# Patient Record
Sex: Male | Born: 1935
Health system: Southern US, Community
[De-identification: ages and names within clinical notes are randomized; demographics above are authoritative.]

## PROBLEM LIST (undated history)

## (undated) DIAGNOSIS — T148XXA Other injury of unspecified body region, initial encounter: Secondary | ICD-10-CM

## (undated) DIAGNOSIS — I739 Peripheral vascular disease, unspecified: Secondary | ICD-10-CM

## (undated) DIAGNOSIS — K2270 Barrett's esophagus without dysplasia: Secondary | ICD-10-CM

## (undated) DIAGNOSIS — D649 Anemia, unspecified: Secondary | ICD-10-CM

## (undated) DIAGNOSIS — F419 Anxiety disorder, unspecified: Secondary | ICD-10-CM

## (undated) DIAGNOSIS — K635 Polyp of colon: Secondary | ICD-10-CM

## (undated) DIAGNOSIS — H269 Unspecified cataract: Secondary | ICD-10-CM

## (undated) DIAGNOSIS — F32A Depression, unspecified: Secondary | ICD-10-CM

## (undated) DIAGNOSIS — M1990 Unspecified osteoarthritis, unspecified site: Secondary | ICD-10-CM

## (undated) DIAGNOSIS — F329 Major depressive disorder, single episode, unspecified: Secondary | ICD-10-CM

## (undated) DIAGNOSIS — I10 Essential (primary) hypertension: Secondary | ICD-10-CM

## (undated) DIAGNOSIS — E7800 Pure hypercholesterolemia, unspecified: Secondary | ICD-10-CM

## (undated) DIAGNOSIS — K449 Diaphragmatic hernia without obstruction or gangrene: Secondary | ICD-10-CM

## (undated) DIAGNOSIS — K5790 Diverticulosis of intestine, part unspecified, without perforation or abscess without bleeding: Secondary | ICD-10-CM

## (undated) DIAGNOSIS — E039 Hypothyroidism, unspecified: Secondary | ICD-10-CM

## (undated) DIAGNOSIS — K219 Gastro-esophageal reflux disease without esophagitis: Secondary | ICD-10-CM

## (undated) DIAGNOSIS — N189 Chronic kidney disease, unspecified: Secondary | ICD-10-CM

## (undated) HISTORY — DX: Unspecified osteoarthritis, unspecified site: M19.90

## (undated) HISTORY — DX: Diaphragmatic hernia without obstruction or gangrene: K44.9

## (undated) HISTORY — PX: PYLOROPLASTY: SHX418

## (undated) HISTORY — DX: Anxiety disorder, unspecified: F41.9

## (undated) HISTORY — DX: Depression, unspecified: F32.A

## (undated) HISTORY — DX: Diverticulosis of intestine, part unspecified, without perforation or abscess without bleeding: K57.90

## (undated) HISTORY — PX: TRANSURETHRAL RESECTION OF PROSTATE: SHX73

## (undated) HISTORY — DX: Unspecified cataract: H26.9

## (undated) HISTORY — DX: Barrett's esophagus without dysplasia: K22.70

## (undated) HISTORY — PX: CATARACT EXTRACTION W/ INTRAOCULAR LENS  IMPLANT, BILATERAL: SHX1307

## (undated) HISTORY — DX: Chronic kidney disease, unspecified: N18.9

## (undated) HISTORY — DX: Anemia, unspecified: D64.9

## (undated) HISTORY — PX: EYE SURGERY: SHX253

## (undated) HISTORY — PX: UPPER GASTROINTESTINAL ENDOSCOPY: SHX188

## (undated) HISTORY — DX: Gastro-esophageal reflux disease without esophagitis: K21.9

## (undated) HISTORY — DX: Major depressive disorder, single episode, unspecified: F32.9

## (undated) HISTORY — PX: CARDIAC CATHETERIZATION: SHX172

## (undated) HISTORY — DX: Other injury of unspecified body region, initial encounter: T14.8XXA

## (undated) HISTORY — DX: Essential (primary) hypertension: I10

## (undated) HISTORY — PX: TONSILLECTOMY: SUR1361

## (undated) HISTORY — PX: POLYPECTOMY: SHX149

## (undated) HISTORY — DX: Polyp of colon: K63.5

## (undated) HISTORY — PX: COLONOSCOPY: SHX174

---

## 1999-07-10 ENCOUNTER — Encounter: Payer: Self-pay | Admitting: Family Medicine

## 1999-07-10 ENCOUNTER — Encounter: Admission: RE | Admit: 1999-07-10 | Discharge: 1999-07-10 | Payer: Self-pay | Admitting: Family Medicine

## 2000-05-22 ENCOUNTER — Encounter: Payer: Self-pay | Admitting: Family Medicine

## 2000-05-22 ENCOUNTER — Encounter: Admission: RE | Admit: 2000-05-22 | Discharge: 2000-05-22 | Payer: Self-pay | Admitting: Family Medicine

## 2001-11-04 ENCOUNTER — Encounter: Admission: RE | Admit: 2001-11-04 | Discharge: 2001-11-04 | Payer: Self-pay | Admitting: Family Medicine

## 2001-11-04 ENCOUNTER — Encounter: Payer: Self-pay | Admitting: Family Medicine

## 2002-01-14 ENCOUNTER — Encounter: Admission: RE | Admit: 2002-01-14 | Discharge: 2002-01-14 | Payer: Self-pay | Admitting: Internal Medicine

## 2002-01-14 ENCOUNTER — Encounter: Payer: Self-pay | Admitting: Internal Medicine

## 2002-04-01 ENCOUNTER — Encounter: Payer: Self-pay | Admitting: Emergency Medicine

## 2002-04-01 ENCOUNTER — Inpatient Hospital Stay (HOSPITAL_COMMUNITY): Admission: EM | Admit: 2002-04-01 | Discharge: 2002-04-02 | Payer: Self-pay | Admitting: Emergency Medicine

## 2004-07-12 ENCOUNTER — Encounter (INDEPENDENT_AMBULATORY_CARE_PROVIDER_SITE_OTHER): Payer: Self-pay | Admitting: Specialist

## 2004-07-12 ENCOUNTER — Inpatient Hospital Stay (HOSPITAL_COMMUNITY): Admission: RE | Admit: 2004-07-12 | Discharge: 2004-07-14 | Payer: Self-pay | Admitting: Urology

## 2005-09-16 ENCOUNTER — Emergency Department (HOSPITAL_COMMUNITY): Admission: EM | Admit: 2005-09-16 | Discharge: 2005-09-16 | Payer: Self-pay | Admitting: Emergency Medicine

## 2005-12-10 ENCOUNTER — Encounter: Admission: RE | Admit: 2005-12-10 | Discharge: 2005-12-10 | Payer: Self-pay | Admitting: Family Medicine

## 2005-12-17 ENCOUNTER — Ambulatory Visit: Payer: Self-pay | Admitting: Internal Medicine

## 2005-12-18 ENCOUNTER — Ambulatory Visit: Payer: Self-pay | Admitting: Internal Medicine

## 2005-12-18 ENCOUNTER — Encounter (INDEPENDENT_AMBULATORY_CARE_PROVIDER_SITE_OTHER): Payer: Self-pay | Admitting: *Deleted

## 2007-03-16 ENCOUNTER — Ambulatory Visit: Payer: Self-pay | Admitting: Internal Medicine

## 2007-04-28 ENCOUNTER — Encounter: Payer: Self-pay | Admitting: Internal Medicine

## 2007-04-28 ENCOUNTER — Ambulatory Visit: Payer: Self-pay | Admitting: Internal Medicine

## 2007-06-09 DIAGNOSIS — M129 Arthropathy, unspecified: Secondary | ICD-10-CM | POA: Insufficient documentation

## 2007-06-09 DIAGNOSIS — K219 Gastro-esophageal reflux disease without esophagitis: Secondary | ICD-10-CM | POA: Insufficient documentation

## 2007-06-09 DIAGNOSIS — I10 Essential (primary) hypertension: Secondary | ICD-10-CM

## 2007-06-09 DIAGNOSIS — D126 Benign neoplasm of colon, unspecified: Secondary | ICD-10-CM

## 2007-06-09 DIAGNOSIS — K449 Diaphragmatic hernia without obstruction or gangrene: Secondary | ICD-10-CM | POA: Insufficient documentation

## 2007-06-09 DIAGNOSIS — K2270 Barrett's esophagus without dysplasia: Secondary | ICD-10-CM

## 2007-06-09 DIAGNOSIS — E039 Hypothyroidism, unspecified: Secondary | ICD-10-CM | POA: Insufficient documentation

## 2007-06-09 DIAGNOSIS — K644 Residual hemorrhoidal skin tags: Secondary | ICD-10-CM

## 2007-06-09 DIAGNOSIS — F329 Major depressive disorder, single episode, unspecified: Secondary | ICD-10-CM

## 2007-06-09 DIAGNOSIS — K5730 Diverticulosis of large intestine without perforation or abscess without bleeding: Secondary | ICD-10-CM | POA: Insufficient documentation

## 2007-06-10 ENCOUNTER — Ambulatory Visit: Payer: Self-pay | Admitting: Internal Medicine

## 2007-10-26 ENCOUNTER — Ambulatory Visit: Payer: Self-pay | Admitting: Internal Medicine

## 2007-10-26 DIAGNOSIS — R079 Chest pain, unspecified: Secondary | ICD-10-CM

## 2007-10-28 ENCOUNTER — Encounter: Payer: Self-pay | Admitting: Internal Medicine

## 2007-10-28 ENCOUNTER — Ambulatory Visit: Payer: Self-pay | Admitting: Internal Medicine

## 2007-10-29 ENCOUNTER — Telehealth: Payer: Self-pay | Admitting: Internal Medicine

## 2007-11-02 ENCOUNTER — Encounter: Payer: Self-pay | Admitting: Internal Medicine

## 2008-03-14 ENCOUNTER — Inpatient Hospital Stay (HOSPITAL_COMMUNITY): Admission: EM | Admit: 2008-03-14 | Discharge: 2008-03-16 | Payer: Self-pay | Admitting: Emergency Medicine

## 2008-03-24 ENCOUNTER — Encounter (INDEPENDENT_AMBULATORY_CARE_PROVIDER_SITE_OTHER): Payer: Self-pay | Admitting: *Deleted

## 2009-02-21 ENCOUNTER — Telehealth: Payer: Self-pay | Admitting: Internal Medicine

## 2009-04-13 ENCOUNTER — Telehealth: Payer: Self-pay | Admitting: Internal Medicine

## 2009-05-04 ENCOUNTER — Ambulatory Visit: Payer: Self-pay | Admitting: Internal Medicine

## 2009-05-04 DIAGNOSIS — R1084 Generalized abdominal pain: Secondary | ICD-10-CM | POA: Insufficient documentation

## 2009-05-04 DIAGNOSIS — R634 Abnormal weight loss: Secondary | ICD-10-CM

## 2009-05-05 LAB — CONVERTED CEMR LAB
BUN: 20 mg/dL (ref 6–23)
Creatinine, Ser: 1.6 mg/dL — ABNORMAL HIGH (ref 0.4–1.5)

## 2009-05-08 ENCOUNTER — Ambulatory Visit: Payer: Self-pay | Admitting: Cardiology

## 2009-05-29 ENCOUNTER — Ambulatory Visit: Payer: Self-pay | Admitting: Internal Medicine

## 2009-05-31 ENCOUNTER — Encounter: Payer: Self-pay | Admitting: Internal Medicine

## 2009-07-12 ENCOUNTER — Ambulatory Visit: Payer: Self-pay | Admitting: Internal Medicine

## 2009-09-06 DIAGNOSIS — T148XXA Other injury of unspecified body region, initial encounter: Secondary | ICD-10-CM | POA: Insufficient documentation

## 2009-10-20 ENCOUNTER — Encounter (INDEPENDENT_AMBULATORY_CARE_PROVIDER_SITE_OTHER): Payer: Self-pay | Admitting: *Deleted

## 2010-02-06 NOTE — Progress Notes (Signed)
Summary: Triage   Phone Note Call from Patient Call back at [phone]   Caller: Patient Call For: Dr. Marina Goodell Reason for Call: Talk to Nurse Summary of Call: Constipation, Lower abd. pain and gas espicially after eating. would like a sooner appt. than first avail 05-04-09 Initial call taken by: Karna Christmas,  April 13, 2009 10:34 AM  Follow-up for Phone Call        Given appt. for 05/04/09 and  put on cx. list Follow-up by: Teryl Lucy RN,  April 13, 2009 10:50 AM

## 2010-02-06 NOTE — Letter (Signed)
Summary: Patient Notice-[facility]  [address] [place], [place] [address]   Phone: [phone]  Fax: [phone]        May 31, 2009 MRN: [id number]    Walter [name] [address] [place], [place]  [address]    Dear Mr. Hillock,  I am pleased to inform you that the biopsies taken during your recent endoscopic examination did not show any evidence of cancer upon pathologic examination.  However,as you know, your biopsies indicate you have a condition known as Barrett's esophagus. Furthermore, the biopsies continued to show low-grade glandular dysplasia. While not cancer, it is pre-cancerous (can progress to cancer) and needs to be monitored with repeat endoscopic examination and biopsies.  Fortunately, it is uncommon that this develops into cancer, but careful monitoring of the condition along with taking your medication as prescribed is very important in reducing the risk of developing cancer.  It is my recommendation that you have a repeat upper gastrointestinal endoscopic examination in ONE year.  Additional information/recommendations:  __Please call [phone] to schedule a return visit in 4-6 weeks to further evaluate your condition, as previously recommended on your       endoscopy report.  __Continue omeprazole 40 mg daily   Please call us if you have or develop heartburn, reflux symptoms, any swallowing problems, or if you have questions about your condition that have not been fully answered at this time.  Sincerely,  Hilarie Fredrickson MD  This letter has been electronically signed by your physician.  Appended Document: Patient Notice-Barrett's Esopghagus letter mailed.

## 2010-02-06 NOTE — Assessment & Plan Note (Signed)
Summary: Abdominal pain, constipation / gas    History of Present Illness Visit Type: Follow-up Visit Primary GI MD: Yancey Flemings MD Primary Provider: Dr. Lupe Carney Requesting Provider: n/a Chief Complaint: consitpation/diarrhea  lower abdominal pain History of Present Illness:   77 year old white male with a history of hypothyroidism, hypertension, depression, gastroesophageal reflux disease complicated by Barrett's esophagus, diverticulosis, and adenomatous colon polyps. He presents today, one year overdue for followup, with new complaints of postprandial abdominal discomfort and weight loss. The patient last underwent complete colonoscopy in December of 2007. Diverticulosis and adenomatous polyp (removed) found. Followup in 5 years recommended. He is also known to have Barrett's esophagus, and has undergone multiple endoscopies due to the presence of low-grade dysplasia and most recently glandular atypia in October 2009. At that time he was instructed to continue her Nexium 40 mg b.i.d. and followup endoscopy in 6 months. He tells me that he had difficulties with insurance related issues and inability to manage his medical bills. As such, he did not respond to the followup letter and went off of his Nexium. He tells me that he takes Prevacid 15 mg daily (over-the-counter) for GERD. He denies heartburn or dysphagia. Currently, he reports that about one hour after meals he started to develop lower abdominal discomfort. This has led to food avoidance. He reports 10 pound weight loss over the past month. Records show that he has had 20 pound weight loss since his last office visit October 2009. Discomfort seems to clear on and eventually wane. No nocturnal symptoms. He also reports alternating bowel habits with constipation and diarrhea. Pain is in the lower abdomen. It's not clear that defecation improves his symptoms.. No back pain.   GI Review of Systems    Reports abdominal pain, acid reflux, and   weight loss.     Location of  Abdominal pain: lower abdomen. Weight loss of 10 pounds over one month.   Denies belching, bloating, chest pain, dysphagia with liquids, dysphagia with solids, heartburn, loss of appetite, nausea, vomiting, vomiting blood, and  weight gain.      Reports change in bowel habits, constipation, diarrhea, and  diverticulosis.     Denies anal fissure, black tarry stools, fecal incontinence, heme positive stool, hemorrhoids, irritable bowel syndrome, jaundice, light color stool, liver problems, rectal bleeding, and  rectal pain.    Current Medications (verified): 1)  Prevacid 15 Mg Cpdr (Lansoprazole) .Marland Kitchen.. 1 By Mouth Once Daily 2)  Synthroid 150 Mcg Tabs (Levothyroxine Sodium) .Marland Kitchen.. 1 By Mouth Once Daily 3)  Flomax 0.4 Mg  Cp24 (Tamsulosin Hcl) .... One Tablet Once Daily 4)  Finasteride 5 Mg Tabs (Finasteride) .Marland Kitchen.. 1 Tablet By Mouth Once Daily 5)  Metoprolol Succinate 25 Mg Xr24h-Tab (Metoprolol Succinate) .Marland Kitchen.. 1 By Mouth Once Daily 6)  Fluoxetine Hcl 20 Mg Caps (Fluoxetine Hcl) .Marland Kitchen.. 1 By Mouth Once Daily 7)  Diclofenac Sodium 75 Mg Tbec (Diclofenac Sodium) .Marland Kitchen.. 1 By Mouth Once Daily  Allergies (verified): 1)  ! Penicillin  Past History:  Past Medical History: Reviewed history from 06/09/2007 and no changes required. Current Problems:  HIATAL HERNIA (ICD-553.3) GERD (ICD-530.81) RESIDUAL HEMORRHOIDAL SKIN TAGS (ICD-455.9) ARTHRITIS (ICD-716.90) DIVERTICULOSIS, COLON (ICD-562.10) COLONIC POLYPS (ICD-211.3) BARRETTS ESOPHAGUS (ICD-530.85) HYPERTENSION (ICD-401.9) DEPRESSION (ICD-311) HYPOTHYROIDISM (ICD-244.9)  Past Surgical History: Reviewed history from 06/09/2007 and no changes required. Larynx surgery  Family History: Reviewed history from 06/10/2007 and no changes required. no family history of gastrointestinal malignancy. Mother with heart disease. Father with melanoma. Brother and sister with  lung cancer. 2 sisters with Alzheimer's.  Social  History: Reviewed history from 06/10/2007 and no changes required. remarried. 2 children from his first marriage. Attend college. Employed. Does not smoke. Drinks alcohol daily.  Review of Systems  The patient denies allergy/sinus, anemia, anxiety-new, arthritis/joint pain, back pain, blood in urine, breast changes/lumps, change in vision, confusion, cough, coughing up blood, depression-new, fainting, fatigue, fever, headaches-new, hearing problems, heart murmur, heart rhythm changes, itching, menstrual pain, muscle pains/cramps, night sweats, nosebleeds, pregnancy symptoms, shortness of breath, skin rash, sleeping problems, sore throat, swelling of feet/legs, swollen lymph glands, thirst - excessive , urination - excessive , urination changes/pain, urine leakage, vision changes, and voice change.    Vital Signs:  Patient profile:   77 year old male Height:      69 inches Weight:      198 pounds BMI:     29.35 Pulse rate:   66 / minute Pulse rhythm:   regular BP sitting:   100 / 54  (left arm)  Vitals Entered By: Chales Abrahams CMA Duncan Dull) (May 04, 2009 11:13 AM)  Physical Exam  General:  Well developed, well nourished, no acute distress. Head:  Normocephalic and atraumatic. Eyes:  PERRLA, no icterus. Nose:  No deformity, discharge,  or lesions. Mouth:  No deformity or lesions, dentition normal. Neck:  Supple; no masses or thyromegaly. Chest Wall:  Symmetrical,  no deformities . Lungs:  Clear throughout to auscultation. Heart:  Regular rate and rhythm; no murmurs, rubs,  or bruits. Abdomen:  Soft, nontender and nondistended. No masses, hepatosplenomegaly or hernias noted. Normal bowel sounds. Rectal:  deferred Msk:  Symmetrical with no gross deformities. Normal posture. Pulses:  Normal pulses noted. Extremities:  No clubbing, cyanosis, edema or deformities noted. Neurologic:  Alert and  oriented x4;  grossly normal neurologically. Skin:  Intact without significant lesions or  rashes. Cervical Nodes:  No significant cervical adenopathy.no supraclavicular adenopathy Psych:  Alert and cooperative. Normal mood and affect.   Impression & Recommendations:  Problem # 1:  ABDOMINAL PAIN -GENERALIZED (ICD-789.07) postprandial normal pain associated with weight loss. Rule out mesenteric ischemia, pancreatic cancer, gastric ulcer.  Plan: #1. Contrast-enhanced CT scan of the abdomen and pelvis to rule out mesenteric ischemia as well as pancreatic neoplasm or other process to explain symptoms.  Problem # 2:  BARRETTS ESOPHAGUS (ICD-530.85) Barrett's esophagus with a history of low-grade dysplasia and most recently atypia. One year overdue for surveillance endoscopy. Weight loss noted, though no dysphagia. Inadequate dose of PPI.  Plan: #1. Prescribe omeprazole 40 mg daily #2. Upper endoscopy with esophageal biopsies. The nature of the procedure as well as the risks, benefits, and alternatives were reviewed. He understood and agreed to proceed. as well, endoscopy to evaluate pain if CT negative  Problem # 3:  WEIGHT LOSS-ABNORMAL (ICD-783.21) weight loss due to food avoidance. Workup as above. Prior history of significant anxiety/depression noted  Problem # 4:  GERD (ICD-530.81) complicated by Barrett's esophagus. Over due for surveillance endoscopy. Inadequate PPI dosage. Plan for surveillance endoscopy and changed to omeprazole 40 mg daily  Problem # 5:  COLONIC POLYPS (ICD-211.3) history of adenomatous colon polyps. Now with change in bowel habits (787.99) as manifested by constipation and diarrhea. Initially due for colonoscopy in December of 2012. However, if workup negative then consider moving colonoscopy up to evaluate change in bowel habits, or abdominal pain, and weight loss.  Other Orders: CT Abdomen/Pelvis w & w/o Contrast (CT A/P w&w/o Cont) EGD (EGD) TLB-BUN (Urea Nitrogen) (  84520-BUN) TLB-Creatinine, Blood (82565-CREA)  Patient Instructions: 1)  EGD  LEC 05/29/09 4:00 pm arrive at 3:00 pm 2)  Upper Endoscopy brochure given.  3)  CT Abdomen with and without contrast Pelvis with contrast 4)  Omeprazole 40 mg #90 x 3 RFS. 5)  Labs ordered for you to have drawn today. 6)  The medication list was reviewed and reconciled.  All changed / newly prescribed medications were explained.  A complete medication list was provided to the patient / caregiver. 7)  printed and given to patient. Milford Cage [facility]  May 04, 2009 12:09 PM 8)  Copy: Dr. Lupe Carney Prescriptions: OMEPRAZOLE 40 MG CPDR (OMEPRAZOLE) 1 by mouth once daily  #90 x 3   Entered by:   Milford Cage NCMA   Authorized by:   Hilarie Fredrickson MD   Signed by:   Milford Cage NCMA on 05/04/2009   Method used:   Electronically to        [facility]* (mail-order)             ,          Ph: [phone]       Fax: [phone]   RxID:   [id number]

## 2010-02-06 NOTE — Procedures (Signed)
Summary: EGD   EGD  Procedure date:  12/18/2005  Findings:      Location: [facility]  Findings: Duodenitis  Hiatal Hernia Barrett's Esophagus [name] Name: Walter, [name]. MRN:  Procedure Procedures: Panendoscopy (EGD) CPT: 43235.    with multiple biopsies. CPT: T6462574. There were greater than 10 biopsies taken.  Personnel: Endoscopist: Wilhemina Bonito. Marina Goodell, MD.  Exam Location: Exam performed in Outpatient Clinic. Outpatient  [name] Consent: Procedure, Alternatives, Risks and Benefits discussed, consent obtained, from [name]. Consent was obtained by the RN.  Indications Symptoms: Dysphagia. Abdominal pain, Reflux symptoms  History  Current Medications: [name] is not currently taking Coumadin.  Pre-Exam Physical: Performed Dec 18, 2005  Cardio-pulmonary exam, HEENT exam, Abdominal exam, Mental status exam WNL.  Comments: Pt. history reviewed/updated, physical exam performed prior to initiation of sedation?yes Exam Exam Info: Maximum depth of insertion Duodenum, intended Duodenum. [name] position: on left side. Vocal cords visualized. Gastric retroflexion performed. Images taken. ASA Classification: II. Tolerance: excellent.  Sedation Meds: [name] assessed and found to be appropriate for moderate (conscious) sedation. Residual sedation present from prior procedure today. Versed 2 mg. given IV. Cetacaine Spray 2 sprays  Monitoring: BP and pulse monitoring done. Oximetry used. Supplemental O2 given  Findings - BARRETT'S ESOPHAGUS: Proximal margin 25 cm from mouth,  distal margin 34 cm. Length of Barrett's 9 cm. Inflammation present. A retroflex image was taken. Biopsy/Barrett's taken. ICD9: Barrett's: 530.2. Comment: 4Q bx x q2cm (total 16 bx).  HIATAL HERNIA: ICD9: Hernia, Hiatal: 553.3. - MUCOSAL ABNORMALITY: Duodenal Bulb. Erosions present. Erythematous mucosa. RUT done, results pending. ICD9: Duodenitis without Hemorrhage: 535.60.    Assessment  Diagnoses: 530.2: Barrett's.  553.3: Hernia, Hiatal.  535.60: Duodenitis without Hemorrhage.   Events  Unplanned Intervention: No unplanned interventions were required.  Unplanned Events: There were no complications. Plans Comments: Continue daily PPI Disposition: After procedure [name] sent to recovery. After recovery [name] sent home.  Scheduling: Call office for appointment, to Wilhemina Bonito. Marina Goodell, MD, in 4 weeks   Comments: Return to Dr. Clovis Riley ASAP to address MULTIPLE nonGI complaints.  This report was created from Walter original endoscopy report, which was reviewed and signed by Walter above listed endoscopist.   cc:  Lupe Carney, MD      Marcelyn Bruins, MD      Walter [name]

## 2010-02-06 NOTE — Letter (Signed)
Summary: EGD Instructions  [facility]  [address] [place], [place] [address]   Phone: [phone]  Fax: [phone]       Walter [name]    Oct 08, 1933    MRN: [id number]       Procedure Day /Date:MONDAY, 05/29/09     Arrival Time: 3:00 PM     Procedure Time:4:00 PM     Location of Procedure:                    [facility] (4th Floor)   PREPARATION FOR ENDOSCOPY   On MONDAY 5/23/11THE DAY OF THE PROCEDURE:  1.   No solid foods, milk or milk products are allowed after midnight the night before your procedure.  2.   Do not drink anything colored red or purple.  Avoid juices with pulp.  No orange juice.  3.  You may drink clear liquids until 2:00 PM, which is 2 hours before your procedure.                                                                                                CLEAR LIQUIDS INCLUDE: Water Jello Ice Popsicles Tea (sugar ok, no milk/cream) Powdered fruit flavored drinks Coffee (sugar ok, no milk/cream) Gatorade Juice: apple, white grape, white cranberry  Lemonade Clear bullion, consomm, broth Carbonated beverages (any kind) Strained chicken noodle soup Hard Candy   MEDICATION INSTRUCTIONS  Unless otherwise instructed, you should take regular prescription medications with a small sip of water as early as possible the morning of your procedure.             OTHER INSTRUCTIONS  You will need a responsible adult at least 75 years of age to accompany you and drive you home.   This person must remain in the waiting room during your procedure.  Wear loose fitting clothing that is easily removed.  Leave jewelry and other valuables at home.  However, you may wish to bring a book to read or an iPod/MP3 player to listen to music as you wait for your procedure to start.  Remove all body piercing jewelry and leave at home.  Total time from sign-in until discharge is approximately 2-3 hours.  You should go home  directly after your procedure and rest.  You can resume normal activities the day after your procedure.  The day of your procedure you should not:   Drive   Make legal decisions   Operate machinery   Drink alcohol   Return to work  You will receive specific instructions about eating, activities and medications before you leave.    The above instructions have been reviewed and explained to me by   _______________________    I fully understand and can verbalize these instructions _____________________________ Date _________

## 2010-02-06 NOTE — Progress Notes (Signed)
Summary: Schedule EGD   Phone Note Outgoing Call Call back at [facility] Phone [phone]   Call placed by: Harlow Mares CMA Duncan Dull),  February 21, 2009 9:06 AM Call placed to: Patient Summary of Call: Left message on patients machine to call back. patient needs to schedule a egd not a colonoscopy. Recall colonoscopy not due until 2012 Initial call taken by: Harlow Mares CMA Duncan Dull),  February 21, 2009 9:07 AM  Follow-up for Phone Call        Left message on patients machine to call back. patient needs EGD only Follow-up by: Harlow Mares CMA Duncan Dull),  March 01, 2009 12:35 PM

## 2010-02-06 NOTE — Letter (Signed)
Summary: Appointment - Missed  [facility], Main Office  [address]   [place], [place] [address]   Phone: [phone]  Fax: [phone]        October 20, 2009 MRN: [id number]      Jacques FERRANTE [address] [place], [place]  [address]     Dear Mr. Helfrich,  Our records indicate you missed your appointment on September 07, 2009 at 3:45pm with Dr. Eden Emms. It is very important that we reach you to reschedule this appointment. We look forward to participating in your health care needs. Please contact us at the number listed above at your earliest convenience to reschedule this appointment.     Sincerely,  Glass blower/designer

## 2010-02-06 NOTE — Assessment & Plan Note (Signed)
Summary: followup post EGD / feeling better    History of Present Illness Visit Type: follow up Primary GI MD: Yancey Flemings MD Primary Provider: Dr. Lupe Carney Requesting Provider: n/a Chief Complaint: f/u EGD. Pt states Dr. Clovis Riley put him on a medicine that he does not know the name of and this has relieved his symptoms completely. History of Present Illness:   77 year old white male with hypothyroidism, hypertension, anxiety/depression, GERD complicated by Barrett's esophagus, diverticulosis, and adenomatous colon polyps. He was evaluated in this office May 04, 2009 regarding abdominal pain and weight loss. He was over due for Barrett surveillance. He had been off PPI. He was prescribed omeprazole 40 mg daily. Upper endoscopy was performed. Biopsies revealed Barrett's with changes of low-grade dysplasia and some specimens. One-year followup plans. CT Scan of the abdomen and pelvis was negative. He saw Dr. Clovis Riley and was placed on dicyclomine. He takes this once a day prior to his evening meal. He states that his abdomen has been feeling better. His appetite has improved. He has gained 5 pounds. No lower GI complaints. He is pleased. He is due for surveillance colonoscopy December 2012. He does tell me that he has had some irregular heartbeats and wishes to see a [place] cardiologist.   GI Review of Systems    Reports acid reflux and  weight gain.      Denies abdominal pain, belching, bloating, chest pain, dysphagia with liquids, dysphagia with solids, heartburn, loss of appetite, nausea, vomiting, vomiting blood, and  weight loss.        Denies anal fissure, black tarry stools, change in bowel habit, constipation, diarrhea, diverticulosis, fecal incontinence, heme positive stool, hemorrhoids, irritable bowel syndrome, jaundice, light color stool, liver problems, rectal bleeding, and  rectal pain.    Current Medications (verified): 1)  Synthroid 150 Mcg Tabs (Levothyroxine Sodium) .Marland Kitchen..  1 By Mouth Once Daily 2)  Flomax 0.4 Mg  Cp24 (Tamsulosin Hcl) .... One Tablet Once Daily 3)  Finasteride 5 Mg Tabs (Finasteride) .Marland Kitchen.. 1 Tablet By Mouth Once Daily 4)  Metoprolol Succinate 25 Mg Xr24h-Tab (Metoprolol Succinate) .Marland Kitchen.. 1 By Mouth Once Daily 5)  Fluoxetine Hcl 20 Mg Caps (Fluoxetine Hcl) .Marland Kitchen.. 1 By Mouth Once Daily 6)  Diclofenac Sodium 75 Mg Tbec (Diclofenac Sodium) .Marland Kitchen.. 1 By Mouth Once Daily 7)  Omeprazole 40 Mg Cpdr (Omeprazole) .Marland Kitchen.. 1 By Mouth Once Daily 8)  Dicyclomine Hcl 10 Mg Caps (Dicyclomine Hcl) .... One Tablet By Mouth Four Times A Day As Needed  Allergies (verified): 1)  ! Penicillin  Past History:  Past Medical History: Reviewed history from 06/09/2007 and no changes required. Current Problems:  HIATAL HERNIA (ICD-553.3) GERD (ICD-530.81) RESIDUAL HEMORRHOIDAL SKIN TAGS (ICD-455.9) ARTHRITIS (ICD-716.90) DIVERTICULOSIS, COLON (ICD-562.10) COLONIC POLYPS (ICD-211.3) BARRETTS ESOPHAGUS (ICD-530.85) HYPERTENSION (ICD-401.9) DEPRESSION (ICD-311) HYPOTHYROIDISM (ICD-244.9)  Past Surgical History: Reviewed history from 06/09/2007 and no changes required. Larynx surgery  Family History: Reviewed history from 06/10/2007 and no changes required. no family history of gastrointestinal malignancy. Mother with heart disease. Father with melanoma. Brother and sister with lung cancer. 2 sisters with Alzheimer's.  Social History: Reviewed history from 06/10/2007 and no changes required. remarried. 2 children from his first marriage. Attend college. Employed. Does not smoke. Drinks alcohol daily.  Review of Systems       The patient complains of heart rhythm changes.  The patient denies allergy/sinus, anemia, anxiety-new, arthritis/joint pain, back pain, blood in urine, breast changes/lumps, change in vision, confusion, cough, coughing up blood, depression-new, fainting, fatigue,  fever, headaches-new, hearing problems, heart murmur, itching, menstrual pain, muscle  pains/cramps, night sweats, nosebleeds, pregnancy symptoms, shortness of breath, skin rash, sleeping problems, sore throat, swelling of feet/legs, swollen lymph glands, thirst - excessive , urination - excessive , urination changes/pain, urine leakage, vision changes, and voice change.    Vital Signs:  Patient profile:   77 year old male Height:      69 inches Weight:      202.25 pounds Pulse rate:   70 / minute Pulse rhythm:   regular BP sitting:   116 / 62  (left arm) Cuff size:   regular  Vitals Entered By: Christie Nottingham CMA Duncan Dull) (July 12, 2009 3:33 PM)  Physical Exam  General:  Well developed, well nourished, no acute distress. Head:  Normocephalic and atraumatic. Eyes:  PERRLA, no icterus. Nose:  No deformity, discharge,  or lesions. Mouth:  No deformity or lesions Lungs:  Clear throughout to auscultation. Heart:  Regular rate and rhythm; no murmurs, rubs,  or bruits. Abdomen:  Soft, nontender and nondistended. No masses, hepatosplenomegaly or hernias noted. Normal bowel sounds. Msk:  Symmetrical with no gross deformities. Normal posture. Pulses:  Normal pulses noted. Extremities:  No  edema or deformities noted. Neurologic:  Alert and  oriented x4 Skin:  Intact without significant lesions or rashes. Psych:  Alert and cooperative. Normal mood and affect.   Impression & Recommendations:  Problem # 1:  ABDOMINAL PAIN -GENERALIZED (ICD-789.07) resolved. May have been secondary to incompletely treated GERD or functional. He can continue dicyclomine p.r.n. Return to this office for recurrent problems.  Problem # 2:  WEIGHT LOSS-ABNORMAL (ICD-783.21) resolved. Differential as above.  Problem # 3:  GERD (ICD-530.81) no active symptoms on omeprazole. Continue omeprazole reflux precautions  Problem # 4:  BARRETTS ESOPHAGUS (ICD-530.85) again with some low-grade dysplasia noted. Continue 40 mg of omeprazole. Keep surveillance interval at one year  Problem # 5:  COLONIC  POLYPS (ICD-211.3) due for followup around December 2012  Problem # 6:  irregular heartbeat the patient states that his wife notices irregular heartbeat well he sleeps. She is concerned and would like him to see a cardiologist. He would like to see a [facility] cardiologist. We will make the referral accordingly  Other Orders: Cardiology Referral (Cardiology)  Patient Instructions: 1)  Referral to [facility] Dr. Theron Arista NIshan-08/09/09 8:15 am. 2)  The medication list was reviewed and reconciled.  All changed / newly prescribed medications were explained.  A complete medication list was provided to the patient / caregiver. 3)  Copy: Dr. Adelene Amas

## 2010-02-06 NOTE — Procedures (Signed)
Summary: Upper Endoscopy  Patient: Walter [name] Note: All result statuses are Final unless otherwise noted.  Tests: (1) Upper Endoscopy (EGD)   EGD Upper Endoscopy       DONE     [facility]     [address]     [place], [place]  [address]           ENDOSCOPY PROCEDURE REPORT           PATIENT:  [name], [name]  MR#:  [id number]     BIRTHDATE:  [birth year], 75 yrs. old  GENDER:  male           ENDOSCOPIST:  Wilhemina Bonito. Eda Keys, MD     Referred by:  Surveillance Program Recall,           PROCEDURE DATE:  05/29/2009     PROCEDURE:  EGD with biopsies     ASA CLASS:  Class II     INDICATIONS:  h/o Barrett's Esophagus (low grade dysplasia on last     EGD 10-2007), abdominal pain, weight loss           MEDICATIONS:   Fentanyl 50 mcg IV, Versed 5 mg IV     TOPICAL ANESTHETIC:  Exactacain Spray           DESCRIPTION OF PROCEDURE:   After the risks benefits and     alternatives of the procedure were thoroughly explained, informed     consent was obtained.  The LB GIF-H180 [id number] endoscope was     introduced through the mouth and advanced to the second portion of     the duodenum, without limitations.  The instrument was slowly     withdrawn as the mucosa was fully examined.     <<PROCEDUREIMAGES>>           7cm segment of Barrett's esophagus was found in the distal     esophagus.No active inflammation, erosions, or nodularity.     Multiple bx every cm for a total of 32 bx (submitted in 4 jars).     Erythema was found in the gastric antrum but, Otherwise the     examination was normal to D2.    Retroflexed views revealed a     small hiatal hernia.    The scope was then withdrawn from the     patient and the procedure completed.           COMPLICATIONS:  None           ENDOSCOPIC IMPRESSION:     1) Barrett's esophagus in the distal esophagus - s/p bx     2) Erythema in the antrum     3) Otherwise normal examination     4) A hiatal hernia     5) Gerd     RECOMMENDATIONS:    1) Await biopsy results to determine follow up time for EGD     2) continue PPI for GERD and Barrett's     3) Call office next 2-3 days to schedule an office appointment     for 4-6 weeks           ______________________________     Wilhemina Bonito. Eda Keys, MD           CC:  The Patient           n.     eSIGNED:   Wilhemina Bonito. Eda Keys at 05/29/2009 04:43 PM  Claborn, Janusz, [id number]  Note: An exclamation mark (!) indicates a result that was not dispersed into the flowsheet. Document Creation Date: 05/29/2009 4:43 PM _______________________________________________________________________  (1) Order result status: Final Collection or observation date-time: 05/29/2009 16:32 Requested date-time:  Receipt date-time:  Reported date-time:  Referring Physician:   Ordering Physician: Fransico Setters [phone]) Specimen Source:  Source: Launa Grill Order Number: [id number] Lab site:   Appended Document: Upper Endoscopy recall     Procedures Next Due Date:    EGD: 05/2010

## 2010-04-19 LAB — CBC
HCT: 33.6 % — ABNORMAL LOW (ref 39.0–52.0)
HCT: 40.5 % (ref 39.0–52.0)
Hemoglobin: 11.8 g/dL — ABNORMAL LOW (ref 13.0–17.0)
Hemoglobin: 12.6 g/dL — ABNORMAL LOW (ref 13.0–17.0)
MCHC: 34.8 g/dL (ref 30.0–36.0)
MCV: 93.6 fL (ref 78.0–100.0)
Platelets: 204 10*3/uL (ref 150–400)
Platelets: 234 10*3/uL (ref 150–400)
RBC: 3.89 MIL/uL — ABNORMAL LOW (ref 4.22–5.81)
RBC: 4.33 MIL/uL (ref 4.22–5.81)
WBC: 7 10*3/uL (ref 4.0–10.5)
WBC: 7.1 10*3/uL (ref 4.0–10.5)

## 2010-04-19 LAB — DIFFERENTIAL
Basophils Relative: 2 % — ABNORMAL HIGH (ref 0–1)
Eosinophils Absolute: 0.2 10*3/uL (ref 0.0–0.7)
Eosinophils Relative: 2 % (ref 0–5)
Lymphs Abs: 1.9 10*3/uL (ref 0.7–4.0)
Monocytes Absolute: 0.5 10*3/uL (ref 0.1–1.0)
Neutro Abs: 5.2 10*3/uL (ref 1.7–7.7)

## 2010-04-19 LAB — BASIC METABOLIC PANEL
Calcium: 8.9 mg/dL (ref 8.4–10.5)
Creatinine, Ser: 1.07 mg/dL (ref 0.4–1.5)
GFR calc Af Amer: >60 mL/min (ref >60)
GFR calc Af Amer: >60 mL/min (ref >60)
GFR calc non Af Amer: >60 mL/min (ref >60)
Potassium: 4.1 mEq/L (ref 3.5–5.1)
Potassium: 4.6 mEq/L (ref 3.5–5.1)
Sodium: 142 mEq/L (ref 135–145)

## 2010-04-19 LAB — CARDIAC PANEL(CRET KIN+CKTOT+MB+TROPI)
CK, MB: 1.8 ng/mL (ref 0.3–4.0)
Relative Index: INVALID (ref 0.0–2.5)
Total CK: 45 U/L (ref 7–232)
Troponin I: <0.01 ng/mL (ref 0.00–0.06)

## 2010-04-19 LAB — COMPREHENSIVE METABOLIC PANEL
Alkaline Phosphatase: 92 U/L (ref 39–117)
BUN: 22 mg/dL (ref 6–23)
CO2: 25 mEq/L (ref 19–32)
Calcium: 9 mg/dL (ref 8.4–10.5)
Chloride: 107 mEq/L (ref 96–112)
Creatinine, Ser: 1.02 mg/dL (ref 0.4–1.5)
GFR calc non Af Amer: >60 mL/min (ref >60)
Glucose, Bld: 111 mg/dL — ABNORMAL HIGH (ref 70–99)
Sodium: 139 mEq/L (ref 135–145)
Total Bilirubin: 0.6 mg/dL (ref 0.3–1.2)
Total Protein: 5.8 g/dL — ABNORMAL LOW (ref 6.0–8.3)

## 2010-04-19 LAB — POCT CARDIAC MARKERS: CKMB, poc: 1.4 ng/mL (ref 1.0–8.0)

## 2010-04-19 LAB — PROTIME-INR
INR: 1.1 (ref 0.00–1.49)
INR: 1.1 (ref 0.00–1.49)
Prothrombin Time: 14.2 seconds (ref 11.6–15.2)

## 2010-04-19 LAB — CK TOTAL AND CKMB (NOT AT ARMC)
CK, MB: 2.6 ng/mL (ref 0.3–4.0)
Relative Index: INVALID (ref 0.0–2.5)

## 2010-04-19 LAB — HEPARIN LEVEL (UNFRACTIONATED)
Heparin Unfractionated: 0.31 IU/mL (ref 0.30–0.70)
Heparin Unfractionated: <0.1 IU/mL — ABNORMAL LOW (ref 0.30–0.70)

## 2010-04-19 LAB — MAGNESIUM: Magnesium: 2.4 mg/dL (ref 1.5–2.5)

## 2010-04-19 LAB — D-DIMER, QUANTITATIVE: D-Dimer, Quant: 0.3 ug/mL-FEU (ref 0.00–0.48)

## 2010-04-19 LAB — LIPID PANEL: Triglycerides: 164 mg/dL — ABNORMAL HIGH (ref <150)

## 2010-05-07 ENCOUNTER — Other Ambulatory Visit: Payer: Self-pay | Admitting: Internal Medicine

## 2010-05-11 ENCOUNTER — Other Ambulatory Visit: Payer: Self-pay | Admitting: Internal Medicine

## 2010-05-11 MED ORDER — OMEPRAZOLE 40 MG PO CPDR: 40.0000 mg | DELAYED_RELEASE_CAPSULE | Freq: Every day | ORAL | Status: DC

## 2010-05-11 NOTE — Telephone Encounter (Signed)
Rx Sent to pharmacy.  

## 2010-05-22 NOTE — Cardiovascular Report (Signed)
Walter [name], Walter [name]                 ACCOUNT NO.:  [id number]   MEDICAL RECORD NO.:  [id number]          PATIENT TYPE:  INP   LOCATION:  2807                         FACILITY:  [facility]   PHYSICIAN:  Nanetta Batty, M.D.   DATE OF BIRTH:  Jul 28, 1933   DATE OF PROCEDURE:  03/15/2008  DATE OF DISCHARGE:                            CARDIAC CATHETERIZATION   INDICATIONS:  Mr. Pore is a 77 year old gentleman, previously patient  of Dr. Lenise Herald who was admitted yesterday because of chest  tightness on exertion.  Based on positive risk factors, there has been a  mild evidence for bradycardia.  He had a negative stress test done on  January 06, 2006.  He was ruled out for myocardial infarction.  He  presents now for diagnostic coronary arteriography, to define his  anatomy, and rule out an ischemic etiology.   DESCRIPTION OF PROCEDURE:  The patient was brought to the Second Floor  [facility] in the postabsorptive state.  He was  premedicated with p.o. Valium.  His right groin was prepped and shaved  in the usual sterile fashion.  Xylocaine 1% was used for local  anesthesia.  A 6-French sheath was inserted into the right femoral  artery using standard Seldinger technique.  A 6-French right-left  Judkins diagnostic catheter as well as 6-French pigtail catheter were  used for selective coronary angiography, the left ventriculography  respectively.  Visipaque dye was used for the entirety of the case.  Thoracic aorta, left ventricular, and pullback pressures were recorded.   HEMODYNAMIC RESULTS:  1. Aortic systolic pressure 177, diastolic pressure 81.  2. Left ventricular systolic pressure 178, end-diastolic pressure 17.   SELECTIVE CHOLANGIOGRAPHY:  1. Left main; normal.  2. LAD; the LAD had a 30% segmental proximal stenosis.  3. Left circumflex; small, nondominant, and free of significant      disease.  4. Right coronary artery; dominant vessel and free of  significant      disease.  5. Left ventriculography; RAO left ventriculogram was performed using      25 mL of Visipaque dye at 12 mL per second.  The overall LVEF was      estimated approximately 55% without focal wall motion      abnormalities.   IMPRESSION:  Mr. Kloster has essentially normal coronary arteries and  normal left ventricular function.  I am not sure the etiology was chest  pain, but I do think it is ischemic in nature.  He was bradycardic  during the case and his negative chronotropes were held on admission.  We will follow his heart rate and blood pressure and continued medical  therapy will be recommended.   Sheath was removed and pressure was held on the groin to achieve  hemostasis.  The patient left the lab in stable condition.      Nanetta Batty, M.D.  Electronically Signed     JB/MEDQ  D:  03/15/2008  T:  03/15/2008  Job:  [id number]   cc:   Second [facility]  [facility]  Elsworth Soho, M.D.

## 2010-05-22 NOTE — Assessment & Plan Note (Signed)
[facility]                         GASTROENTEROLOGY OFFICE NOTE   NAME:Walter [name], Walter [name]                        MRN:          [id number]  DATE:03/16/2007                            DOB:          11-11-1933    HISTORY:  This is a 77 year old white male with a history of  hypothyroidism, depression, and hypertension, who was evaluated in the  office on one occasion with innumerable complaints.  See that dictation  for details.  He was felt to have worsening reflux disease with  dysphagia, constipation with bloating, abdominal discomfort, likely due  to either, and significant anxiety with depression.  Also multiple non-  GI complaints.  He was initiated on proton pump inhibitor therapy and  given a limited supply of Xanax, and scheduled for colonoscopy and upper  endoscopy, which were performed on December 18, 2005.  The complete  colonoscopy revealed multiple colon polyps and diverticulosis.  The  ileum was normal.  Colon polyps were found to be adenomatous.  Followup  in five years recommended.  Upper endoscopy revealed Barrett's  esophagus.  He had a 9 cm segment.  Biopsies confirmed Barrett's.  He  was found to have low grade dysplasia.  He was to continue on daily  proton pump inhibitor and have repeat endoscopy in six months.  The  patient did receive a letter at his home regarding scheduling follow-up  endoscopy.  However, he failed to do so.  Also, he failed to follow up  after his initial procedures, as recommended.  This is the first time he  has been seen since December, 2007.   His chief complaint today is that of a growth on the inner portion of  the rectum.  He is concerned.  Wants me to evaluate.  He notices this  when he is taking a shower.  It also should be noted that the patient  stopped his proton pump inhibitor after a short while.  He states that  he thought maybe it was causing some lower abdominal discomfort.  So  again, he has  been off of his proton pump inhibitor.  Understandably, he  has had recurrent indigestion and heartburn.  No dysphagia.  His bowel  habits are okay without bleeding.   CURRENT MEDICATIONS:  1. Prozac 20 mg daily.  2. Synthroid 135 mcg daily.  3. Unspecified medicine for arthritis.  4. Multivitamin.  5. Ibuprofen p.r.n.   PHYSICAL EXAMINATION:  A well-appearing male in no acute distress.  Blood pressure is 184/92.  Heart rate 60.  Weight is 211 pounds.  HEENT:  Sclerae are anicteric.  LUNGS:  Clear.  HEART:  Regular.  ABDOMEN:  Soft without tenderness, mass, or hernia.  Good bowel sounds  heard.  RECTAL:  No external abnormalities.  There is a small tag that is  palpated internally.  He states that this is consistent with the defect  that he has noticed.  Stool is brown, soft, hemoccult negative.   IMPRESSION:  1. Benign anal tag.  2. Gastroesophageal reflux disease complicated by Barrett's esophagus      with low grade  dysplasia.  The patient has been off proton pump      inhibitor therapy and is overdue for his follow-up endoscopy.  The      significance of Barrett's and the importance of medical therapy and      close surveillance  were clearly stressed. As well, as literature      provided.  3. History of adenomatous polyps.  4. History of diverticulosis.  5. Multiple general medical problems.   RECOMMENDATIONS:  1. Resume proton pump inhibitor therapy.  I have provided him with six      weeks of Nexium samples.  As well, I have listed the name of each      available proton pump inhibitor so that he can check with his      insurer to find the one that is most cost effective. He will      require long term therapy.  2. Repeat endoscopy with biopsies in about six weeks.  I chose not to      perform the exam sooner because he has been off therapy, which may      increase the chance of reactive atypia on biopsies.  3. Reflux precautions.  4. Surveillance colonoscopy in 2012  is planned.  5. Ongoing general medical care with Dr. Clovis Riley.     Wilhemina Bonito. Marina Goodell, MD  Electronically Signed    JNP/MedQ  DD: 03/16/2007  DT: 03/17/2007  Job #: [id number]   cc:   L. Lupe Carney, M.D.

## 2010-05-22 NOTE — Discharge Summary (Signed)
NAMEJERAD, Walter [name]                 ACCOUNT NO.:  [id number]   MEDICAL RECORD NO.:  [id number]          PATIENT TYPE:  INP   LOCATION:  2039                         FACILITY:  [facility]   PHYSICIAN:  Antonieta Iba, MD   DATE OF BIRTH:  18-Oct-1933   DATE OF ADMISSION:  03/14/2008  DATE OF DISCHARGE:  03/16/2008                               DISCHARGE SUMMARY   DISCHARGE DIAGNOSES:  1. Chest pain, shortness of breath, noncardiac, i.e. nonischemic chest      pain.      a.     Cardiac catheterization with nonobstructive coronary       disease, 30% left anterior descending, otherwise patent coronary       arteries.      b.     Normal left ventricular function.      c.     Negative myocardial infarction.  2. Complaints of arrhythmias with no tachycardia noted on monitor      during hospitalization.      a.     The patient at times is bradycardic on Lopressor 25 b.i.d.       His heart rate dropped down to 50, 49, and Lopressor was       decreased.      b.     The patient recently taken off Verapamil secondary to       decreased heart rate, but please note the patient had doubled up       on the Verapamil for several days as well.  He is not always       compliant with medications.  3. Hypertension.  4. Groin hematoma, level I.  5. Hypothyroidism, treated.   DISCHARGE CONDITION:  Improved.   DISCHARGE MEDICATIONS:  1. Synthroid 150 mcg daily.  2. Finasteride 5 mg daily.  3. Xanax 0.5 mg p.r.n.  4. Flomax 0.4 mg daily.  5. Ibuprofen 400 mg daily.  6. Fluoxetine 20 mg daily.  The patient has stopped this months ago,      so I am instructing him not to take.  7. Initially patient stated he was on Nexium 40 daily, now he states      he is on Prevacid at home.  He will continue either one, but he      should continue a PPI, either a Nexium 40 mg daily or Prevacid      twice a day.  8. Multivitamin daily.  9. Toprol-XL 25 mg half a tablet daily, prescription given.  10.Continue Norvasc  5 mg daily.  11.Avapro 150 mg daily.   DISCHARGE INSTRUCTIONS:  1. Low-sodium, heart-healthy diet.  2. Increase activity slowly.  3. May shower.  4. No lifting for 2 days.  5. No driving for 2 days.  6. Wash cath site with soap and water.  Call if any bleeding,      swelling, or drainage.  7. Follow up with Dr. Elsie Lincoln, March 28, 2008 at 3:30 p.m.  8. Ibuprofen can irritate the stomach causing pain, so only take if      needed.  He had been  taking on a daily basis and on the fluoxetine      only take as prescribed but if he waited too long between doses, do      not restart.   HISTORY OF PRESENT ILLNESS:  A 77 year old white married male with  negative Myoview in the past presented to the emergency room secondary  to increasing chest pressure and shortness of breath.  He had recently  seen Dr. Elsie Lincoln for feeling weak and tired.  He was found to be  bradycardic, but he taken his Verapamil.  He had doubled up on it by  accident and with the bradycardia, Verapamil was discontinued and the  patient was placed on Norvasc.  He was placed on a Holter monitor but  starting on Saturday, March 05, 2008, he was having increasing  weakness, extreme shortness of breath with any movement, tightness  across his chest, felt like he had weight on both shoulders.  No nausea  or diaphoresis.  The rest would help, though on the day of admission,  March 14, 2008, he got up at 4 in the morning to take a shower and he  felt like he might not make it out of the shower.  He had near syncope,  so he was instructed to come to the emergency room where he continued  with chest tightness and weight on his shoulders.   PAST MEDICAL HISTORY:  Negative nuclear study in 2007.  EF was 63%.  The  patient has a history of BPH, tonsillectomy, bilateral cataract surgery,  and nasal surgery.  He has also hypothyroidism.  The patient has a  history of TURP, depression, and reflux disease.  He was seen at [facility] with lower abdominal pain, felt to be diverticular and  rusty-colored urine felt to be kidney stone in the recent past.   FAMILY HISTORY/SOCIAL HISTORY/REVIEW OF SYSTEMS:  See H and P.   ALLERGIES:  He has an intolerance to ACE INHIBITORS that caused a cough  in the past.   PHYSICAL EXAMINATION ON DISCHARGE:  VITAL SIGNS:  Blood pressure 130/81,  pulse 60, afebrile.  ABDOMEN:  Right groin cath site stable with no further bleeding.  Hematoma resolved and 2+ right pedal pulse.  CARDIAC:  No arrhythmias noted except occasional bradycardia.   LABORATORY VALUES:  Hemoglobin 14.1 and hematocrit 40.5.  Prior to  discharge 11.8 hemoglobin, hematocrit 33.6, WBC 7.1, and platelets 187.  Chemistry:  Sodium 142, potassium 4.1, chloride 111, CO2 24, BUN 17,  creatinine 1.05, and glucose 128.  Coags:  Protime 14.2, INR of 1.1, PTT  32, on heparin he was 0.46.  LFTs:  AST 21, ALT 19, alkaline phos 92,  total bili 0.6, and albumin 3.3.  Cardiac enzymes were all negative.  CKs 75-45, MB 2.6-1.8, and troponin I 0.01 to less than 0.01.   Total cholesterol 207, LDL was 143, HDL 31, and triglycerides 161.  Calcium 8.9, magnesium 2.4, and D-dimer was negative at 0.30.  TSH was  4.588.  BNP was 51.  Chest x-ray:  No active lung disease.  No change in  hyperaeration and peribronchial thickening.   HOSPITAL COURSE:  The patient was admitted after experiencing weakness,  near syncope, and chest pain to rule out myocardial infarction.  He was  wearing a monitor on admission and if he still has it, we will keep it  on him as we have never documented any tachycardias that his wife feels  he have.  The  patient's medicine had recently been changed from  Verapamil to Norvasc due to bradycardia and here in the hospital with  even Lopressor 25 twice a day, he did develop bradycardia down to 49.  The patient had no tachycardia here in the hospital, but he did rule out  for myocardial infarction, underwent  cardiac cath, and was found to have  nonobstructive disease of 30% LAD, otherwise normal course and normal  EF.  He did develop some bleeding on his right groin cath site post  procedure which had resolved after holding more pressure and no  significant drop in hemoglobin or hematocrit.  By the next  day, he was ambulating in the hall without complaints.  He had no  further arrhythmias except mild bradycardia.  We will monitor him.  We  put him on little Toprol to prevent the tachycardia and he will follow  up with Dr. Elsie Lincoln as previously instructed.      Darcella Gasman. Annie Paras, N.P.      Antonieta Iba, MD  Electronically Signed    LRI/MEDQ  D:  03/16/2008  T:  03/17/2008  Job:  [id number]   cc:   Madaline Savage, M.D.  Elsworth Soho, M.D.

## 2010-05-22 NOTE — H&P (Signed)
Walter [name], Walter [name]                 ACCOUNT NO.:  [id number]   MEDICAL RECORD NO.:  [id number]          PATIENT TYPE:  INP   LOCATION:  2928                         FACILITY:  [facility]   PHYSICIAN:  Antonieta Iba, MD   DATE OF BIRTH:  1933/01/12   DATE OF ADMISSION:  03/14/2008  DATE OF DISCHARGE:                              HISTORY & PHYSICAL   CHIEF COMPLAINT:  Chest tightness across his chest with weight on both  shoulders and at times in his arms and shortness of breath, rest  relieves his discomfort also irregular heartbeat episodically.   HISTORY OF PRESENT ILLNESS:  A 77 year old white married male with  negative Myoview in the past.  No cardiac catheterization, has been  having issues for most of March.  Couple weeks ago, he accidentally took  verapamil 240 two at a time instead of one at a time and his heart rate  did slow.  Due to the bradycardia and the patient's weakness symptoms,  his verapamil was discontinued and he was placed on Norvasc.  He also  received a Holter monitor but starting this past Saturday, March 05, 2008, he has had increased weakness, extreme shortness of breath with  any movement and tightness across his chest, feels like he has weights  on both shoulders.  No nausea or diaphoresis.  The rest helps and at  4:15 this morning, he got up to take a shower he did not feel well, he  felt like he could make it out of the shower, near-syncope is how he  described, now he did have the chest tightness and the weight on the  shoulders family.   PAST MEDICAL HISTORY:  Negative nuclear study in 2070 is 63%.  He has  BPH, tonsillectomy, bilateral cataract surgery in the past and on nasal  surgery in the past history of hypothyroidism.  He has had a TURP,  depression. and reflux disease.  Additionally he was recently seen [facility] with lower abdominal pain felt to be diverticula and  rusty-colored urine felt to be kidney stone that has  cleared.   FAMILY HISTORY:  Mother died with coronary artery disease.  Father died  when melanoma.  He has 2  siblings that died of lung cancer.  Two  sisters with soft, disease.   SOCIAL HISTORY:  Married, has 2 children and 2 stepchildren, retired;  remote tobacco use; and occasional alcohol.   ALLERGIES:  To penicillin he is also called in the past.   OUTPATIENT MEDICINES:  1. Synthroid 125 mcg daily.  2. Alprazolam 0.5 mg p.r.n.  3. Nexium 40 mg daily.  4. Ibuprofen 100 p.o. b.i.d.  5. Multivitamin daily.  6. Finasteride daily.  7. Flomax daily.  8. Questionable fluoxetine 20 mg daily and we will review again.   REVIEW OF SYSTEMS:  GENERAL:  No colds or fevers.  Skin: No rashes.  HEENT:  No blurred vision.  GI:  Recent diverticula as stated.  GU:  Recent rust-colored urine and questionable kidney stones.  CARDIOVASCULAR:  See HPI.  PULMONARY:  See  HPI.  ENDOCRINE:  No  diabetes.  Positive for thyroid disease.  MUSCULOSKELETAL:  He has  musculoskeletal pain.   PHYSICAL EXAMINATION:  VITAL SIGNS:  Today, blood pressure 138/68, pulse  71, respiratory rate is 18, temperature 97.7, and oxygen saturation on  room air 96%.  GENERAL:  Alert and oriented white male, in no acute distress.  HEENT:  Normocephalic.  Sclera clear.  NECK:  Supple.  No JVD.  No bruits.  No thyromegaly.  LUNGS:  Clear without rales, rhonchi, or wheezes.  HEART:  Sounds S1 and S2.  Regular rate and rhythm without obvious  murmur, gallop, or click.  ABDOMEN:  Soft and nontender.  Positive bowel sounds.  Do not palpate  liver, spleen, or masses.  EXTREMITIES:  Lower Extremities without edema.  Pedal pulses and  posterior tibialis are 2+.  SKIN:  No rashes or bruising.  NEUROLOGIC:  Alert and oriented x3.  Follow commands.   LABORATORIES:  Pending.  Chest x-ray 2-view, no active lung disease.  No  change in hyperaeration and peribronchial thickening.  EKG, sinus rhythm  without acute changes.   1.  Crescendo angina.  2. Hypertension.  3. Recent bradycardia with change in verapamil to Norvasc.  4. Now tachycardia, episodically.   PLAN:  Admit and monitor heart rate to see if he has some underlying  flutter, off verapamil, IV heparin, IV nitroglycerin, and serial CK MBs;  probable heart cath in the morning.       Darcella Gasman. Annie Paras, N.P.      Antonieta Iba, MD  Electronically Signed    LRI/MEDQ  D:  03/14/2008  T:  03/15/2008  Job:  [id number]   cc:   Madaline Savage, M.D.  Elsworth Soho, M.D.

## 2010-05-25 NOTE — Assessment & Plan Note (Signed)
[facility]                         GASTROENTEROLOGY OFFICE NOTE   NAME:Walter [name], Walter [name]                        MRN:          [id number]  DATE:12/17/2005                            DOB:          1933-04-11    The patient is self referred.   REASON FOR CONSULTATION:  Innumerable complaints.   HISTORY:  This is a 77 year old white male with a history of  hypothyroidism, depression, and recently diagnosed hypertension who has  had this appointment scheduled by his wife for evaluation of innumerable  complaints.  According to the patient's wife (who accompanies him and  does most of the speaking) and the patient was in his usual state of  health until about four weeks ago, when he began to feel very ill.  The number of complaints are too numerable to list and his review of  systems is entirely positive.  It appears though that the main  complaints are generalized malaise, weakness, nausea, anxiety, and  depression.  In addition, post prandial abdominal pain on the background  of chronic indigestion, heartburn, regurgitation, and intermittent  dysphagia.  Also problems with cough and headache.  Also, complains of  difficulty with urination and constipation.  No melena or hematochezia.  He has had a 10-pound weight loss in the past month.  They report to me  that he saw Dr. Clovis Riley on two occasions.  During his initial  evaluation, they state that he found nothing and was treated with  laxatives.  On a followup visit, he underwent what sounds like plain  films of the abdomen which were okay and prescribed a stomach  medicine.  In addition to the above listed complaints, he apparently  has had difficulty sleeping, some shaking.  They state that Dr. Clovis Riley  was to schedule a colonoscopy and other tests but never did.  Thus,  she took the liberty of making this appointment in addition to  scheduling him to see her cardiologist, Dr. Jenne Campus.   PAST MEDICAL  HISTORY:  1. Hypothyroidism.  2. Depression.  3. Recently diagnosed hypertension.   PAST SURGICAL HISTORY:  1. Unspecified prostate surgery.  2. Cataract surgery.  3. Unspecified laryngeal surgery 30 years ago.   FAMILY HISTORY:  No family history of gastrointestinal malignancy.  Mother with heart disease.  Father with melanoma.  Brother and sister  with lung cancer as well as two sisters with Alzheimer's.  His wife  points out to me that all members of his family, except for his mother  died at age 39.  She reminds me that the patient is 41.   SOCIAL HISTORY:  The patient is remarried.  He has 2 children from his  first marriage.  His current wife also has 2 children from her previous  marriage.  He took some college courses.  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  He does not smoke,  though he did years ago.  He drinks several glasses of wine every  evening.   REVIEW OF SYSTEMS:  Entirely positive.  See diagnostic evaluation  form  for embellishment.   PHYSICAL EXAMINATION:  GENERAL:  Somewhat depressed appearing male in no  acute distress.  He is alert and oriented.  VITAL SIGNS:  Blood pressure is 150/84, heart rate is 80, weight is 207  pounds.  He is 5 feet 9.5 inches in height.  HEENT:  Sclerae are anicteric.  Conjunctivae are pink.  Oral mucosa is  intact.  There is no adenopathy.  LUNGS:  Clear.  HEART:  Regular.  ABDOMEN:  Slightly obese, soft, without tenderness, mass, or hernia.  Good bowel sounds heard.  RECTAL:  Deferred.  EXTREMITIES:  Without edema.   IMPRESSION:  1. Chronic worsening gastroesophageal reflux disease with intermittent      dysphagia rule out peptic stricture,esophagitis,or Barrett's.  2. Post prandial abdominal discomfort possibly due to reflux disease,      possibly due to constipation, rule out other pathology such as      ulcer, hepatobiliary disorders, or neoplasm (doubt).  3. Constipation and  bloating,subjectively.  4. Anxiety with depression, appears to be a significant part of his      current problems.  5. Multiple non-gastrointestinal complaints including headache, cough,      difficulty urinating, anxiety, and depression.   RECOMMENDATIONS:  1. Initiate proton pump inhibitor therapy, samples as well as a      prescription with multiple refills has been provided.  2. Prescribe a limited supply of Xanax 0.5 mg, #30, without refills.      This is for anxiety and to help with bedtime rest.  3. Schedule a colonoscopy and upper endoscopy to evaluate upper GI      symptoms.  The nature of the procedures as well as the risks,      benefits, and alternatives have been reviewed.  He understood and      agreed to proceed.  We will set up the exams for tomorrow in order      to expedite the workup.  4. Return immediately to the care of Dr. Clovis Riley, regarding multiple      non-GI complaints.  5. Follow up with his urologist for urinary difficulties.     Wilhemina Bonito. Marina Goodell, MD  Electronically Signed   JNP/MedQ  DD: 12/17/2005  DT: 12/17/2005  Job #: [id number]   cc:   L. Lupe Carney, M.D.  Jamison Neighbor, M.D.

## 2010-05-25 NOTE — Op Note (Signed)
Walter [name], Walter [name]                 ACCOUNT NO.:  [id number]   MEDICAL RECORD NO.:  [id number]          PATIENT TYPE:  INP   LOCATION:  1419                         FACILITY:  [facility]   PHYSICIAN:  Jamison Neighbor, M.D.  DATE OF BIRTH:  1933/02/22   DATE OF PROCEDURE:  07/12/2004  DATE OF DISCHARGE:                                 OPERATIVE REPORT   PREOPERATIVE DIAGNOSIS:  Retention.   POSTOPERATIVE DIAGNOSIS:  Retention.   PROCEDURE:  1.  Cystourethroscopy.  2.  TURP.   SURGEON:  Jamison Neighbor, M.D.   ASSISTANT:  Glade Nurse, M.D.   ANESTHESIA:  General endotracheal.   SPECIMENS:  Prostate chips.   INDICATIONS FOR PROCEDURE:  This is a 77 year old gentleman who presented in  consultation for acute urinary retention. For remainder of history and  physical, please see admission H&P. After reviewing his options, he has  elected to proceed with surgical management.   DESCRIPTION OF PROCEDURE:  The patient was identified by his wrist bracelet  and brought to room 10 where he received preoperative antibiotics and was  administered general anesthesia. He was prepped and draped in the usual  sterile fashion. Next, his anterior urethra was dilated to 30 [place] with  [employer]. Following this, a 35 French resectoscope was inserted into  the patient's bladder and excellent clear urine output was obtained. Next,  we inserted our resectoscope. Using glycine irrigant, we examined the  patient's bladder systemically which revealed mild trabeculation with the  ureteral orifice in their normal anatomic position effluxing clear urine  bilaterally. There were no mucosal abnormalities or foreign bodies. The  patient's prostatic urethra was somewhat short in length with a somewhat  narrowed bladder neck. Next using standard TUR settings, we transurethrally  resected the prostate gland. We started by making a trough at the 6 o'clock  position from the bladder neck to the proximal  aspect of the verumontanum.  We then dissected from 6 o'clock to the 11 o'clock position bilaterally  maintaining excellent hemostasis as we went. We were careful not to migrate  distally towards the membranous urethra. When we determined an adequate  amount of tissue had been resected, we then evacuated the prostatic chips.  The bladder was reinspected, ureteral orifice were found to be intact and  not involved in the resection. Excellent hemostasis was  ensured. The resectoscope was removed. A 24 French three-way catheter was  inserted and connected to continuous bladder irrigation. The patient was  reversed from his anesthesia without incident. Please note Jamison Neighbor,  M.D. was present and participated in this entire case.       MT/MEDQ  D:  07/12/2004  T:  07/12/2004  Job:  [id number]

## 2010-05-25 NOTE — H&P (Signed)
NAMECAYNE, Walter [name]                 ACCOUNT NO.:  [id number]   MEDICAL RECORD NO.:  [id number]          PATIENT TYPE:  INP   LOCATION:  Z610                         FACILITY:  [facility]   PHYSICIAN:  Jamison Neighbor, M.D.  DATE OF BIRTH:  [birth year]   DATE OF ADMISSION:  07/12/2004  DATE OF DISCHARGE:                                HISTORY & PHYSICAL   ADMISSION DIAGNOSIS:  Benign prostatic hypertrophy with urinary retention.   HISTORY:  This 77 year old male presented to the office earlier this week,  noting that he was having a difficult time urinating.  The patient was found  to be in acute urinary retention.  He had some problems with lower abdominal  pain.  It was relieved, at least in part by emptying his bladder.  The  patient still had some issues and problems with constipation that caused  pain that he felt was so severe it brought me to my knees.  He does feel  after a bowel movement that he feels better.  He is now to be admitted  following a TURP for the management of his chronic urinary retention.   PAST MEDICAL HISTORY:  Relatively unremarkable.   The patient's only medications are thyroid extract as well as Prozac 20 mg.   PAST SURGICAL HISTORY:  1.  Tonsillectomy over 27 years ago.  2.  He has had bilateral cataract surgery.  3.  He had nasal surgery in the distant past as well.   The patient is known to be hypothyroid and is on thyroid extract.  He has no  chronic other medical illnesses.   The patient's social history is unremarkable.  He does not use tobacco in  the past 25 years.  He drinks moderate amounts of alcohol.   His review of systems is noncontributory aside from the urinary retention.  He denies any other longstanding cardiac, pulmonary, musculoskeletal,  neurologic, endocrine, vascular, gastrointestinal, dermatologic, urologic,  or psychological disorders.   PHYSICAL EXAMINATION:  VITAL SIGNS:  Temperature 97.9, pulse 69, respiratory  rate 16,  blood pressure 158/82.  GENERAL:  Patient is a well-developed and well-nourished male in no acute  distress.  HEENT:  Normocephalic and atraumatic.  Cranial nerves II-XII are grossly  intact.  Patient has one missing cap on a back tooth, but otherwise  dentition is all intact.  NECK:  Supple with no adenopathy or thyromegaly.  LUNGS:  Clear.  HEART:  Regular rate and rhythm.  No murmurs, thrills, gallops, rubs, or  heaves.  ABDOMEN:  Soft and nontender.  No palpable masses, rebound or guarding.  GENITOURINARY:  Normal testicles bilaterally.  There is no hydrocele,  spermatocele, varicocele, or hernia anomaly.  He had no flank mass or  tenderness.  BACK:  His spine was straight.  EXTREMITIES:  No clubbing, cyanosis or edema.  NEUROLOGIC:  Intact.  VASCULAR:  Intact.  SKIN:  Normal.  RECTAL:  A 2+ benign-feeling prostate.  No real signs of prostatitis or  cancer on rectal exam.   Because the patient has urinary retention and really wants to have something  definitive done, he has requested that a TURP be performed.  He does know  about other options such as aggressive medical management with alpha blocks  and 5-alpha reductase inhibitors.  He is also aware of minimally invasive  options that can be considered for the office.  His concern is that he wants  to have a definitive procedure done as quickly as possible, and for that  reason, will undergo an elective TURP.  He will be admitted following that  procedure.     _______________    RJE/MEDQ  D:  07/12/2004  T:  07/12/2004  Job:  [id number]   cc:   L. Lupe Carney, M.D.  [address] [place]  [place] [address]  Fax: [phone]

## 2010-06-16 ENCOUNTER — Encounter: Payer: Self-pay | Admitting: Internal Medicine

## 2010-06-20 ENCOUNTER — Encounter: Payer: Self-pay | Admitting: Internal Medicine

## 2010-06-29 ENCOUNTER — Ambulatory Visit (AMBULATORY_SURGERY_CENTER): Payer: Medicare Other | Admitting: *Deleted

## 2010-06-29 VITALS — Ht 70.0 in | Wt 204.3 lb

## 2010-06-29 DIAGNOSIS — K2270 Barrett's esophagus without dysplasia: Secondary | ICD-10-CM

## 2010-07-12 ENCOUNTER — Ambulatory Visit (AMBULATORY_SURGERY_CENTER): Payer: Medicare Other | Admitting: Internal Medicine

## 2010-07-12 ENCOUNTER — Encounter: Payer: Self-pay | Admitting: Internal Medicine

## 2010-07-12 VITALS — BP 142/68 | HR 47 | Temp 97.4°F | Resp 12 | Ht 66.0 in | Wt 204.0 lb

## 2010-07-12 DIAGNOSIS — K2270 Barrett's esophagus without dysplasia: Secondary | ICD-10-CM

## 2010-07-12 DIAGNOSIS — K219 Gastro-esophageal reflux disease without esophagitis: Secondary | ICD-10-CM

## 2010-07-12 DIAGNOSIS — D379 Neoplasm of uncertain behavior of digestive organ, unspecified: Secondary | ICD-10-CM

## 2010-07-12 MED ORDER — SODIUM CHLORIDE 0.9 % IV SOLN: 500.0000 mL | INTRAVENOUS | Status: DC

## 2010-07-12 NOTE — Patient Instructions (Signed)
Follow discharge instructions.  Continue the Omeprazole  Repeat EGD in one year.

## 2010-07-13 ENCOUNTER — Telehealth: Payer: Self-pay | Admitting: *Deleted

## 2010-07-13 ENCOUNTER — Telehealth: Payer: Self-pay | Admitting: Internal Medicine

## 2010-07-13 NOTE — Telephone Encounter (Signed)

## 2010-07-13 NOTE — Telephone Encounter (Signed)
Patient is scheduled to see Dr Marina Goodell for 08/06/10

## 2010-08-03 ENCOUNTER — Encounter: Payer: Self-pay | Admitting: *Deleted

## 2010-08-06 ENCOUNTER — Ambulatory Visit (INDEPENDENT_AMBULATORY_CARE_PROVIDER_SITE_OTHER): Payer: Medicare Other | Admitting: Internal Medicine

## 2010-08-06 ENCOUNTER — Encounter: Payer: Self-pay | Admitting: Internal Medicine

## 2010-08-06 DIAGNOSIS — R197 Diarrhea, unspecified: Secondary | ICD-10-CM

## 2010-08-06 DIAGNOSIS — Z86010 Personal history of colonic polyps: Secondary | ICD-10-CM

## 2010-08-06 DIAGNOSIS — R1084 Generalized abdominal pain: Secondary | ICD-10-CM

## 2010-08-06 DIAGNOSIS — K219 Gastro-esophageal reflux disease without esophagitis: Secondary | ICD-10-CM

## 2010-08-06 MED ORDER — PEG-KCL-NACL-NASULF-NA ASC-C 100 G PO SOLR: 1.0000 | Freq: Once | ORAL | Status: DC

## 2010-08-06 NOTE — Patient Instructions (Signed)
Colonoscopy [facility] 08/07/10 8:30 am arrive at 7:30 am on 4th floor Moviprep sent to your pharmacy Colonoscopy brochure given for you to review.

## 2010-08-06 NOTE — Progress Notes (Signed)
HISTORY OF PRESENT ILLNESS:  Walter [name] is a 77 y.o. male with hypertension, hypothyroidism, and anxiety. He has been followed in the office for GERD complicated by Barrett's esophagus, adenomatous colon polyps, and functional abdominal complaints. He was seen recently for his surveillance upper endoscopy. He continues on omeprazole 40 mg daily with good control of GERD symptoms. His chief complaint today is that of alternating bowel habits of several months duration. He describes postprandial lower abdominal cramping with certain meals. Subsequently develops the need to pass flatus. He describes alternating bowel habits. He reports no bowel movement for 3 days which then followed by a good bowel movement and subsequently loose stools occurring once or several times on a particular day, but not for more than 1 day. He reports decreased appetite because of postprandial lower abdominal cramping. However, he will have no problems with certain food items such as [employer] cheeseburger with french fries and beverage. His weight fluctuates, but has been stable. No bleeding or mucus. His last colonoscopy 5 years ago. He is due for surveillance colonoscopy this year.  REVIEW OF SYSTEMS:  All non-GI ROS negative except for sinus and allergy trouble, fatigue, shortness of breath, dizziness, excessive urination, or pain with urination.  Past Medical History  Diagnosis Date  . Anxiety   . Arthritis   . GERD (gastroesophageal reflux disease)   . Hypertension   . Thyroid disease     hypothyroidism  . Barrett's esophagus   . Diverticulosis   . Colon polyps     tubular adenoma-last colon 12/18/2005    Past Surgical History  Procedure Date  . Prostate surgery   . Tonsillectomy   . Colon surgery   . Pyloroplasty     Social History Walter [name]  reports that he quit smoking about 25 years ago. He has never used smokeless tobacco. He reports that he does not drink alcohol or use illicit  drugs.  family history includes Alzheimer's disease in his sister; Heart disease in his mother; Lung cancer in his brother; and Melanoma in his father.  There is no history of Colon cancer.  Allergies  Allergen Reactions  . Penicillins Other (See Comments)    Boils       PHYSICAL EXAMINATION: Vital signs: BP 134/60  Pulse 58  Ht 5\' 10"  (1.778 m)  Wt 200 lb (90.719 kg)  BMI 28.70 kg/m2  Constitutional: generally well-appearing, no acute distress Psychiatric: alert and oriented x3, cooperative. Anxious Eyes: extraocular movements intact, anicteric, conjunctiva pink Mouth: oral pharynx moist, no lesions Neck: supple no lymphadenopathy Cardiovascular: heart regular rate and rhythm, no murmur Lungs: clear to auscultation bilaterally Abdomen: soft, obese, mildly tender with palpation in the lower quadrants, nondistended, no obvious ascites, no peritoneal signs, normal bowel sounds, no organomegaly Rectal: Deferred until colonoscopy Extremities: no lower extremity edema bilaterally Skin: no lesions on visible extremities Neuro: No focal deficits.   ASSESSMENT:  #1. Postprandial lower abdominal cramping and alternating bowel habits. Similar problem years ago. Symptoms most consistent with functional etiologies such as irritable bowel. #2. GERD complicated by Barrett's esophagus. Surveillance up to date #3. History of adenomatous colon polyps. Due for followup this year   PLAN:  #1. Continue reflux precautions and PPI therapy #2. Surveillance endoscopy in 1 year as planned because of atypia on previous biopsies #3. Schedule colonoscopy for surveillance and to evaluate alternating bowel habits. Would do biopsies to rule out microscopic colitis.The nature of the procedure, as well as the risks, benefits, and alternatives  were carefully and thoroughly reviewed with the patient. Ample time for discussion and questions allowed. The patient understood, was satisfied, and agreed to proceed.  Movi prep prescribed. The patient instructed on its use #4. His colonoscopy and biopsies negative, consider treatment with Librax prior to meals #5. Patient has been instructed to have his non-GI complaints addressed by his primary care provider. He agrees

## 2010-08-07 ENCOUNTER — Ambulatory Visit (AMBULATORY_SURGERY_CENTER): Payer: Medicare Other | Admitting: Internal Medicine

## 2010-08-07 ENCOUNTER — Encounter: Payer: Self-pay | Admitting: Internal Medicine

## 2010-08-07 VITALS — BP 132/54 | HR 43 | Temp 97.7°F | Resp 19 | Ht 70.0 in | Wt 200.0 lb

## 2010-08-07 DIAGNOSIS — R197 Diarrhea, unspecified: Secondary | ICD-10-CM

## 2010-08-07 DIAGNOSIS — K5730 Diverticulosis of large intestine without perforation or abscess without bleeding: Secondary | ICD-10-CM

## 2010-08-07 DIAGNOSIS — D126 Benign neoplasm of colon, unspecified: Secondary | ICD-10-CM

## 2010-08-07 DIAGNOSIS — Z86010 Personal history of colonic polyps: Secondary | ICD-10-CM

## 2010-08-07 DIAGNOSIS — Z1211 Encounter for screening for malignant neoplasm of colon: Secondary | ICD-10-CM

## 2010-08-07 DIAGNOSIS — R1084 Generalized abdominal pain: Secondary | ICD-10-CM

## 2010-08-07 MED ORDER — SODIUM CHLORIDE 0.9 % IV SOLN: 500.0000 mL | INTRAVENOUS | Status: DC

## 2010-08-07 MED ORDER — CILIDINIUM-CHLORDIAZEPOXIDE 2.5-5 MG PO CAPS: 1.0000 | ORAL_CAPSULE | Freq: Three times a day (TID) | ORAL | Status: DC | PRN

## 2010-08-07 NOTE — Patient Instructions (Signed)
Refer to the picture page for findings from the colonoscopy exam.  Please follow the green and blue discharge instruction sheets for the rest of the day.  Resume your prior medications today.  Call if any questions or concerns.  Per Dr. Marina Goodell prescribing Librax (generic) to begin and the prescriptions was sent to your pharmacy.  Also please call the office with in the next 1-3 days to schedule follow up visit for Dr. Marina Goodell in 4-6 weeks.

## 2010-08-07 NOTE — Progress Notes (Signed)
No complaint noted on discharge. MAW

## 2010-08-08 ENCOUNTER — Telehealth: Payer: Self-pay | Admitting: *Deleted

## 2010-08-08 NOTE — Telephone Encounter (Signed)

## 2010-10-02 ENCOUNTER — Encounter: Payer: Self-pay | Admitting: Internal Medicine

## 2010-10-02 ENCOUNTER — Ambulatory Visit: Payer: Medicare Other | Admitting: Internal Medicine

## 2010-10-02 ENCOUNTER — Ambulatory Visit (INDEPENDENT_AMBULATORY_CARE_PROVIDER_SITE_OTHER): Payer: Medicare Other | Admitting: Internal Medicine

## 2010-10-02 VITALS — BP 134/76 | HR 60 | Ht 69.0 in | Wt 210.0 lb

## 2010-10-02 DIAGNOSIS — K219 Gastro-esophageal reflux disease without esophagitis: Secondary | ICD-10-CM

## 2010-10-02 DIAGNOSIS — K589 Irritable bowel syndrome without diarrhea: Secondary | ICD-10-CM

## 2010-10-02 DIAGNOSIS — K2270 Barrett's esophagus without dysplasia: Secondary | ICD-10-CM

## 2010-10-02 DIAGNOSIS — R1084 Generalized abdominal pain: Secondary | ICD-10-CM

## 2010-10-02 NOTE — Progress Notes (Signed)
HISTORY OF PRESENT ILLNESS:  Walter [name] is a 77 y.o. male with hypertension, hypothyroidism, anxiety, GERD complicated by Barrett's esophagus with low-grade dysplasia, adenomatous colon polyps, and chronic functional abdominal complaints. He underwent routine surveillance upper endoscopy for Barrett's esophagus 07/12/2010. He continues on daily PPI. He was subsequently seen in the office 08/06/2010 regarding postprandial lower abdominal discomfort and alternating bowel habits. See that dictation. He subsequently underwent complete colonoscopy on 08/07/2010. The ileum was normal. Severe pandiverticulosis noted. Random colon biopsies normal. He was felt to have irritable bowel and prescribed Librax. He states that his abdomen is significantly better. He takes Librax 1 daily due to cost concerns. He is able to eat without abdominal discomfort and has gained 10 pounds. No new issues.  REVIEW OF SYSTEMS:  All non-GI ROS negative.  Past Medical History  Diagnosis Date  . Anxiety   . Arthritis   . GERD (gastroesophageal reflux disease)   . Hypertension   . Thyroid disease     hypothyroidism  . Barrett's esophagus   . Diverticulosis   . Colon polyps     tubular adenoma-last colon 12/18/2005  . Hiatal hernia   . Depression   . Hematoma     Past Surgical History  Procedure Date  . Prostate surgery   . Tonsillectomy   . Pyloroplasty   . Colon surgery     Social History Walter [name]  reports that he quit smoking about 25 years ago. He has never used smokeless tobacco. He reports that he does not drink alcohol or use illicit drugs.  family history includes Alzheimer's disease in his sister; Heart disease in his mother; Lung cancer in his brother; and Melanoma in his father.  There is no history of Colon cancer.  Allergies  Allergen Reactions  . Penicillins Other (See Comments)    Boils       PHYSICAL EXAMINATION:  Vital signs: BP 134/76  Pulse 60  Ht 5\' 9"  (1.753 m)  Wt 210  lb (95.255 kg)  BMI 31.01 kg/m2 General: Well-developed, well-nourished, no acute distress HEENT: Sclerae are anicteric, conjunctiva pink. Oral mucosa intact Lungs: Clear Heart: Regular Abdomen: soft, nontender, nondistended, no obvious ascites, no peritoneal signs, normal bowel sounds. No organomegaly. Extremities: No edema Psychiatric: alert and oriented x3. Cooperative     ASSESSMENT:  #1. Irritable bowel syndrome. He improved on Librax #2. Pandiverticulosis #3. GERD complicated by Barrett's esophagus #4. History of adenomatous colon polyps   PLAN:  #1. Continue Librax as needed. Names of other generic antispasmodics given, and he will investigate with regards to cost #2. Continue PPI #3. Colonoscopy in 5 years #4. Upper endoscopy in 1 year

## 2010-10-02 NOTE — Patient Instructions (Signed)
Follow up as needed with Dr Perry 

## 2011-03-14 DIAGNOSIS — E039 Hypothyroidism, unspecified: Secondary | ICD-10-CM | POA: Diagnosis not present

## 2011-03-14 DIAGNOSIS — F411 Generalized anxiety disorder: Secondary | ICD-10-CM | POA: Diagnosis not present

## 2011-03-14 DIAGNOSIS — M159 Polyosteoarthritis, unspecified: Secondary | ICD-10-CM | POA: Diagnosis not present

## 2011-03-14 DIAGNOSIS — K219 Gastro-esophageal reflux disease without esophagitis: Secondary | ICD-10-CM | POA: Diagnosis not present

## 2011-03-14 DIAGNOSIS — Z23 Encounter for immunization: Secondary | ICD-10-CM | POA: Diagnosis not present

## 2011-03-14 DIAGNOSIS — I10 Essential (primary) hypertension: Secondary | ICD-10-CM | POA: Diagnosis not present

## 2011-04-20 DIAGNOSIS — M7520 Bicipital tendinitis, unspecified shoulder: Secondary | ICD-10-CM | POA: Diagnosis not present

## 2011-04-20 DIAGNOSIS — IMO0002 Reserved for concepts with insufficient information to code with codable children: Secondary | ICD-10-CM | POA: Diagnosis not present

## 2011-04-20 DIAGNOSIS — M62838 Other muscle spasm: Secondary | ICD-10-CM | POA: Diagnosis not present

## 2011-05-01 DIAGNOSIS — M25519 Pain in unspecified shoulder: Secondary | ICD-10-CM | POA: Diagnosis not present

## 2011-05-01 DIAGNOSIS — M542 Cervicalgia: Secondary | ICD-10-CM | POA: Diagnosis not present

## 2011-05-09 DIAGNOSIS — M19019 Primary osteoarthritis, unspecified shoulder: Secondary | ICD-10-CM | POA: Diagnosis not present

## 2011-05-15 DIAGNOSIS — M75120 Complete rotator cuff tear or rupture of unspecified shoulder, not specified as traumatic: Secondary | ICD-10-CM | POA: Diagnosis not present

## 2011-05-27 ENCOUNTER — Telehealth: Payer: Self-pay

## 2011-05-27 MED ORDER — OMEPRAZOLE 40 MG PO CPDR: 40.0000 mg | DELAYED_RELEASE_CAPSULE | Freq: Every day | ORAL | Status: DC

## 2011-05-27 NOTE — Telephone Encounter (Signed)
request for refill of omeprazole; rx refilled

## 2011-09-26 DIAGNOSIS — M159 Polyosteoarthritis, unspecified: Secondary | ICD-10-CM | POA: Diagnosis not present

## 2011-09-26 DIAGNOSIS — E039 Hypothyroidism, unspecified: Secondary | ICD-10-CM | POA: Diagnosis not present

## 2011-09-26 DIAGNOSIS — K219 Gastro-esophageal reflux disease without esophagitis: Secondary | ICD-10-CM | POA: Diagnosis not present

## 2011-09-26 DIAGNOSIS — F411 Generalized anxiety disorder: Secondary | ICD-10-CM | POA: Diagnosis not present

## 2011-09-26 DIAGNOSIS — I10 Essential (primary) hypertension: Secondary | ICD-10-CM | POA: Diagnosis not present

## 2011-10-30 ENCOUNTER — Encounter: Payer: Self-pay | Admitting: Internal Medicine

## 2011-11-20 ENCOUNTER — Encounter: Payer: Self-pay | Admitting: Internal Medicine

## 2011-12-09 DIAGNOSIS — E039 Hypothyroidism, unspecified: Secondary | ICD-10-CM | POA: Diagnosis not present

## 2011-12-17 ENCOUNTER — Telehealth: Payer: Self-pay | Admitting: *Deleted

## 2011-12-17 NOTE — Telephone Encounter (Signed)
Pt. No show for previsit 12/17/11.  Wife in hospital.  [facility] previsit to 12/19/11

## 2011-12-19 ENCOUNTER — Ambulatory Visit (AMBULATORY_SURGERY_CENTER): Payer: Medicare Other

## 2011-12-19 VITALS — Ht 69.5 in | Wt 205.4 lb

## 2011-12-19 DIAGNOSIS — K219 Gastro-esophageal reflux disease without esophagitis: Secondary | ICD-10-CM

## 2011-12-19 DIAGNOSIS — K2270 Barrett's esophagus without dysplasia: Secondary | ICD-10-CM

## 2011-12-31 ENCOUNTER — Ambulatory Visit (AMBULATORY_SURGERY_CENTER): Payer: Medicare Other | Admitting: Internal Medicine

## 2011-12-31 ENCOUNTER — Encounter: Payer: Self-pay | Admitting: Internal Medicine

## 2011-12-31 VITALS — BP 190/113 | HR 49 | Temp 97.9°F | Resp 18 | Ht 69.0 in | Wt 205.0 lb

## 2011-12-31 DIAGNOSIS — K219 Gastro-esophageal reflux disease without esophagitis: Secondary | ICD-10-CM

## 2011-12-31 DIAGNOSIS — I10 Essential (primary) hypertension: Secondary | ICD-10-CM | POA: Diagnosis not present

## 2011-12-31 DIAGNOSIS — E079 Disorder of thyroid, unspecified: Secondary | ICD-10-CM | POA: Diagnosis not present

## 2011-12-31 DIAGNOSIS — F329 Major depressive disorder, single episode, unspecified: Secondary | ICD-10-CM | POA: Diagnosis not present

## 2011-12-31 DIAGNOSIS — K2270 Barrett's esophagus without dysplasia: Secondary | ICD-10-CM | POA: Diagnosis not present

## 2011-12-31 MED ORDER — SODIUM CHLORIDE 0.9 % IV SOLN: 500.0000 mL | INTRAVENOUS | Status: DC

## 2011-12-31 NOTE — Op Note (Signed)
[facility] [address] [place], [address]   ENDOSCOPY PROCEDURE REPORT  PATIENT: Walter [name], Walter [name]  MR#: [id number] BIRTHDATE: 06-01-1933 , 77  yrs. old GENDER: Male ENDOSCOPIST: Roxy Cedar, MD REFERRED BY:  Surveillance Program Recall PROCEDURE DATE:  12/31/2011 PROCEDURE:  EGD w/ biopsies ASA CLASS:     Class II INDICATIONS:  Barrett's low grade dysplasia (10-2007; 05-2009) and atypia 07-2010. MEDICATIONS: MAC sedation, administered by CRNA and propofol (Diprivan) 400mg  IV TOPICAL ANESTHETIC: none  DESCRIPTION OF PROCEDURE: After the risks benefits and alternatives of the procedure were thoroughly explained, informed consent was obtained.  The FUSE Demo Scope endoscope was introduced through the mouth and advanced to the second portion of the duodenum. Without limitations.  The instrument was slowly withdrawn as the mucosa was fully examined.      The esophagus revealed Barrett's type mucosa in the distal 6-7 cm. No evidence for inflammation , nodularity , or mass.  The stomach and duodenum were unremarkable.  Esophageal biopsies were taken in 4 quadrants every 1 cm.   Images taken but only available in hard copy form that will be scanned into EPIC [employer]).  Retroflexed views revealed no abnormalities.     The scope was then withdrawn from the patient and the procedure completed.  COMPLICATIONS: There were no complications. ENDOSCOPIC IMPRESSION: 1. Barrettt's Esophagus 2. GERD.  RECOMMENDATIONS: 1.  Await biopsy results 2.  Continue PPI daily 3.  Upper Endoscopy in one year if no worse than LGD  REPEAT EXAM:  eSigned:  Roxy Cedar, MD 12/31/2011 12:04 PM   XB:JYNW Clovis Riley, MD and The Patient

## 2011-12-31 NOTE — Progress Notes (Signed)
Called to room to assist during endoscopic procedure.  Patient ID and intended procedure confirmed with present staff. Received instructions for my participation in the procedure from the performing physician. ewm 

## 2011-12-31 NOTE — Progress Notes (Signed)
Patient did not have preoperative order for IV antibiotic SSI prophylaxis. (G8918)  Patient did not experience any of the following events: a burn prior to discharge; a fall within the facility; wrong site/side/patient/procedure/implant event; or a hospital transfer or hospital admission upon discharge from the facility. (G8907)  

## 2011-12-31 NOTE — Patient Instructions (Addendum)
YOU HAD AN ENDOSCOPIC PROCEDURE TODAY AT THE [facility]: Refer to the procedure report that was given to you for any specific questions about what was found during the examination.  If the procedure report does not answer your questions, please call your gastroenterologist to clarify.  If you requested that your care partner not be given the details of your procedure findings, then the procedure report has been included in a sealed envelope for you to review at your convenience later.  YOU SHOULD EXPECT: Some feelings of bloating in the abdomen. Passage of more gas than usual.  Walking can help get rid of the air that was put into your GI tract during the procedure and reduce the bloating. DIET: Your first meal following the procedure should be a light meal and then it is ok to progress to your normal diet.  A half-sandwich or bowl of soup is an example of a good first meal.  Heavy or fried foods are harder to digest and may make you feel nauseous or bloated.  Likewise meals heavy in dairy and vegetables can cause extra gas to form and this can also increase the bloating.  Drink plenty of fluids but you should avoid alcoholic beverages for 24 hours.  ACTIVITY: Your care partner should take you home directly after the procedure.  You should plan to take it easy, moving slowly for the rest of the day.  You can resume normal activity the day after the procedure however you should NOT DRIVE or use heavy machinery for 24 hours (because of the sedation medicines used during the test).    SYMPTOMS TO REPORT IMMEDIATELY: A gastroenterologist can be reached at any hour.  During normal business hours, 8:30 AM to 5:00 PM Monday through Friday, call [phone].  After hours and on weekends, please call the GI answering service at [phone] who will take a message and have the physician on call contact you.  Following upper endoscopy (EGD)  Vomiting of blood or coffee ground material  New chest  pain or pain under the shoulder blades  Painful or persistently difficult swallowing  New shortness of breath  Fever of 100F or higher  Black, tarry-looking stools  FOLLOW UP: If any biopsies were taken you will be contacted by phone or by letter within the next 1-3 weeks.  Call your gastroenterologist if you have not heard about the biopsies in 3 weeks.  Our staff will call the home number listed on your records the next business day following your procedure to check on you and address any questions or concerns that you may have at that time regarding the information given to you following your procedure. This is a courtesy call and so if there is no answer at the home number and we have not heard from you through the emergency physician on call, we will assume that you have returned to your regular daily activities without incident.  SIGNATURES/CONFIDENTIALITY: You and/or your care partner have signed paperwork which will be entered into your electronic medical record.  These signatures attest to the fact that that the information above on your After Visit Summary has been reviewed and is understood.  Full responsibility of the confidentiality of this discharge information lies with you and/or your care-partner.   Please read information about GERD and Barretts  Ok to resume your normal medications  Await pathology for next Endoscopy

## 2012-01-03 ENCOUNTER — Telehealth: Payer: Self-pay | Admitting: *Deleted

## 2012-01-03 NOTE — Telephone Encounter (Signed)
No answer, pt. Does not have answering machine.  Was advised  by Andree Elk on day of procedure that he would be called today.  He was advised to call office if he had any Problems or question or to call Dr. On call if problems occurred before today.

## 2012-01-09 ENCOUNTER — Encounter: Payer: Self-pay | Admitting: Internal Medicine

## 2012-02-10 DIAGNOSIS — M722 Plantar fascial fibromatosis: Secondary | ICD-10-CM | POA: Diagnosis not present

## 2012-02-25 DIAGNOSIS — J069 Acute upper respiratory infection, unspecified: Secondary | ICD-10-CM | POA: Diagnosis not present

## 2012-03-04 DIAGNOSIS — J209 Acute bronchitis, unspecified: Secondary | ICD-10-CM | POA: Diagnosis not present

## 2012-03-12 ENCOUNTER — Telehealth: Payer: Self-pay

## 2012-03-12 MED ORDER — OMEPRAZOLE 40 MG PO CPDR: 40.0000 mg | DELAYED_RELEASE_CAPSULE | Freq: Every day | ORAL | Status: DC

## 2012-03-12 NOTE — Telephone Encounter (Signed)
Refilled Prilosec

## 2012-03-18 ENCOUNTER — Telehealth: Payer: Self-pay

## 2012-03-18 MED ORDER — OMEPRAZOLE 40 MG PO CPDR: 40.0000 mg | DELAYED_RELEASE_CAPSULE | Freq: Every day | ORAL | Status: DC

## 2012-03-18 NOTE — Telephone Encounter (Signed)
Resent rx for Prilosec- it was supposed to go to [facility].

## 2012-03-20 DIAGNOSIS — J069 Acute upper respiratory infection, unspecified: Secondary | ICD-10-CM | POA: Diagnosis not present

## 2012-03-20 DIAGNOSIS — E039 Hypothyroidism, unspecified: Secondary | ICD-10-CM | POA: Diagnosis not present

## 2012-03-20 DIAGNOSIS — M159 Polyosteoarthritis, unspecified: Secondary | ICD-10-CM | POA: Diagnosis not present

## 2012-03-20 DIAGNOSIS — F411 Generalized anxiety disorder: Secondary | ICD-10-CM | POA: Diagnosis not present

## 2012-03-20 DIAGNOSIS — I10 Essential (primary) hypertension: Secondary | ICD-10-CM | POA: Diagnosis not present

## 2012-03-20 DIAGNOSIS — K219 Gastro-esophageal reflux disease without esophagitis: Secondary | ICD-10-CM | POA: Diagnosis not present

## 2012-08-31 DIAGNOSIS — M79609 Pain in unspecified limb: Secondary | ICD-10-CM | POA: Diagnosis not present

## 2012-09-17 DIAGNOSIS — I10 Essential (primary) hypertension: Secondary | ICD-10-CM | POA: Diagnosis not present

## 2012-09-17 DIAGNOSIS — I70219 Atherosclerosis of native arteries of extremities with intermittent claudication, unspecified extremity: Secondary | ICD-10-CM | POA: Diagnosis not present

## 2012-09-17 DIAGNOSIS — R0989 Other specified symptoms and signs involving the circulatory and respiratory systems: Secondary | ICD-10-CM | POA: Diagnosis not present

## 2012-09-18 DIAGNOSIS — I70219 Atherosclerosis of native arteries of extremities with intermittent claudication, unspecified extremity: Secondary | ICD-10-CM | POA: Diagnosis not present

## 2012-09-22 DIAGNOSIS — E7800 Pure hypercholesterolemia, unspecified: Secondary | ICD-10-CM | POA: Diagnosis not present

## 2012-09-22 DIAGNOSIS — M159 Polyosteoarthritis, unspecified: Secondary | ICD-10-CM | POA: Diagnosis not present

## 2012-09-22 DIAGNOSIS — I70219 Atherosclerosis of native arteries of extremities with intermittent claudication, unspecified extremity: Secondary | ICD-10-CM | POA: Diagnosis not present

## 2012-09-22 DIAGNOSIS — K219 Gastro-esophageal reflux disease without esophagitis: Secondary | ICD-10-CM | POA: Diagnosis not present

## 2012-09-22 DIAGNOSIS — E039 Hypothyroidism, unspecified: Secondary | ICD-10-CM | POA: Diagnosis not present

## 2012-09-22 DIAGNOSIS — I10 Essential (primary) hypertension: Secondary | ICD-10-CM | POA: Diagnosis not present

## 2012-09-22 DIAGNOSIS — F411 Generalized anxiety disorder: Secondary | ICD-10-CM | POA: Diagnosis not present

## 2012-09-28 ENCOUNTER — Encounter (HOSPITAL_COMMUNITY): Payer: Self-pay | Admitting: Pharmacy Technician

## 2012-09-28 DIAGNOSIS — I70219 Atherosclerosis of native arteries of extremities with intermittent claudication, unspecified extremity: Secondary | ICD-10-CM | POA: Diagnosis not present

## 2012-09-29 ENCOUNTER — Other Ambulatory Visit: Payer: Self-pay | Admitting: Interventional Cardiology

## 2012-10-01 ENCOUNTER — Other Ambulatory Visit: Payer: Self-pay | Admitting: Interventional Cardiology

## 2012-10-01 ENCOUNTER — Encounter (HOSPITAL_COMMUNITY)
Admission: RE | Disposition: A | Discharge status: Home / Self Care | Payer: Self-pay | Source: Ambulatory Visit | Attending: Interventional Cardiology

## 2012-10-01 ENCOUNTER — Ambulatory Visit (HOSPITAL_COMMUNITY)
Admission: RE | Admit: 2012-10-01 | Discharge: 2012-10-01 | Disposition: A | Discharge status: Home / Self Care | Payer: Medicare Other | Source: Ambulatory Visit | Attending: Interventional Cardiology | Admitting: Interventional Cardiology

## 2012-10-01 DIAGNOSIS — I70219 Atherosclerosis of native arteries of extremities with intermittent claudication, unspecified extremity: Secondary | ICD-10-CM | POA: Diagnosis not present

## 2012-10-01 HISTORY — PX: LOWER EXTREMITY ANGIOGRAM: SHX5508

## 2012-10-01 SURGERY — ANGIOGRAM, LOWER EXTREMITY
Anesthesia: LOCAL

## 2012-10-01 MED ORDER — LIDOCAINE HCL (PF) 1 % IJ SOLN
INTRAMUSCULAR | Status: AC
  Filled 2012-10-01: qty 30

## 2012-10-01 MED ORDER — DIAZEPAM 5 MG PO TABS
5.0000 mg | ORAL_TABLET | ORAL | Status: AC
  Administered 2012-10-01: 5 mg via ORAL

## 2012-10-01 MED ORDER — FENTANYL CITRATE 0.05 MG/ML IJ SOLN
INTRAMUSCULAR | Status: AC
  Filled 2012-10-01: qty 2

## 2012-10-01 MED ORDER — ONDANSETRON HCL 4 MG/2ML IJ SOLN: 4.0000 mg | Freq: Four times a day (QID) | INTRAMUSCULAR | Status: DC | PRN

## 2012-10-01 MED ORDER — ACETAMINOPHEN 325 MG PO TABS: 650.0000 mg | ORAL_TABLET | ORAL | Status: DC | PRN

## 2012-10-01 MED ORDER — ASPIRIN 81 MG PO CHEW
CHEWABLE_TABLET | ORAL | Status: AC
  Administered 2012-10-01: 324 mg via ORAL
  Filled 2012-10-01: qty 4

## 2012-10-01 MED ORDER — SODIUM CHLORIDE 0.9 % IJ SOLN: 3.0000 mL | INTRAMUSCULAR | Status: DC | PRN

## 2012-10-01 MED ORDER — SODIUM CHLORIDE 0.9 % IV SOLN: 250.0000 mL | INTRAVENOUS | Status: DC | PRN

## 2012-10-01 MED ORDER — ASPIRIN 81 MG PO CHEW: 81.0000 mg | CHEWABLE_TABLET | Freq: Every day | ORAL | Status: DC

## 2012-10-01 MED ORDER — HEPARIN (PORCINE) IN NACL 2-0.9 UNIT/ML-% IJ SOLN
INTRAMUSCULAR | Status: AC
  Filled 2012-10-01: qty 1000

## 2012-10-01 MED ORDER — SODIUM CHLORIDE 0.9 % IJ SOLN: 3.0000 mL | Freq: Two times a day (BID) | INTRAMUSCULAR | Status: DC

## 2012-10-01 MED ORDER — DIAZEPAM 5 MG PO TABS
ORAL_TABLET | ORAL | Status: AC
  Administered 2012-10-01: 5 mg via ORAL
  Filled 2012-10-01: qty 1

## 2012-10-01 MED ORDER — SODIUM CHLORIDE 0.9 % IV SOLN: INTRAVENOUS | Status: AC

## 2012-10-01 MED ORDER — SODIUM CHLORIDE 0.9 % IV SOLN
INTRAVENOUS | Status: DC
  Administered 2012-10-01: 11:00:00 via INTRAVENOUS

## 2012-10-01 MED ORDER — ASPIRIN 81 MG PO CHEW
324.0000 mg | CHEWABLE_TABLET | ORAL | Status: AC
  Administered 2012-10-01: 324 mg via ORAL

## 2012-10-01 MED ORDER — MIDAZOLAM HCL 2 MG/2ML IJ SOLN
INTRAMUSCULAR | Status: AC
Start: 2012-10-01 — End: 2012-10-01
  Filled 2012-10-01: qty 2

## 2012-10-01 NOTE — H&P (Signed)
  Date of Initial H&P: 09/17/12  History reviewed, patient examined, no change in status, stable for surgery.

## 2012-10-01 NOTE — CV Procedure (Addendum)
PROCEDURE:  Abdominal aortogram.  Pelvic angiogram.  Bilateral selective lower extremity runoff.  INDICATIONS:  Claudication  The risks, benefits, and details of the procedure were explained to the patient.  The patient verbalized understanding and wanted to proceed.  Informed written consent was obtained.  PROCEDURE TECHNIQUE:  After Xylocaine anesthesia a 87F sheath was placed in the right femoral artery with a single anterior needle wall stick.  A pigtail catheter was used to image the abdominal aorta and do the pelvic angiogram.  A LIMA catheter was used to access the left iliac system.  A 4 Fr Straight catheter was placed in the left external iliac and a selective left lower extremity angiogram was performed.  The right lower extremity was visualized with a power injection through the sheath.     CONTRAST:  Total of 107 cc.  COMPLICATIONS:  None.      ANGIOGRAPHIC DATA:   No AAA.  Bilateral common, internal and external iliac arteries are widely patent.  Bilateral common femoral arteries are widely patent.    Right Lower extremity: Right profunda femoral artery is widely patent.  Proximal and mid superficial femoral artery are widely patent with mild atherosclerosis.  There is diffuse disease in the distal SFA from 50%- 80%.  Popliteal stenosis up to 90%.  Right anterior tibial artery has mild proximal stenosis.  The remainder of the vessel is widely patent.    Right tibioperoneal trunk is widely patent.  The posterior tibial artery is occluded.  The peroneal artery has a proximal 80% stenosis.  The distal peroneal artery is occluded.  THere are collaterals that fill the distal PTA.    Left Lower Extremity: Mild, diffuse atherosclerosis in the left SFA.  The popliteal artery is occluded just above the joint space.  Severe tibial vessel disease on the left.  The posterior tibial artery has a subtotal occlusion proximally.  There is moderate disease in the anterior tibial artery proximally.  The  distal anterior tibial appears occuded.  The peroneal artery is possibly occluded in the mid portion.      IMPRESSIONS:  1. Left popliteal occlusion. 2.  Significant disease in the right distal SFA. 3.  Significant tibial vessel disease bilaterally.    RECOMMENDATION:  Limited options for percutaneous revascularization.  Will discuss with surgery and discuss with the patient.  Stop beta blocker due to bradycardia.   Marland Kitchen

## 2012-10-19 ENCOUNTER — Encounter: Payer: Self-pay | Admitting: Interventional Cardiology

## 2012-10-19 ENCOUNTER — Ambulatory Visit (INDEPENDENT_AMBULATORY_CARE_PROVIDER_SITE_OTHER): Payer: Medicare Other | Admitting: Interventional Cardiology

## 2012-10-19 VITALS — BP 134/76 | HR 53 | Ht 69.0 in | Wt 199.0 lb

## 2012-10-19 DIAGNOSIS — I70219 Atherosclerosis of native arteries of extremities with intermittent claudication, unspecified extremity: Secondary | ICD-10-CM | POA: Diagnosis not present

## 2012-10-19 DIAGNOSIS — Z79899 Other long term (current) drug therapy: Secondary | ICD-10-CM | POA: Diagnosis not present

## 2012-10-19 LAB — BASIC METABOLIC PANEL
BUN: 25 mg/dL — ABNORMAL HIGH (ref 6–23)
Calcium: 9.2 mg/dL (ref 8.4–10.5)
Creatinine, Ser: 1.4 mg/dL (ref 0.4–1.5)
Sodium: 138 mEq/L (ref 135–145)

## 2012-10-19 MED ORDER — METOPROLOL SUCCINATE ER 25 MG PO TB24: 25.0000 mg | ORAL_TABLET | Freq: Every day | ORAL | Status: DC

## 2012-10-19 MED ORDER — CLOPIDOGREL BISULFATE 75 MG PO TABS: 75.0000 mg | ORAL_TABLET | Freq: Every day | ORAL | Status: DC

## 2012-10-19 NOTE — Patient Instructions (Addendum)
Your physician has recommended you make the following change in your medication:   1. Start Plavix 75 mg 1 tablet by mouth daily.   2. Continue all other medications.  You have been referred to V[facility]ist. You will be seeing Dr. [name].  Your physician recommends that you return for lab work today for BMET.

## 2012-10-19 NOTE — Progress Notes (Signed)
Patient ID: Walter [name], male   DOB: 09-17-1933, 79 y.o.   MRN: [id number]    [address] [place], [place]  [address] Phone: [phone] Fax:  [phone]  Date:  10/19/2012   ID:  Walter [name], DOB 30-Jun-1933, MRN [id number]  PCP:  Lupe Carney, MD      History of Present Illness: Walter [name] is a 79 y.o. male  who noted left calf cramping starting on August 23. It can ocur while he is trying to stand or at night. He does not walk much. Shopping at [place] is longest wal. Left calf pain will start after a few minutes of walking and resolve with rest. It will recur with restarting walking. He feels his feet burn bilaterally.   He had an angiogram showing severe right distal SFA disease and an occluded left popliteal artery.  THere was bilateral tibial vessel disease as well.  He continues to be severely limited by bilateral claudication. Left is worse than the right. He has trouble standing for long periods of time as well. No nonhealing sores. He has noticed that both sets of toes are dark red most of the time.    Wt Readings from Last 3 Encounters:  10/19/12 199 lb (90.266 kg)  10/01/12 185 lb (83.915 kg)  10/01/12 185 lb (83.915 kg)     Past Medical History  Diagnosis Date  . Anxiety   . Arthritis   . GERD (gastroesophageal reflux disease)   . Hypertension   . Thyroid disease     hypothyroidism  . Barrett's esophagus   . Diverticulosis   . Colon polyps     tubular adenoma-last colon 12/18/2005  . Hiatal hernia   . Depression   . Hematoma     on mid back    Current Outpatient Prescriptions  Medication Sig Dispense Refill  . acetaminophen (TYLENOL ARTHRITIS PAIN) 650 MG CR tablet Take 650 mg by mouth every 8 (eight) hours as needed for pain.      Marland Kitchen FLUoxetine (PROZAC) 20 MG capsule Take 20 mg by mouth at bedtime.       Marland Kitchen levothyroxine (SYNTHROID, LEVOTHROID) 125 MCG tablet Take 125 mcg by mouth daily before breakfast.      . Omega-3 Fatty Acids  (OMEGA 3 PO) Take 2 capsules by mouth daily.      Marland Kitchen omeprazole (PRILOSEC) 40 MG capsule Take 40 mg by mouth daily.      Marland Kitchen OVER THE COUNTER MEDICATION Place 2 sprays into both nostrils 2 (two) times daily. Over the counter nasal spray.      . pentoxifylline (TRENTAL) 400 MG CR tablet Take 400 mg by mouth 2 (two) times daily.      . Probiotic Product (PROBIOTIC PEARLS ADVANTAGE PO) Take 1 capsule by mouth daily.       No current facility-administered medications for this visit.    Allergies:    Allergies  Allergen Reactions  . Penicillins Other (See Comments)    Boils    Social History:  The patient  reports that he quit smoking about 27 years ago. His smoking use included Cigarettes. He smoked 0.00 packs per day. He has never used smokeless tobacco. He reports that he does not drink alcohol or use illicit drugs.   Family History:  The patient's family history includes Alzheimer's disease in his sister; Heart disease in his mother; Lung cancer in his brother; Melanoma in his father. There is no history of Colon cancer.  ROS:  Please see the history of present illness.  No nausea, vomiting.  No fevers, chills.  No focal weakness.  No dysuria. Claudication as noted above   All other systems reviewed and negative.   PHYSICAL EXAM: VS:  BP 134/76  Pulse 53  Ht 5\' 9"  (1.753 m)  Wt 199 lb (90.266 kg)  BMI 29.37 kg/m2  SpO2 99% Well nourished, well developed, in no acute distress HEENT: normal Neck: no JVD, no carotid bruits Cardiac:  normal S1, S2; RRR;  Lungs:  clear to auscultation bilaterally, no wheezing, rhonchi or rales Abd: soft, nontender, no hepatomegaly Ext: no edema, unable to palpate pedal pulses bilaterally Skin: warm and dry Neuro:   no focal abnormalities noted      ASSESSMENT AND PLAN:  1. Claudication: Severe distal right SFA disease which is not completely occluded. Total occlusion of the left popliteal artery. Bilateral disease in the tibial vessels. The right  SFA could likely be treated percutaneously.  The left would certainly be more difficult. Stenting would be limited by pending at the knee joint. There is also a significant tibial disease which would make distal embolization from a technically atherectomy, problematic. Then we should consider a bypass of this occlusion. Will refer to vascular surgery. Start Plavix.  Signed, Fredric Mare, MD, [facility] 10/19/2012 2:02 PM

## 2012-10-28 ENCOUNTER — Encounter: Payer: Self-pay | Admitting: Internal Medicine

## 2012-10-28 DIAGNOSIS — D696 Thrombocytopenia, unspecified: Secondary | ICD-10-CM | POA: Diagnosis not present

## 2012-11-06 ENCOUNTER — Encounter: Payer: Self-pay | Admitting: Surgery

## 2012-11-09 ENCOUNTER — Ambulatory Visit (INDEPENDENT_AMBULATORY_CARE_PROVIDER_SITE_OTHER)
Admission: RE | Admit: 2012-11-09 | Discharge: 2012-11-09 | Disposition: A | Discharge status: Home / Self Care | Payer: Medicare Other | Source: Ambulatory Visit | Attending: Surgery | Admitting: Surgery

## 2012-11-09 ENCOUNTER — Encounter: Payer: Self-pay | Admitting: Surgery

## 2012-11-09 ENCOUNTER — Ambulatory Visit (INDEPENDENT_AMBULATORY_CARE_PROVIDER_SITE_OTHER): Payer: Medicare Other | Admitting: Surgery

## 2012-11-09 ENCOUNTER — Ambulatory Visit (HOSPITAL_COMMUNITY)
Admission: RE | Admit: 2012-11-09 | Discharge: 2012-11-09 | Disposition: A | Discharge status: Home / Self Care | Payer: Medicare Other | Source: Ambulatory Visit | Attending: Surgery | Admitting: Surgery

## 2012-11-09 ENCOUNTER — Other Ambulatory Visit: Payer: Self-pay | Admitting: *Deleted

## 2012-11-09 VITALS — BP 177/70 | HR 48 | Ht 69.0 in | Wt 198.3 lb

## 2012-11-09 DIAGNOSIS — I739 Peripheral vascular disease, unspecified: Secondary | ICD-10-CM | POA: Diagnosis not present

## 2012-11-09 DIAGNOSIS — Z01810 Encounter for preprocedural cardiovascular examination: Secondary | ICD-10-CM

## 2012-11-09 NOTE — Progress Notes (Signed)
[facility] [place]   Patient name: Walter [name] MRN: [id number] DOB: 12/23/1933 Sex: male   Referred by: Dr. Varanasi  Reason for referral:  Chief Complaint  Patient presents with  . New Evaluation    PVD occluded L poplietal artery - Dr. Varanasi    HISTORY OF PRESENT ILLNESS: This is a very pleasant 78-year-old gentleman who was referred today for evaluation of peripheral vascular disease.  The patient states that he has been having problems with both legs for several years but they have gotten worse over the past couple of months.  He recently underwent angiography which revealed diffuse disease throughout the right distal superficial femoral and above-knee popliteal artery, and an occluded left popliteal artery.  The patient reports having difficulty sleeping at night secondary to pain which he describes as the feeling of nails sticking into his feet.  He does report early ulceration on the left heel.  His ambulation is significantly impaired.  The left leg bothers her more than the right.  The patient has been told he has hypercholesterolemia.  He has been given a prescription for a statin, however he has elected to treat this initially with diet control.  He is also medically managed for hypertension.  He is on aspirin and Plavix.  He did have a trial of condyle, however this was recently discontinued.  He has a history of smoking but quit approximately 30 years ago.  There were no aneurysmal or atherosclerotic problems and his family.    Past Medical History  Diagnosis Date  . Anxiety   . Arthritis   . GERD (gastroesophageal reflux disease)   . Hypertension   . Thyroid disease     hypothyroidism  . Barrett's esophagus   . Diverticulosis   . Colon polyps     tubular adenoma-last colon 12/18/2005  . Hiatal hernia   . Depression   . Hematoma     on mid back    Past Surgical History  Procedure Laterality Date  . Prostate surgery    . Tonsillectomy     . Pyloroplasty    . Polypectomy    . Colonoscopy    . Upper gastrointestinal endoscopy      History   Social History  . Marital Status: Married    Spouse Name: N/A    Number of Children: N/A  . Years of Education: N/A   Occupational History  . Retired    Social History Main Topics  . Smoking status: Former Smoker    Types: Cigarettes    Quit date: 06/28/1985  . Smokeless tobacco: Never Used  . Alcohol Use: No  . Drug Use: No  . Sexual Activity: Not on file   Other Topics Concern  . Not on file   Social History Narrative  . No narrative on file    Family History  Problem Relation Age of Onset  . Colon cancer Neg Hx   . Heart disease Mother   . Lung cancer Brother     and sister  . Cancer Brother   . Melanoma Father   . Cancer Father   . Alzheimer's disease Sister     x 2  . Heart disease Sister   . Hypertension Sister     Allergies as of 11/09/2012 - Review Complete 11/09/2012  Allergen Reaction Noted  . Penicillins Other (See Comments)     Current Outpatient Prescriptions on File Prior to Visit  Medication Sig Dispense Refill  . acetaminophen (  TYLENOL ARTHRITIS PAIN) 650 MG CR tablet Take 650 mg by mouth every 8 (eight) hours as needed for pain.      . clopidogrel (PLAVIX) 75 MG tablet Take 1 tablet (75 mg total) by mouth daily.  90 tablet  3  . FLUoxetine (PROZAC) 20 MG capsule Take 20 mg by mouth at bedtime.       . levothyroxine (SYNTHROID, LEVOTHROID) 125 MCG tablet Take 125 mcg by mouth daily before breakfast.      . metoprolol succinate (TOPROL-XL) 25 MG 24 hr tablet Take 1 tablet (25 mg total) by mouth daily.  30 tablet  2  . Omega-3 Fatty Acids (OMEGA 3 PO) Take 2 capsules by mouth daily.      . omeprazole (PRILOSEC) 40 MG capsule Take 40 mg by mouth daily.      . OVER THE COUNTER MEDICATION Place 2 sprays into both nostrils 2 (two) times daily. Over the counter nasal spray.      . Probiotic Product (PROBIOTIC PEARLS ADVANTAGE PO) Take 1  capsule by mouth daily.      . pentoxifylline (TRENTAL) 400 MG CR tablet Take 400 mg by mouth 2 (two) times daily.       No current facility-administered medications on file prior to visit.     REVIEW OF SYSTEMS: Cardiovascular: No chest pain, chest pressure, palpitations, orthopnea, or dyspnea on exertion. No claudication or rest pain,  No history of DVT or phlebitis. Pulmonary: No productive cough, asthma or wheezing. Neurologic: No weakness, paresthesias, aphasia, or amaurosis. No dizziness. Hematologic: No bleeding problems or clotting disorders. Musculoskeletal: No joint pain or joint swelling. Gastrointestinal: No blood in stool or hematemesis Genitourinary: No dysuria or hematuria. Psychiatric:: No history of major depression. Integumentary: No rashes or ulcers. Constitutional: No fever or chills.  PHYSICAL EXAMINATION: General: The patient appears their stated age.  Vital signs are BP 177/70  Pulse 48  Ht 5' 9" (1.753 m)  Wt 198 lb 4.8 oz (89.948 kg)  BMI 29.27 kg/m2  SpO2 99% HEENT:  No gross abnormalities Pulmonary: Respirations are non-labored Abdomen: Soft and non-tender.  Aorta is nonpalpable.  Musculoskeletal: There are no major deformities.   Neurologic: No focal weakness or paresthesias are detected, Skin: Bluish discoloration of both feet with a slight skin separation of the left heel. Psychiatric: The patient has normal affect. Cardiovascular: There is a regular rate and rhythm without significant murmur appreciated.  Palpable femoral pulses.  No carotid bruits.  Diagnostic Studies: I have reviewed his angiogram which shows an occluded left popliteal artery and diffuse disease within the distal right superficial femoral and above-knee popliteal artery.    Carotid duplex was ordered and reviewed today.  This shows less than 40% ICA stenosis bilaterally  Grocery duplex was negative for popliteal aneurysm  Vein mapping was performed.  This shows a marginal  left great saphenous vein from the groin to the knee     Assessment:  Peripheral vascular disease with rest pain, left greater than right Plan: I long discussion with the patient.  He has early ulceration of the left leg.  I do feel that proceeding with bypass graft is in his best interest.  After our discussion I think that the left leg should be done first with reevaluation of the right leg down the road.  Based on his angiogram and vein mapping, I feel the best operation would be an attempt to utilize his left great saphenous vein and doing an above-knee to below-knee popliteal bypass.    If I find out that his vein is not usable I would consider using prosthetic material.  The patient is on Plavix he will need to be off Plavix for 5 days.  He wants to discuss this with his wife.  He will contact me to schedule his operation.     V. Wells Ciela Mahajan IV, M.D. [facility] [place] Office: [phone] Pager:  [phone]   

## 2012-11-13 ENCOUNTER — Encounter (HOSPITAL_COMMUNITY): Payer: Self-pay | Admitting: Pharmacy Technician

## 2012-11-17 ENCOUNTER — Other Ambulatory Visit: Payer: Self-pay

## 2012-11-24 ENCOUNTER — Ambulatory Visit (HOSPITAL_COMMUNITY)
Admission: RE | Admit: 2012-11-24 | Discharge: 2012-11-24 | Disposition: A | Discharge status: Home / Self Care | Payer: Medicare Other | Source: Ambulatory Visit | Attending: Anesthesiology | Admitting: Anesthesiology

## 2012-11-24 ENCOUNTER — Encounter (HOSPITAL_COMMUNITY): Payer: Self-pay

## 2012-11-24 ENCOUNTER — Other Ambulatory Visit: Payer: Self-pay | Admitting: *Deleted

## 2012-11-24 ENCOUNTER — Encounter (HOSPITAL_COMMUNITY)
Admission: RE | Admit: 2012-11-24 | Discharge: 2012-11-24 | Disposition: A | Discharge status: Home / Self Care | Payer: Medicare Other | Source: Ambulatory Visit | Attending: Surgery | Admitting: Surgery

## 2012-11-24 DIAGNOSIS — Z87891 Personal history of nicotine dependence: Secondary | ICD-10-CM | POA: Insufficient documentation

## 2012-11-24 DIAGNOSIS — Z01818 Encounter for other preprocedural examination: Secondary | ICD-10-CM | POA: Insufficient documentation

## 2012-11-24 DIAGNOSIS — Z22322 Carrier or suspected carrier of Methicillin resistant Staphylococcus aureus: Secondary | ICD-10-CM

## 2012-11-24 HISTORY — DX: Hypothyroidism, unspecified: E03.9

## 2012-11-24 LAB — URINE MICROSCOPIC-ADD ON

## 2012-11-24 LAB — CBC
HCT: 38.7 % — ABNORMAL LOW (ref 39.0–52.0)
Hemoglobin: 13.6 g/dL (ref 13.0–17.0)
MCH: 32.6 pg (ref 26.0–34.0)
MCV: 92.8 fL (ref 78.0–100.0)
Platelets: 106 10*3/uL — ABNORMAL LOW (ref 150–400)
RDW: 12.9 % (ref 11.5–15.5)
WBC: 10.2 10*3/uL (ref 4.0–10.5)

## 2012-11-24 LAB — COMPREHENSIVE METABOLIC PANEL
AST: 24 U/L (ref 0–37)
Albumin: 3.7 g/dL (ref 3.5–5.2)
BUN: 27 mg/dL — ABNORMAL HIGH (ref 6–23)
CO2: 23 mEq/L (ref 19–32)
Calcium: 9.6 mg/dL (ref 8.4–10.5)
Creatinine, Ser: 1.37 mg/dL — ABNORMAL HIGH (ref 0.50–1.35)
GFR calc non Af Amer: 49 mL/min — ABNORMAL LOW (ref >90)
Sodium: 138 mEq/L (ref 135–145)
Total Bilirubin: 0.4 mg/dL (ref 0.3–1.2)
Total Protein: 7.4 g/dL (ref 6.0–8.3)

## 2012-11-24 LAB — TYPE AND SCREEN
ABO/RH(D): O POS
Antibody Screen: NEGATIVE

## 2012-11-24 LAB — URINALYSIS, ROUTINE W REFLEX MICROSCOPIC
Glucose, UA: NEGATIVE mg/dL
Ketones, ur: NEGATIVE mg/dL
Protein, ur: NEGATIVE mg/dL
pH: 6 (ref 5.0–8.0)

## 2012-11-24 LAB — PROTIME-INR
INR: 0.96 (ref 0.00–1.49)
Prothrombin Time: 12.6 seconds (ref 11.6–15.2)

## 2012-11-24 LAB — ABO/RH: ABO/RH(D): O POS

## 2012-11-24 LAB — APTT: aPTT: 33 seconds (ref 24–37)

## 2012-11-24 MED ORDER — MUPIROCIN 2 % EX OINT: TOPICAL_OINTMENT | CUTANEOUS | Status: DC

## 2012-11-24 NOTE — Progress Notes (Signed)
11/24/12 1121  OBSTRUCTIVE SLEEP APNEA  Have you ever been diagnosed with sleep apnea through a sleep study? No  Do you snore loudly (loud enough to be heard through closed doors)?  0  Do you often feel tired, fatigued, or sleepy during the daytime? 1  Has anyone observed you stop breathing during your sleep? 0  Do you have, or are you being treated for high blood pressure? 1  BMI more than 35 kg/m2? 0  Age over 77 years old? 1  Neck circumference greater than 40 cm/18 inches? 0  Gender: 1  Obstructive Sleep Apnea Score 4  Score 4 or greater  Results sent to PCP

## 2012-11-24 NOTE — Progress Notes (Signed)
Patient ID: Walter [name], male   DOB: 1933/03/31, 79 y.o.   MRN: [id number] Danford Bad from Short Stay requested [facility] call in Mupirocin for [name] because he tested positive for MRSA and Staph Aureous. It was faxed to [facility].

## 2012-11-24 NOTE — Progress Notes (Signed)
Cardiologist with [facility] (Dr. Elsie Lincoln), has retired, now sees Dr. Eldridge Dace, see Epic.  Pt reports having a stress test and ECHO done more than 5 years.  EKG and heart cath in Epic from 09/2012.  PCP is Dr. Lupe Carney

## 2012-11-24 NOTE — Pre-Procedure Instructions (Signed)
HOY FALLERT  11/24/2012   Your procedure is scheduled on:  Fri, Nov 21 @ 10:20 AM  Report to [facility] Stay Entrance A at 7:30 AM.  Call this number if you have problems the morning of surgery: [phone]   Remember:   Do not eat food or drink liquids after midnight.   Take these medicines the morning of surgery with A SIP OF WATER: Xanax(Alprazolam),Synthroid(Levothyroxine),Metoprolol(Toprol),and Omeprazole(Prilosec)               Stop taking your Omega 3 and Plavix. No Goody's,BC's,Aleve,Ibuprofen,Aspirin,or any Herbal Medications   Do not wear jewelry, make-up or nail polish.  Do not wear lotions, powders, or perfumes. You may wear deodorant.  Do not shave 48 hours prior to surgery. Men may shave face and neck.  Do not bring valuables to the hospital.  [facility] is not responsible                  for any belongings or valuables.               Contacts, dentures or bridgework may not be worn into surgery.  Leave suitcase in the car. After surgery it may be brought to your room.  For patients admitted to the hospital, discharge time is determined by your                treatment team.                 Special Instructions: Shower using CHG 2 nights before surgery and the night before surgery.  If you shower the day of surgery use CHG.  Use special wash - you have one bottle of CHG for all showers.  You should use approximately 1/3 of the bottle for each shower.   Please read over the following fact sheets that you were given: Pain Booklet, Coughing and Deep Breathing, Blood Transfusion Information, MRSA Information and Surgical Site Infection Prevention

## 2012-11-24 NOTE — Progress Notes (Signed)
Anesthesia Chart Review:  Patient is a 79 year old male scheduled for left FPBG on 11/27/12 by Dr. [name]. Patient was referred to [facility] by cardiologist Dr. Eldridge Dace after recent arteriogram for claudication evaluation showed severe distal right SFA disease, totally occluded left popliteal artery, bilateral tibial disease.   History includes former smoker, HTN, hiatal hernia, GERD, Barrett's esophagus, anxiety, hypothyroidism, depression, diverticulosis, arthritis, pyloroplasty, TURP '06, tonsillectomy, cataract extraction. He had minimal CAD by cath in 2010. PCP is Dr. Lupe Carney.   EKG on 10/01/12 showed SB. He reported that his last stress and echo were > 5 years ago.  Cardiac cath on 03/15/08 (done for chest pain) showed normal LM, 30% segmental proximal LAD stenosis, no significant disease of the LCX and RCA, EF 55% without focal wall motion abnormalities.  CXR on 11/24/12 showed hyperaeration, no active lung disease.  Preoperative labs noted.  BUN/Cr 27/1.37 which is stable compared to prior labs from 05/04/09 and 10/19/12.  PLT count is 106K (new since 2010, but not significantly changed since 09/28/12 labs done at [facility] that showed PLT count of 109K), but LFTS and PT/PTT are WNL.  T&S done.  Urine culture is still pending.  Patient does have evidence of thrombocytopenia, but PLT count remains > 100K, LFTs and coags WNL, and renal function is stable so I do not think he will require additional preoperative labs.    Velna [facility]/Anesthesiology Phone [phone] 11/24/2012 5:39 PM

## 2012-11-25 LAB — URINE CULTURE

## 2012-11-26 ENCOUNTER — Inpatient Hospital Stay (HOSPITAL_COMMUNITY): Payer: Medicare Other

## 2012-11-26 ENCOUNTER — Other Ambulatory Visit: Payer: Self-pay

## 2012-11-26 DIAGNOSIS — E7800 Pure hypercholesterolemia, unspecified: Secondary | ICD-10-CM | POA: Diagnosis present

## 2012-11-26 DIAGNOSIS — E039 Hypothyroidism, unspecified: Secondary | ICD-10-CM | POA: Diagnosis present

## 2012-11-26 DIAGNOSIS — L97409 Non-pressure chronic ulcer of unspecified heel and midfoot with unspecified severity: Secondary | ICD-10-CM | POA: Diagnosis not present

## 2012-11-26 DIAGNOSIS — D696 Thrombocytopenia, unspecified: Secondary | ICD-10-CM | POA: Diagnosis not present

## 2012-11-26 DIAGNOSIS — K2270 Barrett's esophagus without dysplasia: Secondary | ICD-10-CM | POA: Diagnosis not present

## 2012-11-26 DIAGNOSIS — K219 Gastro-esophageal reflux disease without esophagitis: Secondary | ICD-10-CM | POA: Diagnosis not present

## 2012-11-26 DIAGNOSIS — I739 Peripheral vascular disease, unspecified: Secondary | ICD-10-CM | POA: Diagnosis not present

## 2012-11-26 DIAGNOSIS — M129 Arthropathy, unspecified: Secondary | ICD-10-CM | POA: Diagnosis present

## 2012-11-26 DIAGNOSIS — L98499 Non-pressure chronic ulcer of skin of other sites with unspecified severity: Secondary | ICD-10-CM | POA: Diagnosis not present

## 2012-11-26 DIAGNOSIS — I7092 Chronic total occlusion of artery of the extremities: Secondary | ICD-10-CM | POA: Diagnosis not present

## 2012-11-26 DIAGNOSIS — I10 Essential (primary) hypertension: Secondary | ICD-10-CM | POA: Diagnosis not present

## 2012-11-26 DIAGNOSIS — Z87891 Personal history of nicotine dependence: Secondary | ICD-10-CM | POA: Diagnosis not present

## 2012-11-26 DIAGNOSIS — Z880 Allergy status to penicillin: Secondary | ICD-10-CM | POA: Diagnosis not present

## 2012-11-26 LAB — URINE MICROSCOPIC-ADD ON

## 2012-11-26 LAB — URINALYSIS, ROUTINE W REFLEX MICROSCOPIC
Bilirubin Urine: NEGATIVE
Glucose, UA: NEGATIVE mg/dL
Hgb urine dipstick: NEGATIVE
Specific Gravity, Urine: 1.019 (ref 1.005–1.030)
pH: 6 (ref 5.0–8.0)

## 2012-11-26 MED ORDER — VANCOMYCIN HCL IN DEXTROSE 1-5 GM/200ML-% IV SOLN
1000.0000 mg | INTRAVENOUS | Status: AC
  Administered 2012-11-27: 1000 mg via INTRAVENOUS
  Filled 2012-11-26: qty 200

## 2012-11-26 NOTE — Progress Notes (Signed)
Nurse called and left a voicemail with Okey Regal Pullins at Dr. Estanislado Spire office requesting that Dr. [name] review patients urinalysis and urine culture. Direct call back number left.

## 2012-11-27 ENCOUNTER — Encounter (HOSPITAL_COMMUNITY): Payer: Self-pay | Admitting: *Deleted

## 2012-11-27 ENCOUNTER — Inpatient Hospital Stay (HOSPITAL_COMMUNITY): Payer: Medicare Other | Admitting: Anesthesiology

## 2012-11-27 ENCOUNTER — Encounter (HOSPITAL_COMMUNITY)
Admission: RE | Disposition: A | Discharge status: Home / Self Care | Payer: Self-pay | Source: Ambulatory Visit | Attending: Surgery

## 2012-11-27 ENCOUNTER — Inpatient Hospital Stay (HOSPITAL_COMMUNITY)
Admission: RE | Admit: 2012-11-27 | Discharge: 2012-11-30 | DRG: 253 | Disposition: A | Discharge status: Home / Self Care | Payer: Medicare Other | Source: Ambulatory Visit | Attending: Surgery | Admitting: Surgery

## 2012-11-27 ENCOUNTER — Encounter (HOSPITAL_COMMUNITY): Payer: Medicare Other | Admitting: Vascular Surgery

## 2012-11-27 DIAGNOSIS — I10 Essential (primary) hypertension: Secondary | ICD-10-CM | POA: Diagnosis not present

## 2012-11-27 DIAGNOSIS — K219 Gastro-esophageal reflux disease without esophagitis: Secondary | ICD-10-CM | POA: Diagnosis present

## 2012-11-27 DIAGNOSIS — D696 Thrombocytopenia, unspecified: Secondary | ICD-10-CM | POA: Diagnosis not present

## 2012-11-27 DIAGNOSIS — L98499 Non-pressure chronic ulcer of skin of other sites with unspecified severity: Secondary | ICD-10-CM

## 2012-11-27 DIAGNOSIS — E039 Hypothyroidism, unspecified: Secondary | ICD-10-CM | POA: Diagnosis present

## 2012-11-27 DIAGNOSIS — L97409 Non-pressure chronic ulcer of unspecified heel and midfoot with unspecified severity: Secondary | ICD-10-CM | POA: Diagnosis present

## 2012-11-27 DIAGNOSIS — E7800 Pure hypercholesterolemia, unspecified: Secondary | ICD-10-CM | POA: Diagnosis present

## 2012-11-27 DIAGNOSIS — K2270 Barrett's esophagus without dysplasia: Secondary | ICD-10-CM | POA: Diagnosis not present

## 2012-11-27 DIAGNOSIS — Z880 Allergy status to penicillin: Secondary | ICD-10-CM

## 2012-11-27 DIAGNOSIS — I739 Peripheral vascular disease, unspecified: Secondary | ICD-10-CM

## 2012-11-27 DIAGNOSIS — M129 Arthropathy, unspecified: Secondary | ICD-10-CM | POA: Diagnosis present

## 2012-11-27 DIAGNOSIS — I7092 Chronic total occlusion of artery of the extremities: Secondary | ICD-10-CM | POA: Diagnosis present

## 2012-11-27 DIAGNOSIS — Z87891 Personal history of nicotine dependence: Secondary | ICD-10-CM

## 2012-11-27 HISTORY — PX: FEMORAL-POPLITEAL BYPASS GRAFT: SHX937

## 2012-11-27 LAB — CBC
HCT: 34.5 % — ABNORMAL LOW (ref 39.0–52.0)
Hemoglobin: 11.4 g/dL — ABNORMAL LOW (ref 13.0–17.0)
MCH: 31.6 pg (ref 26.0–34.0)
MCHC: 33 g/dL (ref 30.0–36.0)
MCV: 95.6 fL (ref 78.0–100.0)
RDW: 13.1 % (ref 11.5–15.5)
WBC: 12.8 10*3/uL — ABNORMAL HIGH (ref 4.0–10.5)

## 2012-11-27 LAB — CREATININE, SERUM
GFR calc Af Amer: 74 mL/min — ABNORMAL LOW (ref >90)
GFR calc non Af Amer: 64 mL/min — ABNORMAL LOW (ref >90)

## 2012-11-27 SURGERY — BYPASS GRAFT FEMORAL-POPLITEAL ARTERY
Anesthesia: General | Site: Leg Upper | Laterality: Left | Wound class: Clean

## 2012-11-27 MED ORDER — METOPROLOL TARTRATE 1 MG/ML IV SOLN: 2.0000 mg | INTRAVENOUS | Status: DC | PRN

## 2012-11-27 MED ORDER — LEVOTHYROXINE SODIUM 125 MCG PO TABS
125.0000 ug | ORAL_TABLET | Freq: Every day | ORAL | Status: DC
  Administered 2012-11-28 – 2012-11-30 (×3): 125 ug via ORAL
  Filled 2012-11-27 (×4): qty 1

## 2012-11-27 MED ORDER — PROPOFOL 10 MG/ML IV BOLUS
INTRAVENOUS | Status: DC | PRN
  Administered 2012-11-27: 50 mg via INTRAVENOUS
  Administered 2012-11-27: 100 mg via INTRAVENOUS

## 2012-11-27 MED ORDER — HEMOSTATIC AGENTS (NO CHARGE) OPTIME
TOPICAL | Status: DC | PRN
  Administered 2012-11-27: 1 via TOPICAL

## 2012-11-27 MED ORDER — POTASSIUM CHLORIDE CRYS ER 20 MEQ PO TBCR: 20.0000 meq | EXTENDED_RELEASE_TABLET | Freq: Every day | ORAL | Status: DC | PRN

## 2012-11-27 MED ORDER — MUPIROCIN 2 % EX OINT
TOPICAL_OINTMENT | Freq: Two times a day (BID) | CUTANEOUS | Status: DC
  Administered 2012-11-27 – 2012-11-30 (×6): via NASAL

## 2012-11-27 MED ORDER — VANCOMYCIN HCL IN DEXTROSE 1-5 GM/200ML-% IV SOLN: 1000.0000 mg | Freq: Two times a day (BID) | INTRAVENOUS | Status: DC

## 2012-11-27 MED ORDER — SODIUM CHLORIDE 0.9 % IV SOLN: 500.0000 mL | Freq: Once | INTRAVENOUS | Status: AC | PRN

## 2012-11-27 MED ORDER — LABETALOL HCL 5 MG/ML IV SOLN
10.0000 mg | INTRAVENOUS | Status: DC | PRN
  Filled 2012-11-27: qty 4

## 2012-11-27 MED ORDER — PHENOL 1.4 % MT LIQD: 1.0000 | OROMUCOSAL | Status: DC | PRN

## 2012-11-27 MED ORDER — ENOXAPARIN SODIUM 30 MG/0.3ML ~~LOC~~ SOLN: 30.0000 mg | SUBCUTANEOUS | Status: DC

## 2012-11-27 MED ORDER — ALUM & MAG HYDROXIDE-SIMETH 200-200-20 MG/5ML PO SUSP
15.0000 mL | ORAL | Status: DC | PRN
  Administered 2012-11-28 (×3): 30 mL via ORAL
  Filled 2012-11-27 (×3): qty 30

## 2012-11-27 MED ORDER — ONDANSETRON HCL 4 MG/2ML IJ SOLN: 4.0000 mg | Freq: Once | INTRAMUSCULAR | Status: DC | PRN

## 2012-11-27 MED ORDER — SALINE SPRAY 0.65 % NA SOLN
1.0000 | NASAL | Status: DC | PRN
  Administered 2012-11-29: 1 via NASAL
  Filled 2012-11-27: qty 44

## 2012-11-27 MED ORDER — LACTATED RINGERS IV SOLN
INTRAVENOUS | Status: DC
  Administered 2012-11-27: 08:00:00 via INTRAVENOUS

## 2012-11-27 MED ORDER — SODIUM CHLORIDE 0.9 % IR SOLN
Status: DC | PRN
  Administered 2012-11-27: 13:00:00

## 2012-11-27 MED ORDER — DOCUSATE SODIUM 100 MG PO CAPS
100.0000 mg | ORAL_CAPSULE | Freq: Every day | ORAL | Status: DC
  Administered 2012-11-28 – 2012-11-30 (×3): 100 mg via ORAL
  Filled 2012-11-27 (×4): qty 1

## 2012-11-27 MED ORDER — ONDANSETRON HCL 4 MG/2ML IJ SOLN
4.0000 mg | Freq: Four times a day (QID) | INTRAMUSCULAR | Status: DC | PRN
  Administered 2012-11-27 – 2012-11-28 (×3): 4 mg via INTRAVENOUS
  Filled 2012-11-27 (×3): qty 2

## 2012-11-27 MED ORDER — CLOPIDOGREL BISULFATE 75 MG PO TABS
75.0000 mg | ORAL_TABLET | Freq: Every day | ORAL | Status: DC
  Administered 2012-11-28 – 2012-11-30 (×3): 75 mg via ORAL
  Filled 2012-11-27 (×3): qty 1

## 2012-11-27 MED ORDER — LIDOCAINE HCL (CARDIAC) 20 MG/ML IV SOLN
INTRAVENOUS | Status: DC | PRN
  Administered 2012-11-27: 50 mg via INTRAVENOUS

## 2012-11-27 MED ORDER — LACTATED RINGERS IV SOLN
INTRAVENOUS | Status: DC | PRN
Start: 2012-11-27 — End: 2012-11-27
  Administered 2012-11-27 (×2): via INTRAVENOUS

## 2012-11-27 MED ORDER — ENOXAPARIN SODIUM 40 MG/0.4ML ~~LOC~~ SOLN
40.0000 mg | SUBCUTANEOUS | Status: DC
  Administered 2012-11-28: 40 mg via SUBCUTANEOUS
  Filled 2012-11-27 (×2): qty 0.4

## 2012-11-27 MED ORDER — HYDROMORPHONE HCL PF 1 MG/ML IJ SOLN
INTRAMUSCULAR | Status: AC
  Filled 2012-11-27: qty 1

## 2012-11-27 MED ORDER — ACETAMINOPHEN 650 MG RE SUPP: 325.0000 mg | RECTAL | Status: DC | PRN

## 2012-11-27 MED ORDER — SENNOSIDES-DOCUSATE SODIUM 8.6-50 MG PO TABS
1.0000 | ORAL_TABLET | Freq: Every evening | ORAL | Status: DC | PRN
  Filled 2012-11-27: qty 1

## 2012-11-27 MED ORDER — ONDANSETRON HCL 4 MG/2ML IJ SOLN
INTRAMUSCULAR | Status: DC | PRN
  Administered 2012-11-27: 4 mg via INTRAVENOUS

## 2012-11-27 MED ORDER — SODIUM CHLORIDE 0.9 % IV SOLN: INTRAVENOUS | Status: DC

## 2012-11-27 MED ORDER — BISACODYL 10 MG RE SUPP: 10.0000 mg | Freq: Every day | RECTAL | Status: DC | PRN

## 2012-11-27 MED ORDER — METOPROLOL SUCCINATE ER 25 MG PO TB24
25.0000 mg | ORAL_TABLET | Freq: Every day | ORAL | Status: DC
  Administered 2012-11-28 – 2012-11-30 (×3): 25 mg via ORAL
  Filled 2012-11-27 (×3): qty 1

## 2012-11-27 MED ORDER — HEPARIN SODIUM (PORCINE) 1000 UNIT/ML IJ SOLN
INTRAMUSCULAR | Status: DC | PRN
  Administered 2012-11-27: 1000 [IU] via INTRAVENOUS
  Administered 2012-11-27: 6000 [IU] via INTRAVENOUS

## 2012-11-27 MED ORDER — GUAIFENESIN-DM 100-10 MG/5ML PO SYRP
15.0000 mL | ORAL_SOLUTION | ORAL | Status: DC | PRN
  Filled 2012-11-27: qty 15

## 2012-11-27 MED ORDER — VANCOMYCIN HCL IN DEXTROSE 750-5 MG/150ML-% IV SOLN
750.0000 mg | Freq: Two times a day (BID) | INTRAVENOUS | Status: AC
  Administered 2012-11-27 – 2012-11-28 (×2): 750 mg via INTRAVENOUS
  Filled 2012-11-27 (×2): qty 150

## 2012-11-27 MED ORDER — FENTANYL CITRATE 0.05 MG/ML IJ SOLN
INTRAMUSCULAR | Status: DC | PRN
  Administered 2012-11-27: 100 ug via INTRAVENOUS
  Administered 2012-11-27: 150 ug via INTRAVENOUS
  Administered 2012-11-27: 100 ug via INTRAVENOUS

## 2012-11-27 MED ORDER — 0.9 % SODIUM CHLORIDE (POUR BTL) OPTIME
TOPICAL | Status: DC | PRN
  Administered 2012-11-27: 2000 mL

## 2012-11-27 MED ORDER — PROTAMINE SULFATE 10 MG/ML IV SOLN
INTRAVENOUS | Status: DC | PRN
  Administered 2012-11-27 (×5): 10 mg via INTRAVENOUS

## 2012-11-27 MED ORDER — ROCURONIUM BROMIDE 100 MG/10ML IV SOLN
INTRAVENOUS | Status: DC | PRN
  Administered 2012-11-27: 50 mg via INTRAVENOUS

## 2012-11-27 MED ORDER — SODIUM CHLORIDE 0.9 % IV SOLN
INTRAVENOUS | Status: DC
  Administered 2012-11-27: 17:00:00 via INTRAVENOUS

## 2012-11-27 MED ORDER — MORPHINE SULFATE 2 MG/ML IJ SOLN
2.0000 mg | INTRAMUSCULAR | Status: DC | PRN
  Administered 2012-11-27 (×2): 2 mg via INTRAVENOUS
  Administered 2012-11-28 (×2): 4 mg via INTRAVENOUS
  Filled 2012-11-27 (×2): qty 1
  Filled 2012-11-27 (×2): qty 2

## 2012-11-27 MED ORDER — RISAQUAD PO CAPS
1.0000 | ORAL_CAPSULE | Freq: Every day | ORAL | Status: DC
  Administered 2012-11-28 – 2012-11-30 (×3): 1 via ORAL
  Filled 2012-11-27 (×3): qty 1

## 2012-11-27 MED ORDER — ACETAMINOPHEN 325 MG PO TABS: 325.0000 mg | ORAL_TABLET | ORAL | Status: DC | PRN

## 2012-11-27 MED ORDER — NEOSTIGMINE METHYLSULFATE 1 MG/ML IJ SOLN
INTRAMUSCULAR | Status: DC | PRN
  Administered 2012-11-27: 2 mg via INTRAVENOUS

## 2012-11-27 MED ORDER — OXYCODONE-ACETAMINOPHEN 5-325 MG PO TABS
1.0000 | ORAL_TABLET | ORAL | Status: DC | PRN
  Administered 2012-11-27: 1 via ORAL
  Administered 2012-11-28 – 2012-11-29 (×4): 2 via ORAL
  Filled 2012-11-27: qty 1
  Filled 2012-11-27 (×4): qty 2

## 2012-11-27 MED ORDER — HYDROMORPHONE HCL PF 1 MG/ML IJ SOLN
0.2500 mg | INTRAMUSCULAR | Status: DC | PRN
  Administered 2012-11-27 (×3): 0.5 mg via INTRAVENOUS

## 2012-11-27 MED ORDER — HYDRALAZINE HCL 20 MG/ML IJ SOLN: 10.0000 mg | INTRAMUSCULAR | Status: DC | PRN

## 2012-11-27 MED ORDER — FLUOXETINE HCL 20 MG PO CAPS
20.0000 mg | ORAL_CAPSULE | Freq: Every day | ORAL | Status: DC
  Administered 2012-11-27 – 2012-11-29 (×3): 20 mg via ORAL
  Filled 2012-11-27 (×4): qty 1

## 2012-11-27 MED ORDER — PANTOPRAZOLE SODIUM 40 MG PO TBEC
40.0000 mg | DELAYED_RELEASE_TABLET | Freq: Every day | ORAL | Status: DC
  Administered 2012-11-28 – 2012-11-30 (×3): 40 mg via ORAL
  Filled 2012-11-27 (×3): qty 1

## 2012-11-27 MED ORDER — ALPRAZOLAM 0.25 MG PO TABS
0.5000 mg | ORAL_TABLET | Freq: Every evening | ORAL | Status: DC | PRN
  Administered 2012-11-27: 0.5 mg via ORAL
  Filled 2012-11-27: qty 1

## 2012-11-27 MED ORDER — GLYCOPYRROLATE 0.2 MG/ML IJ SOLN
INTRAMUSCULAR | Status: DC | PRN
  Administered 2012-11-27: .2 mg via INTRAVENOUS

## 2012-11-27 SURGICAL SUPPLY — 72 items
ADH SKN CLS APL DERMABOND .7 (GAUZE/BANDAGES/DRESSINGS) ×3
BANDAGE ELASTIC 4 VELCRO ST LF (GAUZE/BANDAGES/DRESSINGS) IMPLANT
BANDAGE ESMARK 6X9 LF (GAUZE/BANDAGES/DRESSINGS) IMPLANT
BNDG CMPR 9X6 STRL LF SNTH (GAUZE/BANDAGES/DRESSINGS) ×1
BNDG ESMARK 6X9 LF (GAUZE/BANDAGES/DRESSINGS) ×2
CANISTER SUCTION 2500CC (MISCELLANEOUS) ×2 IMPLANT
CANNULA VESSEL 3MM 2 BLNT TIP (CANNULA) ×1 IMPLANT
CATH EMB 3FR 80CM (CATHETERS) ×1 IMPLANT
CLIP TI MEDIUM 24 (CLIP) ×2 IMPLANT
CLIP TI WIDE RED SMALL 24 (CLIP) ×2 IMPLANT
COVER PROBE W GEL 5X96 (DRAPES) ×1 IMPLANT
COVER SURGICAL LIGHT HANDLE (MISCELLANEOUS) ×2 IMPLANT
CUFF TOURNIQUET SINGLE 24IN (TOURNIQUET CUFF) IMPLANT
CUFF TOURNIQUET SINGLE 34IN LL (TOURNIQUET CUFF) ×1 IMPLANT
CUFF TOURNIQUET SINGLE 44IN (TOURNIQUET CUFF) IMPLANT
DERMABOND ADVANCED (GAUZE/BANDAGES/DRESSINGS) ×3
DERMABOND ADVANCED .7 DNX12 (GAUZE/BANDAGES/DRESSINGS) ×1 IMPLANT
DRAIN CHANNEL 15F RND FF W/TCR (WOUND CARE) IMPLANT
DRAPE WARM FLUID 44X44 (DRAPE) ×2 IMPLANT
DRAPE X-RAY CASS 24X20 (DRAPES) IMPLANT
ELECT CAUTERY BLADE 6.4 (BLADE) ×1 IMPLANT
ELECT REM PT RETURN 9FT ADLT (ELECTROSURGICAL) ×2
ELECTRODE REM PT RTRN 9FT ADLT (ELECTROSURGICAL) ×1 IMPLANT
EVACUATOR SILICONE 100CC (DRAIN) IMPLANT
GLOVE BIO SURGEON STRL SZ 6.5 (GLOVE) ×1 IMPLANT
GLOVE BIOGEL PI IND STRL 6.5 (GLOVE) IMPLANT
GLOVE BIOGEL PI IND STRL 7.0 (GLOVE) IMPLANT
GLOVE BIOGEL PI IND STRL 7.5 (GLOVE) ×1 IMPLANT
GLOVE BIOGEL PI INDICATOR 6.5 (GLOVE) ×1
GLOVE BIOGEL PI INDICATOR 7.0 (GLOVE) ×4
GLOVE BIOGEL PI INDICATOR 7.5 (GLOVE) ×1
GLOVE SS BIOGEL STRL SZ 6.5 (GLOVE) IMPLANT
GLOVE SUPERSENSE BIOGEL SZ 6.5 (GLOVE) ×1
GLOVE SURG SS PI 6.5 STRL IVOR (GLOVE) ×1 IMPLANT
GLOVE SURG SS PI 7.0 STRL IVOR (GLOVE) ×4 IMPLANT
GLOVE SURG SS PI 7.5 STRL IVOR (GLOVE) ×2 IMPLANT
GOWN PREVENTION PLUS XXLARGE (GOWN DISPOSABLE) ×2 IMPLANT
GOWN STRL NON-REIN LRG LVL3 (GOWN DISPOSABLE) ×6 IMPLANT
GOWN STRL REIN XL XLG (GOWN DISPOSABLE) ×4 IMPLANT
HEMOSTAT SNOW SURGICEL 2X4 (HEMOSTASIS) ×1 IMPLANT
KIT BASIN OR (CUSTOM PROCEDURE TRAY) ×2 IMPLANT
KIT ROOM TURNOVER OR (KITS) ×2 IMPLANT
MARKER SKIN DUAL TIP RULER LAB (MISCELLANEOUS) ×1 IMPLANT
NS IRRIG 1000ML POUR BTL (IV SOLUTION) ×4 IMPLANT
PACK PERIPHERAL VASCULAR (CUSTOM PROCEDURE TRAY) ×2 IMPLANT
PAD ARMBOARD 7.5X6 YLW CONV (MISCELLANEOUS) ×4 IMPLANT
PADDING CAST COTTON 6X4 STRL (CAST SUPPLIES) IMPLANT
PENCIL BUTTON HOLSTER BLD 10FT (ELECTRODE) ×1 IMPLANT
SET COLLECT BLD 21X3/4 12 (NEEDLE) IMPLANT
SPONGE GAUZE 4X4 12PLY (GAUZE/BANDAGES/DRESSINGS) ×1 IMPLANT
SPONGE LAP 18X18 X RAY DECT (DISPOSABLE) ×1 IMPLANT
STOPCOCK 4 WAY LG BORE MALE ST (IV SETS) IMPLANT
SUT ETHILON 3 0 PS 1 (SUTURE) IMPLANT
SUT PROLENE 5 0 C 1 24 (SUTURE) ×2 IMPLANT
SUT PROLENE 6 0 BV (SUTURE) ×4 IMPLANT
SUT PROLENE 7 0 BV 1 (SUTURE) IMPLANT
SUT SILK 2 0 SH (SUTURE) ×2 IMPLANT
SUT SILK 3 0 (SUTURE) ×2
SUT SILK 3-0 18XBRD TIE 12 (SUTURE) IMPLANT
SUT VIC AB 2-0 CT1 27 (SUTURE) ×4
SUT VIC AB 2-0 CT1 TAPERPNT 27 (SUTURE) ×2 IMPLANT
SUT VIC AB 3-0 SH 27 (SUTURE) ×4
SUT VIC AB 3-0 SH 27X BRD (SUTURE) ×2 IMPLANT
SUT VICRYL 4-0 PS2 18IN ABS (SUTURE) ×4 IMPLANT
SYR TB 1ML LUER SLIP (SYRINGE) ×1 IMPLANT
TAPE UMBILICAL COTTON 1/8X30 (MISCELLANEOUS) ×1 IMPLANT
TOWEL OR 17X24 6PK STRL BLUE (TOWEL DISPOSABLE) ×4 IMPLANT
TOWEL OR 17X26 10 PK STRL BLUE (TOWEL DISPOSABLE) ×4 IMPLANT
TRAY FOLEY CATH 16FRSI W/METER (SET/KITS/TRAYS/PACK) ×2 IMPLANT
TUBING EXTENTION W/L.L. (IV SETS) IMPLANT
UNDERPAD 30X30 INCONTINENT (UNDERPADS AND DIAPERS) ×2 IMPLANT
WATER STERILE IRR 1000ML POUR (IV SOLUTION) ×2 IMPLANT

## 2012-11-27 NOTE — Transfer of Care (Signed)
Immediate Anesthesia Transfer of Care Note  Patient: Walter [name]  Procedure(s) Performed: Procedure(s): BYPASS GRAFT LEFT ABOVE KNEE TO BELOW KNEE POPLITEAL ARTERY USING LEFT NONREVERSED GREATER SAPPHENOUS VEIN (Left)  Patient Location: PACU  Anesthesia Type:General  Level of Consciousness: awake, alert , oriented and patient cooperative  Airway & Oxygen Therapy: Patient Spontanous Breathing and Patient connected to nasal cannula oxygen  Post-op Assessment: Report given to PACU RN, Post -op Vital signs reviewed and stable and Patient moving all extremities  Post vital signs: Reviewed and stable  Complications: No apparent anesthesia complications

## 2012-11-27 NOTE — Anesthesia Preprocedure Evaluation (Signed)
Anesthesia Evaluation  Patient identified by MRN, date of birth, ID band Patient awake    Reviewed: Allergy & Precautions, H&P , NPO status   Airway Mallampati: II TM Distance: >3 FB Neck ROM: Full    Dental  (+) Teeth Intact and Dental Advisory Given   Pulmonary former smoker,  breath sounds clear to auscultation        Cardiovascular hypertension, Rhythm:Regular Rate:Normal     Neuro/Psych    GI/Hepatic   Endo/Other    Renal/GU      Musculoskeletal   Abdominal   Peds  Hematology   Anesthesia Other Findings   Reproductive/Obstetrics                           Anesthesia Physical Anesthesia Plan  ASA: III  Anesthesia Plan: General   Post-op Pain Management:    Induction: Intravenous  Airway Management Planned: Oral ETT  Additional Equipment:   Intra-op Plan:   Post-operative Plan: Extubation in OR  Informed Consent: I have reviewed the patients History and Physical, chart, labs and discussed the procedure including the risks, benefits and alternatives for the proposed anesthesia with the patient or authorized representative who has indicated his/her understanding and acceptance.   Dental advisory given  Plan Discussed with: CRNA and Anesthesiologist  Anesthesia Plan Comments: (PVD L>R Htn GERD   Plan GA with oral ETT  Kipp Brood)        Anesthesia Quick Evaluation

## 2012-11-27 NOTE — Op Note (Signed)
Patient name: Walter [name] MRN: [id number] DOB: 1933/01/30 Sex: male  11/26/2012 - 11/27/2012 Pre-operative Diagnosis: early ischemic ulcer, left leg Post-operative diagnosis:  Same Surgeon:  Jorge Ny Assistants:  Lloyd Huger  Procedure:   Left above knee popliteal knee popliteal artery bypass graft with ipsilateral non-reversed translocated greater saphenous vein Anesthesia:  Gen.  Blood Loss:  See anesthesia record Specimens:  None  Findings:  Nondiseased above-knee popliteal artery.  Doppler signal and anterior tibial artery at the end of the case  Indications:  The patient has developed lifestyle limiting claudication to as well as borderline rest pain in the left foot.  He also has developed early ulcerative skin changes.  Reoperative angiography revealed an occluded popliteal artery behind the knee.  Vein mapping revealed a marginal vein.  He comes in today for revascularization.  Procedure:  The patient was identified in the holding area and taken to [facility] OR ROOM 12  The patient was then placed supine on the table. general anesthesia was administered.  The patient was prepped and draped in the usual sterile fashion.  A time out was called and antibiotics were administered.  Ultrasound was used to map the course of the saphenous vein in the upper leg.  The vein actually appeared better than it did on ultrasound imaging in the office.  I initially began by making a medial above-the-knee incision.  Through this incision I dissected out the great saphenous vein.  This vein appeared adequate.  Through this incision the fascia was then divided.  The popliteal space was entered.  The popliteal artery was then dissected out from the adductor canal for approximately 7 cm.  The artery was very soft and nondiseased.  The artery was encircled with a vessel loop.  Next a below knee medial incision was made.  Cautery was used to divide the subcutaneous tissue down to the fascia which was  then opened with cautery.  The gastrocnemius muscle was reflected posteriorly.  The popliteal artery and vein were identified.  The artery was then dissected free down to the anterior tibial artery origin.  Through 2 additional incisions in the upper leg, the greater saphenous vein was harvested.  Side branches were ligated between silk ties and metal clips.  The tunnel from the above-knee to below-knee incision was then created.  The vein was then ligated proximally and distally.  It was prepared on the back table.  It did distend to approximately 3 mm.  Proximally, it was approximately 5 mm.  I felt it was an adequate vein.  It was marked with an 8 pen for proper orientation.  The patient was fully heparinized.  After the heparin circulated the above-knee popliteal artery was occluded with vascular clamps.  A #11 blade was used to make an arteriotomy which was extended longitudinally with Potts scissors.  The vein was spatulated to fit the size of the arteriotomy.  The vein was placed in a non-reversed fashion.  A running end-to-side anastomosis was created with 5-0 Prolene.  The anastomosis was completed.  A U. then used several valvulotomes to lyse the valves.  Ultimately there was excellent pulsatile flow through the vein conduit.  The vein was then brought through the previously created tunnel.  A tourniquet was then placed on the upper thigh.  The leg was exsanguinated with an Esmarch and the tourniquet was taken to 250 mm of pressure.  I then used a #11 blade to make an arteriotomy in the below  knee popliteal artery.  Potts scissors were used to open the artery in longitudinal fashion.  The vein was pulled to the appropriate length and then spatulated to fit the size of the arteriotomy.  A running anastomosis was created with 6-0 Prolene.  Prior to completion the tourniquet was let down the appropriate flushing maneuvers were performed.  The anastomosis was then completed.  There was excellent pulsatile flow  through the graft which was confirmed by an excellent Doppler signal just distal to the anastomosis.  The patient also had a Doppler dependent signal within the anterior tibial artery at the ankle.  I elected not to shoot an arteriogram.  50 mg of protamine was used to reverse the heparin.  Once hemostasis was achieved, the vein harvest incisions were closed with 2 layers of 3-0 Vicryl.  The above-knee and below-knee incisions were closed by reapproximating the fascia with 2-0 Vicryl, subcutaneous tissue with 3-0 Vicryl the skin with 4-0 Vicryl.  The patient tolerated the procedure well there were no complications   Disposition:  To PACU in stable condition.   Juleen China, M.D. [facility] [place] Office: [phone] Pager:  [phone]

## 2012-11-27 NOTE — Interval H&P Note (Signed)
History and Physical Interval Note:  11/27/2012 9:59 AM  Walter [name]  has presented today for surgery, with the diagnosis of Peripheral Vascular Disease  The various methods of treatment have been discussed with the patient and family. After consideration of risks, benefits and other options for treatment, the patient has consented to  Procedure(s): BYPASS GRAFT FEMORAL-POPLITEAL ARTERY-LEFT (Left) as a surgical intervention .  The patient's history has been reviewed, patient examined, no change in status, stable for surgery.  I have reviewed the patient's chart and labs.  Questions were answered to the patient's satisfaction.     BRABHAM IV, V. WELLS

## 2012-11-27 NOTE — Preoperative (Signed)
Beta Blockers   Reason not to administer Beta Blockers:Not Applicable 

## 2012-11-27 NOTE — Anesthesia Postprocedure Evaluation (Signed)
  Anesthesia Post-op Note  Patient: Walter [name]  Procedure(s) Performed: Procedure(s): BYPASS GRAFT LEFT ABOVE KNEE TO BELOW KNEE POPLITEAL ARTERY USING LEFT NONREVERSED GREATER SAPPHENOUS VEIN (Left)  Patient Location: PACU  Anesthesia Type:General  Level of Consciousness: awake, alert  and oriented  Airway and Oxygen Therapy: Patient Spontanous Breathing and Patient connected to nasal cannula oxygen  Post-op Pain: mild  Post-op Assessment: Post-op Vital signs reviewed, Patient's Cardiovascular Status Stable, Respiratory Function Stable, Patent Airway and Pain level controlled  Post-op Vital Signs: stable  Complications: No apparent anesthesia complications

## 2012-11-27 NOTE — H&P (View-Only) (Signed)
[facility] [place]   Patient name: Walter [name] MRN: [id number] DOB: [birth year] Sex: male   Referred by: Dr. Eldridge Dace  Reason for referral:  Chief Complaint  Patient presents with  . New Evaluation    PVD occluded L poplietal artery - Dr. Eldridge Dace    HISTORY OF PRESENT ILLNESS: This is a very pleasant 79 year old gentleman who was referred today for evaluation of peripheral vascular disease.  The patient states that he has been having problems with both legs for several years but they have gotten worse over the past couple of months.  He recently underwent angiography which revealed diffuse disease throughout the right distal superficial femoral and above-knee popliteal artery, and an occluded left popliteal artery.  The patient reports having difficulty sleeping at night secondary to pain which he describes as the feeling of nails sticking into his feet.  He does report early ulceration on the left heel.  His ambulation is significantly impaired.  The left leg bothers her more than the right.  The patient has been told he has hypercholesterolemia.  He has been given a prescription for a statin, however he has elected to treat this initially with diet control.  He is also medically managed for hypertension.  He is on aspirin and Plavix.  He did have a trial of condyle, however this was recently discontinued.  He has a history of smoking but quit approximately 30 years ago.  There were no aneurysmal or atherosclerotic problems and his family.    Past Medical History  Diagnosis Date  . Anxiety   . Arthritis   . GERD (gastroesophageal reflux disease)   . Hypertension   . Thyroid disease     hypothyroidism  . Barrett's esophagus   . Diverticulosis   . Colon polyps     tubular adenoma-last colon 12/18/2005  . Hiatal hernia   . Depression   . Hematoma     on mid back    Past Surgical History  Procedure Laterality Date  . Prostate surgery    . Tonsillectomy     . Pyloroplasty    . Polypectomy    . Colonoscopy    . Upper gastrointestinal endoscopy      History   Social History  . Marital Status: Married    Spouse Name: N/A    Number of Children: N/A  . Years of Education: N/A   Occupational History  . Retired    Social History Main Topics  . Smoking status: Former Smoker    Types: Cigarettes    Quit date: 06/28/1985  . Smokeless tobacco: Never Used  . Alcohol Use: No  . Drug Use: No  . Sexual Activity: Not on file   Other Topics Concern  . Not on file   Social History Narrative  . No narrative on file    Family History  Problem Relation Age of Onset  . Colon cancer Neg Hx   . Heart disease Mother   . Lung cancer Brother     and sister  . Cancer Brother   . Melanoma Father   . Cancer Father   . Alzheimer's disease Sister     x 2  . Heart disease Sister   . Hypertension Sister     Allergies as of 11/09/2012 - Review Complete 11/09/2012  Allergen Reaction Noted  . Penicillins Other (See Comments)     Current Outpatient Prescriptions on File Prior to Visit  Medication Sig Dispense Refill  . acetaminophen (  TYLENOL ARTHRITIS PAIN) 650 MG CR tablet Take 650 mg by mouth every 8 (eight) hours as needed for pain.      Marland Kitchen clopidogrel (PLAVIX) 75 MG tablet Take 1 tablet (75 mg total) by mouth daily.  90 tablet  3  . FLUoxetine (PROZAC) 20 MG capsule Take 20 mg by mouth at bedtime.       Marland Kitchen levothyroxine (SYNTHROID, LEVOTHROID) 125 MCG tablet Take 125 mcg by mouth daily before breakfast.      . metoprolol succinate (TOPROL-XL) 25 MG 24 hr tablet Take 1 tablet (25 mg total) by mouth daily.  30 tablet  2  . Omega-3 Fatty Acids (OMEGA 3 PO) Take 2 capsules by mouth daily.      Marland Kitchen omeprazole (PRILOSEC) 40 MG capsule Take 40 mg by mouth daily.      Marland Kitchen OVER THE COUNTER MEDICATION Place 2 sprays into both nostrils 2 (two) times daily. Over the counter nasal spray.      . Probiotic Product (PROBIOTIC PEARLS ADVANTAGE PO) Take 1  capsule by mouth daily.      . pentoxifylline (TRENTAL) 400 MG CR tablet Take 400 mg by mouth 2 (two) times daily.       No current facility-administered medications on file prior to visit.     REVIEW OF SYSTEMS: Cardiovascular: No chest pain, chest pressure, palpitations, orthopnea, or dyspnea on exertion. No claudication or rest pain,  No history of DVT or phlebitis. Pulmonary: No productive cough, asthma or wheezing. Neurologic: No weakness, paresthesias, aphasia, or amaurosis. No dizziness. Hematologic: No bleeding problems or clotting disorders. Musculoskeletal: No joint pain or joint swelling. Gastrointestinal: No blood in stool or hematemesis Genitourinary: No dysuria or hematuria. Psychiatric:: No history of major depression. Integumentary: No rashes or ulcers. Constitutional: No fever or chills.  PHYSICAL EXAMINATION: General: The patient appears their stated age.  Vital signs are BP 177/70  Pulse 48  Ht 5\' 9"  (1.753 m)  Wt 198 lb 4.8 oz (89.948 kg)  BMI 29.27 kg/m2  SpO2 99% HEENT:  No gross abnormalities Pulmonary: Respirations are non-labored Abdomen: Soft and non-tender.  Aorta is nonpalpable.  Musculoskeletal: There are no major deformities.   Neurologic: No focal weakness or paresthesias are detected, Skin: Bluish discoloration of both feet with a slight skin separation of the left heel. Psychiatric: The patient has normal affect. Cardiovascular: There is a regular rate and rhythm without significant murmur appreciated.  Palpable femoral pulses.  No carotid bruits.  Diagnostic Studies: I have reviewed his angiogram which shows an occluded left popliteal artery and diffuse disease within the distal right superficial femoral and above-knee popliteal artery.    Carotid duplex was ordered and reviewed today.  This shows less than 40% ICA stenosis bilaterally  Grocery duplex was negative for popliteal aneurysm  Vein mapping was performed.  This shows a marginal  left great saphenous vein from the groin to the knee     Assessment:  Peripheral vascular disease with rest pain, left greater than right Plan: I long discussion with the patient.  He has early ulceration of the left leg.  I do feel that proceeding with bypass graft is in his best interest.  After our discussion I think that the left leg should be done first with reevaluation of the right leg down the road.  Based on his angiogram and vein mapping, I feel the best operation would be an attempt to utilize his left great saphenous vein and doing an above-knee to below-knee popliteal bypass.  If I find out that his vein is not usable I would consider using prosthetic material.  The patient is on Plavix he will need to be off Plavix for 5 days.  He wants to discuss this with his wife.  He will contact me to schedule his operation.     Jorge Ny, M.D. [facility] [place] Office: [phone] Pager:  [phone]

## 2012-11-27 NOTE — Anesthesia Postprocedure Evaluation (Signed)
  Anesthesia Post-op Note  Patient: Yichen M Muscatello  Procedure(s) Performed: Procedure(s): BYPASS GRAFT LEFT ABOVE KNEE TO BELOW KNEE POPLITEAL ARTERY USING LEFT NONREVERSED GREATER SAPPHENOUS VEIN (Left)  Patient Location: PACU  Anesthesia Type:General  Level of Consciousness: awake, alert  and oriented  Airway and Oxygen Therapy: Patient Spontanous Breathing and Patient connected to nasal cannula oxygen  Post-op Pain: mild  Post-op Assessment: Post-op Vital signs reviewed, Patient's Cardiovascular Status Stable, Respiratory Function Stable, Patent Airway and Pain level controlled  Post-op Vital Signs: stable  Complications: No apparent anesthesia complications 

## 2012-11-27 NOTE — Anesthesia Procedure Notes (Signed)
Procedure Name: Intubation Date/Time: 11/27/2012 11:40 AM Performed by: Gwenyth Allegra Pre-anesthesia Checklist: Patient identified, Timeout performed, Emergency Drugs available, Suction available and Patient being monitored Patient Re-evaluated:Patient Re-evaluated prior to inductionOxygen Delivery Method: Circle system utilized Intubation Type: IV induction Ventilation: Oral airway inserted - appropriate to patient size Laryngoscope Size: 4 and Mac Grade View: Grade II Tube size: 8.0 mm Airway Equipment and Method: Stylet Secured at: 21 cm Tube secured with: Tape Dental Injury: Teeth and Oropharynx as per pre-operative assessment

## 2012-11-28 LAB — CBC
HCT: 32.4 % — ABNORMAL LOW (ref 39.0–52.0)
Hemoglobin: 10.9 g/dL — ABNORMAL LOW (ref 13.0–17.0)
MCH: 32.4 pg (ref 26.0–34.0)
MCV: 96.4 fL (ref 78.0–100.0)
Platelets: 73 10*3/uL — ABNORMAL LOW (ref 150–400)
RBC: 3.36 MIL/uL — ABNORMAL LOW (ref 4.22–5.81)
WBC: 10.2 10*3/uL (ref 4.0–10.5)

## 2012-11-28 LAB — BASIC METABOLIC PANEL
BUN: 25 mg/dL — ABNORMAL HIGH (ref 6–23)
CO2: 26 mEq/L (ref 19–32)
Chloride: 102 mEq/L (ref 96–112)
Creatinine, Ser: 1.27 mg/dL (ref 0.50–1.35)
Potassium: 4.4 mEq/L (ref 3.5–5.1)

## 2012-11-28 MED ORDER — PHENOL 1.4 % MT LIQD
1.0000 | OROMUCOSAL | Status: DC | PRN
  Administered 2012-11-28: 1 via OROMUCOSAL

## 2012-11-28 MED ORDER — OXYCODONE HCL 5 MG PO TABS: 5.0000 mg | ORAL_TABLET | Freq: Four times a day (QID) | ORAL | Status: DC | PRN

## 2012-11-28 NOTE — Progress Notes (Addendum)
    VASCULAR PROGRESS NOTE  SUBJECTIVE: No specific complaints.   PHYSICAL EXAM: Filed Vitals:   11/27/12 2019 11/27/12 2339 11/28/12 0400 11/28/12 0741  BP: 128/51 133/45 116/54 116/51  Pulse: 52 59 55 64  Temp: 97.5 F (36.4 C) 97.6 F (36.4 C) 97.9 F (36.6 C) 98.2 F (36.8 C)  TempSrc: Oral Oral Oral Oral  Resp: 13 14 20 13   Height:      Weight:      SpO2: 98% 100% 100% 99%   Left foot warm and well perfused.  AT signal with doppler Incisions fine.  LABS: Lab Results  Component Value Date   WBC 10.2 11/28/2012   HGB 10.9* 11/28/2012   HCT 32.4* 11/28/2012   MCV 96.4 11/28/2012   PLT 73* 11/28/2012   Lab Results  Component Value Date   CREATININE 1.27 11/28/2012   Lab Results  Component Value Date   INR 0.96 11/24/2012   CBG (last 3)   ASSESSMENT AND PLAN:  * POD 1 s/p left AK pop to BK pop bypass with vein.   * Thrombocytopenia. Hold lovenox. F/U PLT count in AM  * transfer to 2W. Ambulate.  * Anticipate D/C Monday.   Cari Caraway Beeper: [phone] 11/28/2012

## 2012-11-28 NOTE — Progress Notes (Addendum)
[facility]  Progress Note Bypass Surgery  Date of Surgery: 11/26/2012 - 11/27/2012  Procedure(s): BYPASS GRAFT LEFT ABOVE KNEE TO BELOW KNEE POPLITEAL ARTERY USING LEFT NONREVERSED GREATER SAPPHENOUS VEIN Surgeon: Surgeon(s): Nada Libman, MD  1 Day Post-Op  History of Present Illness  Walter [name] is a 79 y.o. male who is  1 Day Post-Op. The patient's pre-op symptoms of pain in left leg are Improved . Patients pain is well controlled.    VASC. LAB Studies:        ABI: pending   Imaging: No results found.  Significant Diagnostic Studies: CBC Lab Results  Component Value Date   WBC 10.2 11/28/2012   HGB 10.9* 11/28/2012   HCT 32.4* 11/28/2012   MCV 96.4 11/28/2012   PLT 73* 11/28/2012    BMET    Component Value Date/Time   NA 136 11/28/2012 0610   K 4.4 11/28/2012 0610   CL 102 11/28/2012 0610   CO2 26 11/28/2012 0610   GLUCOSE 112* 11/28/2012 0610   BUN 25* 11/28/2012 0610   CREATININE 1.27 11/28/2012 0610   CALCIUM 8.6 11/28/2012 0610   GFRNONAA 53* 11/28/2012 0610   GFRAA 62* 11/28/2012 0610    COAG Lab Results  Component Value Date   INR 0.96 11/24/2012   INR 1.1 03/15/2008   INR 1.1 03/14/2008   No results found for this basename: PTT    Physical Examination  BP Readings from Last 3 Encounters:  11/28/12 116/54  11/28/12 116/54  11/24/12 128/60   Temp Readings from Last 3 Encounters:  11/28/12 97.9 F (36.6 C) Oral  11/28/12 97.9 F (36.6 C) Oral  11/24/12 97.7 F (36.5 C)    SpO2 Readings from Last 3 Encounters:  11/28/12 100%  11/28/12 100%  11/24/12 95%   Pulse Readings from Last 3 Encounters:  11/28/12 55  11/28/12 55  11/24/12 48    Pt is A&O x 3 left lower extremity: Incision/s is/are clean,dry.intact, and  healing without hematoma, erythema or drainage Limb is warm; with good color  Left AT pulse is monophasic by Doppler Left Posterior tibial pulse is  absent  Right peroneal pulse is monophasic  by Doppler Right Posterior tibial pulse is  absent   Assessment/Plan: Thrombocytopenia- stopping Lovenox, repeat CBC in AM Pt. Doing well Post-op pain is controlled Wounds are healing well PT/OT for ambulation Transfer to 2W  NICKEL, SUZANNE L  11/28/2012 8:31 AM  Agree with above.   Waverly Ferrari, MD, FACS Beeper [phone] 11/28/2012

## 2012-11-28 NOTE — Progress Notes (Signed)
Report called pt transferring to 2w14 via w/c with belongigns.

## 2012-11-29 LAB — CBC
Hemoglobin: 10 g/dL — ABNORMAL LOW (ref 13.0–17.0)
MCH: 31.8 pg (ref 26.0–34.0)
Platelets: 78 10*3/uL — ABNORMAL LOW (ref 150–400)
RBC: 3.14 MIL/uL — ABNORMAL LOW (ref 4.22–5.81)
RDW: 13.3 % (ref 11.5–15.5)
WBC: 9 10*3/uL (ref 4.0–10.5)

## 2012-11-29 MED ORDER — ALBUTEROL SULFATE (5 MG/ML) 0.5% IN NEBU
2.5000 mg | INHALATION_SOLUTION | RESPIRATORY_TRACT | Status: DC | PRN
  Administered 2012-11-29: 2.5 mg via RESPIRATORY_TRACT
  Filled 2012-11-29: qty 0.5

## 2012-11-29 MED ORDER — OXYCODONE-ACETAMINOPHEN 5-325 MG PO TABS
1.0000 | ORAL_TABLET | ORAL | Status: DC | PRN
  Administered 2012-11-29 – 2012-11-30 (×3): 2 via ORAL
  Filled 2012-11-29 (×3): qty 2

## 2012-11-29 NOTE — Progress Notes (Signed)
11/29/2012 6:25 PM Nursing note 1745 Rn called to pt. Room pt. C/o intermittent expiratory wheezing. Pt. States he feels "better than he has in days" except for ability to hear occasional wheezing. Denies SOB or chest tightness. Upon initial RN assessment of concerns, lung sounds clear. Dr. Edilia Bo paged and made aware. No orders received at this time. Incentive spirometry use encouraged as well as cough and deep breathe.  1820 Upon RN assistance of pt. Back to bed, RN did note expiratory wheezing in upper lobes after activity per auscultation. Pt. Still denies SOB or chest tightness. Oxygen saturation 98 % on Room air. Dr. Edilia Bo paged and made aware of change in assessment. Telephone orders received for Albuterol nebs Q4H prn for wheezing. Respiratory paged and mage aware of new orders. Will continue to monitor patient.  Walter [name], Blanchard Kelch

## 2012-11-29 NOTE — Progress Notes (Signed)
   VASCULAR PROGRESS NOTE  SUBJECTIVE: Complains of pain in left great toe which he had preop. Denies history of gout.   PHYSICAL EXAM: Filed Vitals:   11/28/12 0741 11/28/12 1024 11/28/12 2008 11/29/12 0455  BP: 116/51 124/63 149/53 134/64  Pulse: 64 67 68 61  Temp: 98.2 F (36.8 C) 98.7 F (37.1 C) 98.7 F (37.1 C) 98.7 F (37.1 C)  TempSrc: Oral Oral Oral Oral  Resp: 13 18 18 20   Height:      Weight:      SpO2: 99% 97% 99% 96%   Palpable graft pulse in popliteal fossa. Left foot warm and well perfused.  Small amount of drainage from AK incision and BK incision. Left great toe: no drainage or ulcer  LABS: Lab Results  Component Value Date   WBC 9.0 11/29/2012   HGB 10.0* 11/29/2012   HCT 30.2* 11/29/2012   MCV 96.2 11/29/2012   PLT 78* 11/29/2012   Lab Results  Component Value Date   CREATININE 1.27 11/28/2012   Lab Results  Component Value Date   INR 0.96 11/24/2012   CBG (last 3)  No results found for this basename: GLUCAP,  in the last 72 hours  ASSESSMENT AND PLAN:  * POD 2 s/p left AK pop to BK pop bypass with vein.   * Thrombocytopenia. Lovenox D/C'd. PLT count slightly improved. F/U PLT count in AM.  * Ambulate.  * He complained that pain was not adequately controlled. I increased his Oxy to q 3 hours.    * Anticipate D/C Monday/ Tuesday.   Cari Caraway Beeper: [phone] 11/29/2012

## 2012-11-29 NOTE — Evaluation (Signed)
Physical Therapy Evaluation Patient Details Name: Walter [name] MRN: [id number] DOB: Dec 30, 1933 Today's Date: 11/29/2012 Time: 4098-1191 PT Time Calculation (min): 17 min  PT Assessment / Plan / Recommendation History of Present Illness  This is a very pleasant 79 year old gentleman who was referred today for evaluation of peripheral vascular disease.  The patient states that he has been having problems with both legs for several years but they have gotten worse over the past couple of months.  He recently underwent angiography which revealed diffuse disease throughout the right distal superficial femoral and above-knee popliteal artery, and an occluded left popliteal artery.  The patient reports having difficulty sleeping at night secondary to pain which he describes as the feeling of nails sticking into his feet.  He does report early ulceration on the left heel.  His ambulation is significantly impaired.  The left leg bothers her more than the right.  Patient is now s/p Lt LE bypass graft.  Clinical Impression  Patient presents with problems listed below.  Will benefit from acute PT to maximize independence prior to discharge home with wife.  Will need RW for home use.    PT Assessment  Patient needs continued PT services    Follow Up Recommendations  No PT follow up;Supervision/Assistance - 24 hour    Does the patient have the potential to tolerate intense rehabilitation      Barriers to Discharge        Equipment Recommendations  Rolling walker with 5" wheels    Recommendations for Other Services     Frequency Min 4X/week    Precautions / Restrictions Precautions Precautions: None Restrictions Weight Bearing Restrictions: No   Pertinent Vitals/Pain Pain limiting mobility      Mobility  Bed Mobility Bed Mobility: Supine to Sit;Sitting - Scoot to Edge of Bed Supine to Sit: 5: Supervision Sitting - Scoot to Edge of Bed: 5: Supervision Details for Bed Mobility  Assistance: Assist for safety only. Transfers Transfers: Sit to Stand;Stand to Sit Sit to Stand: 4: Min assist;With upper extremity assist;From bed Stand to Sit: 4: Min guard;With upper extremity assist;With armrests;To chair/3-in-1 Details for Transfer Assistance: Verbal cues for safe hand placement and safe use of RW.  Assist to rise to standing and for balance initially. Ambulation/Gait Ambulation/Gait Assistance: 4: Min guard Ambulation Distance (Feet): 180 Feet Assistive device: Rolling walker Ambulation/Gait Assistance Details: Verbal cues for safe use of RW and gait sequence.  Cues to stand upright during gait. Gait Pattern: Step-through pattern;Decreased stance time - left;Decreased step length - right;Antalgic;Trunk flexed Gait velocity: Slow gait speed    Exercises     PT Diagnosis: Difficulty walking;Acute pain  PT Problem List: Decreased strength;Decreased range of motion;Decreased activity tolerance;Decreased balance;Decreased mobility;Decreased knowledge of use of DME;Pain PT Treatment Interventions: DME instruction;Gait training;Functional mobility training;Patient/family education     PT Goals(Current goals can be found in the care plan section) Acute Rehab PT Goals Patient Stated Goal: To go home soon PT Goal Formulation: With patient Time For Goal Achievement: 12/06/12 Potential to Achieve Goals: Good  Visit Information  Last PT Received On: 11/29/12 Assistance Needed: +1 History of Present Illness: This is a very pleasant 79 year old gentleman who was referred today for evaluation of peripheral vascular disease.  The patient states that he has been having problems with both legs for several years but they have gotten worse over the past couple of months.  He recently underwent angiography which revealed diffuse disease throughout the right distal superficial femoral and above-knee  popliteal artery, and an occluded left popliteal artery.  The patient reports having  difficulty sleeping at night secondary to pain which he describes as the feeling of nails sticking into his feet.  He does report early ulceration on the left heel.  His ambulation is significantly impaired.  The left leg bothers her more than the right.  Patient is now s/p Lt LE bypass graft.       Prior Functioning  Home Living Family/patient expects to be discharged to:: Private residence Living Arrangements: Spouse/significant other Available Help at Discharge: Family;Available 24 hours/day Type of Home: House Home Access: Level entry Home Layout: One level Home Equipment: Other (comment) (Handicapped toilet; Walk-in tub with seat) Prior Function Level of Independence: Independent Comments: Gait impacted by pain LLE. Communication Communication: No difficulties    Cognition  Cognition Arousal/Alertness: Awake/alert Behavior During Therapy: WFL for tasks assessed/performed Overall Cognitive Status: Within Functional Limits for tasks assessed    Extremity/Trunk Assessment Upper Extremity Assessment Upper Extremity Assessment: Overall WFL for tasks assessed Lower Extremity Assessment Lower Extremity Assessment: LLE deficits/detail LLE Deficits / Details: Strength and ROM decreased due to surgery/pain.  Able to move LLE against gravity. LLE: Unable to fully assess due to pain   Balance    End of Session PT - End of Session Equipment Utilized During Treatment: Gait belt Activity Tolerance: Patient limited by pain Patient left: in chair;with call bell/phone within reach Nurse Communication: Mobility status  GP     Vena Austria 11/29/2012, 4:22 PM  Durenda Hurt. Renaldo Fiddler, [facility] Pager [phone]

## 2012-11-30 ENCOUNTER — Telehealth: Payer: Self-pay | Admitting: Surgery

## 2012-11-30 LAB — CBC
HCT: 29.5 % — ABNORMAL LOW (ref 39.0–52.0)
Hemoglobin: 9.8 g/dL — ABNORMAL LOW (ref 13.0–17.0)
MCV: 96.4 fL (ref 78.0–100.0)
RBC: 3.06 MIL/uL — ABNORMAL LOW (ref 4.22–5.81)
RDW: 13.1 % (ref 11.5–15.5)
WBC: 8.5 10*3/uL (ref 4.0–10.5)

## 2012-11-30 NOTE — Discharge Summary (Signed)
[facility] Discharge Summary   Patient ID:  Walter [name] MRN: [id number] DOB/AGE: Nov 29, 1933 79 y.o.  Admit date: 11/27/2012 Discharge date: 11/30/2012 Date of Surgery: 11/26/2012 - 11/27/2012 Surgeon: Surgeon(s): Nada Libman, MD  Admission Diagnosis: Peripheral Vascular Disease  Discharge Diagnoses:  Peripheral Vascular Disease  Secondary Diagnoses: Past Medical History  Diagnosis Date  . Anxiety   . Arthritis   . Hypertension   . Thyroid disease     hypothyroidism  . Barrett's esophagus   . Diverticulosis   . Colon polyps     tubular adenoma-last colon 12/18/2005  . Hiatal hernia   . Depression   . Hematoma     on mid back  . Hypothyroidism     takes synthroid  . GERD (gastroesophageal reflux disease)     takes omeprazole    Procedure(s): BYPASS GRAFT LEFT ABOVE KNEE TO BELOW KNEE POPLITEAL ARTERY USING LEFT NONREVERSED GREATER SAPPHENOUS VEIN  Discharged Condition: good  HPI: CEASAR [name] is a 79 y.o. male is a very pleasant gentleman who was referred today for evaluation of peripheral vascular disease. The patient states that he has been having problems with both legs for several years but they have gotten worse over the past couple of months. He recently underwent angiography which revealed diffuse disease throughout the right distal superficial femoral and above-knee popliteal artery, and an occluded left popliteal artery. The patient reports having difficulty sleeping at night secondary to pain which he describes as the feeling of nails sticking into his feet. He does report early ulceration on the left heel. His ambulation is significantly impaired. The left leg bothers her more than the right.  The patient has been told he has hypercholesterolemia. He has been given a prescription for a statin, however he has elected to treat this initially with diet control. He is also medically managed for hypertension. He is on aspirin and Plavix. He  did have a trial of condyle, however this was recently discontinued. He has a history of smoking but quit approximately 30 years ago. There were no aneurysmal or atherosclerotic problems and his family.   Hospital Course:  DAELIN HASTE is a 79 y.o. male is  Left above knee popliteal knee popliteal artery bypass graft with ipsilateral non-reversed translocated greater saphenous vein Extubated: POD # 0 Continues to have pain in left great toe as pre-op. No other issues.  transferred to floor on POD#1 Physical exam: Palpable graft pulse in popliteal fossa.  Left foot warm and well perfused.  Small amount of drainage from AK incision and BK incision.  Left great toe: no drainage or ulcer  Post-op wounds healing well Pt. Ambulating, voiding and taking PO diet without difficulty. Pt pain controlled with PO pain meds. Labs as below Complications:none  Consults:     Significant Diagnostic Studies: CBC Lab Results  Component Value Date   WBC 9.0 11/29/2012   HGB 10.0* 11/29/2012   HCT 30.2* 11/29/2012   MCV 96.2 11/29/2012   PLT 78* 11/29/2012    BMET    Component Value Date/Time   NA 136 11/28/2012 0610   K 4.4 11/28/2012 0610   CL 102 11/28/2012 0610   CO2 26 11/28/2012 0610   GLUCOSE 112* 11/28/2012 0610   BUN 25* 11/28/2012 0610   CREATININE 1.27 11/28/2012 0610   CALCIUM 8.6 11/28/2012 0610   GFRNONAA 53* 11/28/2012 0610   GFRAA 62* 11/28/2012 0610   COAG Lab Results  Component Value Date  INR 0.96 11/24/2012   INR 1.1 03/15/2008   INR 1.1 03/14/2008     Disposition:  Discharge to :Home Discharge Orders   Future Orders Complete By Expires   Call MD for:  redness, tenderness, or signs of infection (pain, swelling, bleeding, redness, odor or green/yellow discharge around incision site)  As directed    Call MD for:  severe or increased pain, loss or decreased feeling  in affected limb(s)  As directed    Call MD for:  temperature >100.5  As directed    Discharge  patient  As directed    Comments:     Discharge pt to home   Driving Restrictions  As directed    Comments:     No driving for 3 weeks   Increase activity slowly  As directed    Comments:     Walk with assistance use walker or cane as needed   May shower   As directed    may wash over wound with mild soap and water  As directed    No dressing needed  As directed    Resume previous diet  As directed        Medication List    STOP taking these medications       TYLENOL ARTHRITIS PAIN 650 MG CR tablet  Generic drug:  acetaminophen      TAKE these medications       ALPRAZolam 0.5 MG tablet  Commonly known as:  XANAX  Take 0.5 mg by mouth at bedtime as needed for anxiety.     clopidogrel 75 MG tablet  Commonly known as:  PLAVIX  Take 1 tablet (75 mg total) by mouth daily.     FLUoxetine 20 MG capsule  Commonly known as:  PROZAC  Take 20 mg by mouth at bedtime.     levothyroxine 125 MCG tablet  Commonly known as:  SYNTHROID, LEVOTHROID  Take 125 mcg by mouth daily before breakfast.     metoprolol succinate 50 MG 24 hr tablet  Commonly known as:  TOPROL-XL  Take 25 mg by mouth daily. Take with or immediately following a meal.     mupirocin ointment 2 %  Commonly known as:  BACTROBAN  1 Application, nasally, 2 times daily for 5 days prior to surgery     OMEGA 3 PO  Take 2 capsules by mouth daily.     omeprazole 40 MG capsule  Commonly known as:  PRILOSEC  Take 40 mg by mouth daily.     OVER THE COUNTER MEDICATION  Place 2 sprays into both nostrils 2 (two) times daily. Over the counter nasal spray.     oxyCODONE 5 MG immediate release tablet  Commonly known as:  Oxy IR/ROXICODONE  Take 1-2 tablets (5-10 mg total) by mouth every 6 (six) hours as needed for moderate pain.     PROBIOTIC PEARLS ADVANTAGE PO  Take 1 capsule by mouth daily.     PROBIOTIC DAILY PO  Take 1 tablet by mouth daily.       Verbal and written Discharge instructions given to the  patient. Wound care per Discharge AVS     Follow-up Information   Follow up with [name] IV, Lala Lund, MD In 3 weeks. (office will arrange-sent)    Specialty:  Vascular Surgery   Contact information:   [address] [place] [address] [phone]       Signed: Marlowe Shores 11/30/2012, 7:33 AM  - For VQI Registry use ---  Instructions: Press F2 to tab through selections.  Delete question if not applicable.   Post-op:  Wound infection: No  Graft infection: No  Transfusion: No   New Arrhythmia: No Ipsilateral amputation: [ x] no, [ ]  Minor, [ ]  BKA, [ ]  AKA Discharge patency: [ x] Primary, [ ]  Primary assisted, [ ]  Secondary, [ ]  Occluded Patency judged by: [ ]  Dopper only, [x ] Palpable graft pulse, [ ]  Palpable distal pulse, [ ]  ABI inc. > 0.15, [ ]  Duplex D/C Ambulatory Status: Ambulatory  Complications: MI: [ ]  No, [ ]  Troponin only, [ ]  EKG or Clinical CHF: No Resp failure: [ ]  none, [ ]  Pneumonia, [ ]  Ventilator Chg in renal function: [ ]  none, [ ]  Inc. Cr > 0.5, [ ]  Temp. Dialysis, [ ]  Permanent dialysis Stroke: [ ]  None, [ ]  Minor, [ ]  Major Return to OR: No  Reason for return to OR: [ ]  Bleeding, [ ]  Infection, [ ]  Thrombosis, [ ]  Revision  Discharge medications: Statin use:  Yes ASA use:no Plavix use:  Yes Beta blocker use: No  for medical reason not indicated Coumadin use: No  for medical reason not indicated

## 2012-11-30 NOTE — Progress Notes (Signed)
Pt ambulated 150 ft x1 assist with front wheel walker. Pt ambulated on RA. Pt tolerated well with no complications or pain. Will continue to monitor.

## 2012-11-30 NOTE — Care Management Note (Signed)
    Page 1 of 1   11/30/2012     4:20:50 PM   CARE MANAGEMENT NOTE 11/30/2012  Patient:  Walter [name], Walter [name]   Account Number:  [id number]  Date Initiated:  11/30/2012  Documentation initiated by:  Marthella Osorno  Subjective/Objective Assessment:   PT S/P LT FEM POP ON 11/30/12.  PTA, PT INDEPENDENT, LIVES WITH SPOUSE.     Action/Plan:   PT FOR DC HOME TODAY; NO DC NEEDS.   Anticipated DC Date:  11/30/2012   Anticipated DC Plan:  HOME/SELF CARE      DC Planning Services  CM consult      Choice offered to / List presented to:             Status of service:  Completed, signed off Medicare Important Message given?   (If response is "NO", the following Medicare IM given date fields will be blank) Date Medicare IM given:   Date Additional Medicare IM given:    Discharge Disposition:  HOME/SELF CARE  Per UR Regulation:  Reviewed for med. necessity/level of care/duration of stay  If discussed at Long Length of Stay Meetings, dates discussed:    Comments:

## 2012-11-30 NOTE — Telephone Encounter (Addendum)
Message copied by Fredrich Birks on Mon Nov 30, 2012 11:21 AM ------      Message from: Melene Plan      Created: Mon Nov 30, 2012  9:54 AM                   ----- Message -----         From: Annye Rusk, NP         Sent: 11/28/2012   9:25 AM           To: Melene Plan, RN, [facility]            3-4 week follow up, fem-pop. [name] ------  11/30/12: spoke with pts wife, dpm

## 2012-12-01 ENCOUNTER — Encounter (HOSPITAL_COMMUNITY): Payer: Self-pay | Admitting: Surgery

## 2012-12-02 NOTE — Discharge Summary (Signed)
I agree with the above  Wells Brabham 

## 2012-12-25 DIAGNOSIS — D696 Thrombocytopenia, unspecified: Secondary | ICD-10-CM | POA: Diagnosis not present

## 2012-12-30 ENCOUNTER — Encounter: Payer: Self-pay | Admitting: Surgery

## 2013-01-04 ENCOUNTER — Encounter: Payer: Self-pay | Admitting: Surgery

## 2013-01-04 ENCOUNTER — Ambulatory Visit (INDEPENDENT_AMBULATORY_CARE_PROVIDER_SITE_OTHER): Payer: Self-pay | Admitting: Surgery

## 2013-01-04 VITALS — BP 134/50 | HR 48 | Ht 69.0 in | Wt 197.1 lb

## 2013-01-04 DIAGNOSIS — I7025 Atherosclerosis of native arteries of other extremities with ulceration: Secondary | ICD-10-CM | POA: Insufficient documentation

## 2013-01-04 DIAGNOSIS — Z48812 Encounter for surgical aftercare following surgery on the circulatory system: Secondary | ICD-10-CM

## 2013-01-04 DIAGNOSIS — I739 Peripheral vascular disease, unspecified: Secondary | ICD-10-CM

## 2013-01-04 NOTE — Progress Notes (Signed)
The patient is here today for his first postoperative visit.  On 1120 he underwent a left femoral to above-knee popliteal artery bypass graft with ipsilateral non-reversed translocated greater saphenous vein.  This was done for an early ischemic ulcer.  He is done for her well in his postoperative period.  He does complain of some drainage from his lowest incision.  On examination all his incisions have healed except for a 1 inch long area and the lowest incision.  I remove some eschar from this area.  It is extremely superficial.  Silver nitrate was placed over top of the exposed area which had about a 2 mm separation.  The patient has a palpable anterior tibial artery pulse.  Overall he is doing very well.  I have told him to keep a dry dressing over top of the wound.  He will followup in one month for a wound check.  I have also scheduled him for a Doppler studies in 3 months.  He does have some swelling in his left leg.  I told him to keep his leg elevated when resting.  I will consider the use of a compression stocking once his distal vein harvest incision has completely healed.

## 2013-01-06 DIAGNOSIS — Z79899 Other long term (current) drug therapy: Secondary | ICD-10-CM | POA: Diagnosis not present

## 2013-01-06 DIAGNOSIS — E039 Hypothyroidism, unspecified: Secondary | ICD-10-CM | POA: Diagnosis not present

## 2013-01-06 DIAGNOSIS — E782 Mixed hyperlipidemia: Secondary | ICD-10-CM | POA: Diagnosis not present

## 2013-02-01 ENCOUNTER — Ambulatory Visit: Payer: Medicare Other | Admitting: Surgery

## 2013-03-22 DIAGNOSIS — M79609 Pain in unspecified limb: Secondary | ICD-10-CM | POA: Diagnosis not present

## 2013-03-22 DIAGNOSIS — L600 Ingrowing nail: Secondary | ICD-10-CM | POA: Diagnosis not present

## 2013-03-23 ENCOUNTER — Telehealth: Payer: Self-pay

## 2013-03-23 DIAGNOSIS — E039 Hypothyroidism, unspecified: Secondary | ICD-10-CM | POA: Diagnosis not present

## 2013-03-23 DIAGNOSIS — K219 Gastro-esophageal reflux disease without esophagitis: Secondary | ICD-10-CM | POA: Diagnosis not present

## 2013-03-23 DIAGNOSIS — I10 Essential (primary) hypertension: Secondary | ICD-10-CM | POA: Diagnosis not present

## 2013-03-23 DIAGNOSIS — E7800 Pure hypercholesterolemia, unspecified: Secondary | ICD-10-CM | POA: Diagnosis not present

## 2013-03-23 DIAGNOSIS — I70219 Atherosclerosis of native arteries of extremities with intermittent claudication, unspecified extremity: Secondary | ICD-10-CM | POA: Diagnosis not present

## 2013-03-23 DIAGNOSIS — M159 Polyosteoarthritis, unspecified: Secondary | ICD-10-CM | POA: Diagnosis not present

## 2013-03-23 DIAGNOSIS — N183 Chronic kidney disease, stage 3 unspecified: Secondary | ICD-10-CM | POA: Diagnosis not present

## 2013-03-23 DIAGNOSIS — F411 Generalized anxiety disorder: Secondary | ICD-10-CM | POA: Diagnosis not present

## 2013-03-23 MED ORDER — OMEPRAZOLE 40 MG PO CPDR: 40.0000 mg | DELAYED_RELEASE_CAPSULE | Freq: Every day | ORAL | Status: DC

## 2013-03-23 NOTE — Telephone Encounter (Signed)
Refilled Omeprazole; pt needs office visit for further refills

## 2013-04-01 DIAGNOSIS — M12279 Villonodular synovitis (pigmented), unspecified ankle and foot: Secondary | ICD-10-CM | POA: Diagnosis not present

## 2013-04-16 ENCOUNTER — Encounter: Payer: Self-pay | Admitting: Surgery

## 2013-04-19 ENCOUNTER — Ambulatory Visit (INDEPENDENT_AMBULATORY_CARE_PROVIDER_SITE_OTHER): Payer: Medicare Other | Admitting: Surgery

## 2013-04-19 ENCOUNTER — Other Ambulatory Visit (HOSPITAL_COMMUNITY): Payer: Medicare Other

## 2013-04-19 ENCOUNTER — Ambulatory Visit: Payer: Medicare Other | Admitting: Surgery

## 2013-04-19 ENCOUNTER — Encounter: Payer: Self-pay | Admitting: Surgery

## 2013-04-19 ENCOUNTER — Ambulatory Visit (HOSPITAL_COMMUNITY)
Admission: RE | Admit: 2013-04-19 | Discharge: 2013-04-19 | Disposition: A | Discharge status: Home / Self Care | Payer: Medicare Other | Source: Ambulatory Visit | Attending: Surgery | Admitting: Surgery

## 2013-04-19 ENCOUNTER — Ambulatory Visit (INDEPENDENT_AMBULATORY_CARE_PROVIDER_SITE_OTHER)
Admission: RE | Admit: 2013-04-19 | Discharge: 2013-04-19 | Disposition: A | Discharge status: Home / Self Care | Payer: Medicare Other | Source: Ambulatory Visit | Attending: Surgery | Admitting: Surgery

## 2013-04-19 VITALS — BP 172/56 | HR 45 | Ht 69.0 in | Wt 191.6 lb

## 2013-04-19 DIAGNOSIS — L98499 Non-pressure chronic ulcer of skin of other sites with unspecified severity: Principal | ICD-10-CM

## 2013-04-19 DIAGNOSIS — Z48812 Encounter for surgical aftercare following surgery on the circulatory system: Secondary | ICD-10-CM | POA: Diagnosis not present

## 2013-04-19 DIAGNOSIS — I739 Peripheral vascular disease, unspecified: Secondary | ICD-10-CM

## 2013-04-19 NOTE — Progress Notes (Signed)
Patient name: Walter [name] MRN: [id number] DOB: 10/20/1933 Sex: male     Chief Complaint  Patient presents with  . Re-evaluation    3 month f/u     HISTORY OF PRESENT ILLNESS: On 1120 he underwent a left femoral to above-knee popliteal artery bypass graft with ipsilateral non-reversed translocated greater saphenous vein. This was done for an early ischemic ulcer.  He had some slight wound issues postoperatively which is now completely resolved.  He still complains of swelling in his left leg.  He has recently had a ingrown toenail removed.   Past Medical History  Diagnosis Date  . Anxiety   . Arthritis   . Hypertension   . Thyroid disease     hypothyroidism  . Barrett's esophagus   . Diverticulosis   . Colon polyps     tubular adenoma-last colon 12/18/2005  . Hiatal hernia   . Depression   . Hematoma     on mid back  . Hypothyroidism     takes synthroid  . GERD (gastroesophageal reflux disease)     takes omeprazole    Past Surgical History  Procedure Laterality Date  . Prostate surgery    . Tonsillectomy    . Pyloroplasty    . Polypectomy    . Colonoscopy    . Upper gastrointestinal endoscopy    . Cardiac catheterization    . Cataract surgery      both eyes  . Eye surgery    . Femoral-popliteal bypass graft Left 11/27/2012    Procedure: BYPASS GRAFT LEFT ABOVE KNEE TO BELOW KNEE POPLITEAL ARTERY USING LEFT NONREVERSED GREATER SAPPHENOUS VEIN;  Surgeon: Serafina Mitchell, MD;  Location: [facility];  Service: Vascular;  Laterality: Left;    History   Social History  . Marital Status: Married    Spouse Name: N/A    Number of Children: N/A  . Years of Education: N/A   Occupational History  . Retired    Social History Main Topics  . Smoking status: Former Smoker    Types: Cigarettes    Quit date: 06/28/1985  . Smokeless tobacco: Never Used  . Alcohol Use: No  . Drug Use: No  . Sexual Activity: Not on file   Other Topics Concern  . Not on file    Social History Narrative  . No narrative on file    Family History  Problem Relation Age of Onset  . Colon cancer Neg Hx   . Heart disease Mother   . Lung cancer Brother     and sister  . Cancer Brother   . Melanoma Father   . Cancer Father   . Alzheimer's disease Sister     x 2  . Heart disease Sister   . Hypertension Sister     Allergies as of 04/19/2013 - Review Complete 04/19/2013  Allergen Reaction Noted  . Penicillins Other (See Comments)     Current Outpatient Prescriptions on File Prior to Visit  Medication Sig Dispense Refill  . ALPRAZolam (XANAX) 0.5 MG tablet Take 0.5 mg by mouth at bedtime as needed for anxiety.      . clopidogrel (PLAVIX) 75 MG tablet Take 1 tablet (75 mg total) by mouth daily.  90 tablet  3  . FLUoxetine (PROZAC) 20 MG capsule Take 20 mg by mouth at bedtime.       Marland Kitchen levothyroxine (SYNTHROID, LEVOTHROID) 125 MCG tablet Take 125 mcg by mouth daily before breakfast.      .  metoprolol succinate (TOPROL-XL) 50 MG 24 hr tablet Take 25 mg by mouth daily. Take with or immediately following a meal.      . mupirocin ointment (BACTROBAN) 2 % 1 Application, nasally, 2 times daily for 5 days prior to surgery  22 g  0  . Omega-3 Fatty Acids (OMEGA 3 PO) Take 2 capsules by mouth daily.      Marland Kitchen omeprazole (PRILOSEC) 40 MG capsule Take 1 capsule (40 mg total) by mouth daily.  90 capsule  0  . OVER THE COUNTER MEDICATION Place 2 sprays into both nostrils 2 (two) times daily. Over the counter nasal spray.      Marland Kitchen oxyCODONE (OXY IR/ROXICODONE) 5 MG immediate release tablet Take 1-2 tablets (5-10 mg total) by mouth every 6 (six) hours as needed for moderate pain.  30 tablet  0  . Probiotic Product (PROBIOTIC DAILY PO) Take 1 tablet by mouth daily.      . Probiotic Product (PROBIOTIC PEARLS ADVANTAGE PO) Take 1 capsule by mouth daily.       No current facility-administered medications on file prior to visit.     REVIEW OF SYSTEMS: Please see history of present  illness, otherwise all systems negative  PHYSICAL EXAMINATION:   Vital signs are BP 172/56  Pulse 45  Ht 5\' 9"  (1.753 m)  Wt 191 lb 9.6 oz (86.909 kg)  BMI 28.28 kg/m2  SpO2 100% General: The patient appears their stated age. HEENT:  No gross abnormalities Pulmonary:  Non labored breathing Musculoskeletal: There are no major deformities. Neurologic: No focal weakness or paresthesias are detected, Skin: Granulation tissue present on the left great toe where the nail that has been removed. Psychiatric: The patient has normal affect. Cardiovascular: 2+ pitting edema in the left leg from the knee down.  Pulses are not palpable in the foot.  All incisions have healed   Diagnostic Studies Ultrasound was performed today.  This shows the bypass graft to be widely patent.  The ankle-brachial index on the left is 1.05.  On the right is 0.55  Assessment: Peripheral vascular disease, status post left leg bypass  Plan: The patient's bypass graft is widely patent by ultrasound today.  I have recommended repeat ultrasound in 6 months.  With regards to the swelling, now that his incisions have completely healed, I have given him prescription for 20-30 mm knee-high compression stockings. The patient's ankle-brachial index has dropped from 0.8 down to 0.55.  He has not had any significant change in his symptomatology.  He did not have any ulcers currently.  I have recommended continued observation and preserving intervention for more significant symptoms vs. nonhealing ulcer  V. Leia Alf, M.D. [facility] Office: [phone] Pager:  [phone]

## 2013-04-20 ENCOUNTER — Other Ambulatory Visit: Payer: Self-pay | Admitting: Surgery

## 2013-04-20 DIAGNOSIS — I739 Peripheral vascular disease, unspecified: Secondary | ICD-10-CM

## 2013-04-20 DIAGNOSIS — Z48812 Encounter for surgical aftercare following surgery on the circulatory system: Secondary | ICD-10-CM

## 2013-04-20 DIAGNOSIS — L98499 Non-pressure chronic ulcer of skin of other sites with unspecified severity: Principal | ICD-10-CM

## 2013-04-20 NOTE — Addendum Note (Signed)
Addended by: Dorthula Rue L on: 04/20/2013 03:43 PM   Modules accepted: Orders

## 2013-04-26 DIAGNOSIS — T8189XA Other complications of procedures, not elsewhere classified, initial encounter: Secondary | ICD-10-CM | POA: Diagnosis not present

## 2013-05-10 DIAGNOSIS — L600 Ingrowing nail: Secondary | ICD-10-CM | POA: Diagnosis not present

## 2013-05-19 DIAGNOSIS — T8189XA Other complications of procedures, not elsewhere classified, initial encounter: Secondary | ICD-10-CM | POA: Diagnosis not present

## 2013-06-01 ENCOUNTER — Encounter: Payer: Self-pay | Admitting: Vascular Surgery

## 2013-06-01 ENCOUNTER — Telehealth: Payer: Self-pay

## 2013-06-01 ENCOUNTER — Telehealth: Payer: Self-pay | Admitting: Vascular Surgery

## 2013-06-01 ENCOUNTER — Ambulatory Visit (INDEPENDENT_AMBULATORY_CARE_PROVIDER_SITE_OTHER): Payer: Medicare Other | Admitting: Vascular Surgery

## 2013-06-01 ENCOUNTER — Other Ambulatory Visit: Payer: Self-pay | Admitting: Vascular Surgery

## 2013-06-01 VITALS — BP 155/55 | HR 61 | Temp 99.0°F | Ht 69.0 in | Wt 192.0 lb

## 2013-06-01 DIAGNOSIS — I739 Peripheral vascular disease, unspecified: Secondary | ICD-10-CM | POA: Diagnosis not present

## 2013-06-01 DIAGNOSIS — T8140XA Infection following a procedure, unspecified, initial encounter: Secondary | ICD-10-CM | POA: Diagnosis not present

## 2013-06-01 DIAGNOSIS — M79609 Pain in unspecified limb: Secondary | ICD-10-CM | POA: Diagnosis not present

## 2013-06-01 MED ORDER — LEVOFLOXACIN 500 MG PO TABS: 500.0000 mg | ORAL_TABLET | Freq: Every day | ORAL | Status: DC

## 2013-06-01 MED ORDER — CEPHALEXIN 500 MG PO CAPS: 500.0000 mg | ORAL_CAPSULE | Freq: Four times a day (QID) | ORAL | Status: DC

## 2013-06-01 NOTE — Telephone Encounter (Signed)
Wife called to report onset of "red, hard, hot mass" on (L) LE, on inner aspect above knee 1 day ago.  Stated the mass is about diameter of cantalope, but not raised as much.  Reported pt. is in "excruciating pain".  Denies any open sore.  Denies any drainage.  Stated "he cannot walk.  Denies swelling of the left foot or lower leg.  Requests appt. today.  Discussed w/ Dr. Donnetta Hutching.  Advised to add on to either MD schedule that has opening.  Appt. given for 3:30 PM today with Dr. Kellie Simmering.  Wife verb understanding.

## 2013-06-01 NOTE — Telephone Encounter (Signed)
Pt wife called 7 pm tonight.  Received antibiotic prescription from Dr Kellie Simmering earlier today for Keflex for leg cellulitis.  Pt has had bad skin reaction to penicillin in past and wanted a different antibiotic.  I sent in a prescription for Levaquin 500 mg #14 dispensed via [facility]. Pt has follow up with our nurse practitioner later this week.  Ruta Hinds, MD [facility] Office: [phone] Pager: [phone]

## 2013-06-01 NOTE — Telephone Encounter (Signed)
Message copied by Denman George on Tue Jun 01, 2013 12:29 PM ------      Message from: Merleen Nicely      Created: Tue Jun 01, 2013  9:29 AM      Regarding: pt. wants to be seen asap      Contact: [place] wants to be seen ASAP. He says site above the knee is swollen,hard,red, sore, and feels hot. Please call.            Thanks,      Ebony Hail ------

## 2013-06-01 NOTE — Progress Notes (Signed)
Subjective:     Patient ID: Walter [name], male   DOB: Mar 29, 1933, 80 y.o.   MRN: [id number]  HPI this 80 year old male came to the office today complaining of pain in his left thigh. Femoral-popliteal vein graft performed by Dr. Trula Slade 6 months ago for an ischemic ulcer of the left foot which has subsequently healed. He has been doing well and did notice a small lump in his medial thigh previously but developed pain and redness over the last few days. He's had increasing swelling as well. He has had some chills at home but his temperature has not been checked.  Review of Systems     Objective:   Physical Exam BP 155/55  Pulse 61  Temp(Src) 99 F (37.2 C) (Oral)  Ht 5\' 9"  (1.753 m)  Wt 192 lb (87.091 kg)  BMI 28.34 kg/m2  SpO2 99%  General well-developed well-nourished male no apparent stress alert and oriented x3 Left thigh with area of erythema and tenseness medially over the vein harvesting incision. This measures about 8 x 10 cm and slightly fluctuant. We ultrasounded this area at the bedside it appears that there is a fluid collection beneath the skin measuring about 8 x 3 cm.  Today I aspirated up proximally 40 cc of clear lymphatic fluid from the subcutaneous tissue in the distal thigh vein harvesting incision. It was clear and did not appear infected but was cultured     Assessment:     Cellulitis left thigh with large lymphocele-aspirated today with no evidence grossly of infection of lymphatic fluid -cultures pending    Plan:     We'll start patient on Keflex 500 mg by mouth 4 times a day for 2 weeks Will return in 2 days to see nurse practitioner for followup unless start having chills and high fever at home and if that occurs she will contact us

## 2013-06-02 ENCOUNTER — Encounter: Payer: Self-pay | Admitting: Family

## 2013-06-03 ENCOUNTER — Other Ambulatory Visit: Payer: Self-pay

## 2013-06-03 ENCOUNTER — Encounter: Payer: Self-pay | Admitting: Family

## 2013-06-03 ENCOUNTER — Ambulatory Visit (INDEPENDENT_AMBULATORY_CARE_PROVIDER_SITE_OTHER): Payer: Medicare Other | Admitting: Family

## 2013-06-03 VITALS — BP 110/66 | HR 62 | Temp 97.9°F | Resp 16 | Ht 69.0 in | Wt 190.0 lb

## 2013-06-03 DIAGNOSIS — M79609 Pain in unspecified limb: Secondary | ICD-10-CM | POA: Diagnosis not present

## 2013-06-03 DIAGNOSIS — T8140XA Infection following a procedure, unspecified, initial encounter: Secondary | ICD-10-CM

## 2013-06-03 NOTE — Progress Notes (Signed)
[facility] [place] HISTORY AND PHYSICAL -PAD  History of Present Illness Walter [name] is a 80 y.o. male patient of Dr. Kellie Simmering who returns today for check of left upper thigh lymphocele drained by Dr. Kellie Simmering at patient's office visit 2 days ago. He is s/p left femoral popliteal bypass graft using non reversed greater saphenous vein on 11/27/12 by Dr. Trula Slade.  Pt states he no longer feels the generalized bad feeling that he felt 2 days ago. He states there is swelling in the left medial thigh area after getting out of bed, but not in the morning. Redness and pain at left medial thigh are greatly relieved by warm packs. He started taking 500 mg daily levofloxacin on 06/01/13, disp #14,  as prescribed by Dr. Oneida Alar, states he had a life threatening reaction to penicillin many years ago and therefore could not take Keflex.  Pt Diabetic: No Pt smoker: former smoker, quit in 1987  Pt meds include: Statin :Yes Betablocker: Yes ASA: No Other anticoagulants/antiplatelets: Plavix  Past Medical History  Diagnosis Date  . Anxiety   . Arthritis   . Hypertension   . Thyroid disease     hypothyroidism  . Barrett's esophagus   . Diverticulosis   . Colon polyps     tubular adenoma-last colon 12/18/2005  . Hiatal hernia   . Depression   . Hematoma     on mid back  . Hypothyroidism     takes synthroid  . GERD (gastroesophageal reflux disease)     takes omeprazole    Social History History  Substance Use Topics  . Smoking status: Former Smoker    Types: Cigarettes    Quit date: 06/28/1985  . Smokeless tobacco: Never Used  . Alcohol Use: No    Family History Family History  Problem Relation Age of Onset  . Colon cancer Neg Hx   . Heart disease Mother   . Lung cancer Brother     and sister  . Cancer Brother   . Melanoma Father   . Cancer Father   . Alzheimer's disease Sister     x 2  . Heart disease Sister   . Hypertension Sister     Past Surgical  History  Procedure Laterality Date  . Prostate surgery    . Tonsillectomy    . Pyloroplasty    . Polypectomy    . Colonoscopy    . Upper gastrointestinal endoscopy    . Cardiac catheterization    . Cataract surgery      both eyes  . Eye surgery    . Femoral-popliteal bypass graft Left 11/27/2012    Procedure: BYPASS GRAFT LEFT ABOVE KNEE TO BELOW KNEE POPLITEAL ARTERY USING LEFT NONREVERSED GREATER SAPPHENOUS VEIN;  Surgeon: Serafina Mitchell, MD;  Location: [facility];  Service: Vascular;  Laterality: Left;    Allergies  Allergen Reactions  . Penicillins Other (See Comments)    Boils    Current Outpatient Prescriptions  Medication Sig Dispense Refill  . clopidogrel (PLAVIX) 75 MG tablet Take 1 tablet (75 mg total) by mouth daily.  90 tablet  3  . FLUoxetine (PROZAC) 20 MG capsule Take 20 mg by mouth at bedtime.       . gabapentin (NEURONTIN) 300 MG capsule       . levofloxacin (LEVAQUIN) 500 MG tablet Take 1 tablet (500 mg total) by mouth daily.  14 tablet  0  . levothyroxine (SYNTHROID, LEVOTHROID) 125 MCG tablet Take 125 mcg  by mouth daily before breakfast.      . metoprolol succinate (TOPROL-XL) 50 MG 24 hr tablet Take 25 mg by mouth daily. Take with or immediately following a meal.      . Omega-3 Fatty Acids (OMEGA 3 PO) Take 2 capsules by mouth daily.      Marland Kitchen omeprazole (PRILOSEC) 40 MG capsule Take 1 capsule (40 mg total) by mouth daily.  90 capsule  0  . OVER THE COUNTER MEDICATION Place 2 sprays into both nostrils 2 (two) times daily. Over the counter nasal spray.      . Probiotic Product (PROBIOTIC PEARLS ADVANTAGE PO) Take 1 capsule by mouth daily.      Marland Kitchen ALPRAZolam (XANAX) 0.5 MG tablet Take 0.5 mg by mouth at bedtime as needed for anxiety.      Marland Kitchen atorvastatin (LIPITOR) 40 MG tablet       . mupirocin ointment (BACTROBAN) 2 % 1 Application, nasally, 2 times daily for 5 days prior to surgery  22 g  0  . oxyCODONE (OXY IR/ROXICODONE) 5 MG immediate release tablet Take 1-2  tablets (5-10 mg total) by mouth every 6 (six) hours as needed for moderate pain.  30 tablet  0  . Probiotic Product (PROBIOTIC DAILY PO) Take 1 tablet by mouth daily.       No current facility-administered medications for this visit.    ROS: See HPI for pertinent positives and negatives.   Physical Examination   Filed Vitals:   06/03/13 1532  BP: 110/66  Pulse: 62  Temp: 97.9 F (36.6 C)  TempSrc: Oral  Resp: 16  Height: 5\' 9"  (1.753 m)  Weight: 190 lb (86.183 kg)  SpO2: 98%   Body mass index is 28.05 kg/(m^2).  General: A&O x 3, WDWN. Gait: normal Eyes: no gross abnormalites Pulmonary: Respirations are non labored.  Cardiac: regular Rythm   Radial pulses: are 2+ palpable and =.                           VASCULAR EXAM: Extremities without ischemic changes  without Gangrene; without open wounds. Left medial thigh has a red, warm, tender, swollen area measuring about 12 cm x 10 cm, no drainage; looks more swollen and red than 2 days ago.                                                                                                          LE Pulses LEFT RIGHT       FEMORAL  3+ palpable  3+ palpable        POPLITEAL 1+ palpable   2+ palpable       POSTERIOR TIBIAL  not palpable   not palpable        DORSALIS PEDIS      ANTERIOR TIBIAL 2+ palpable  not palpable    Abdomen: soft, NT, no masses. Skin: no rashes, no ulcers noted. Musculoskeletal: no muscle wasting or atrophy.  Neurologic: A&O X 3; Appropriate Affect ; SENSATION: normal; MOTOR FUNCTION:  moving all extremities equally. Speech is fluent/normal. CN 2-12 intact.   ASSESSMENT: Walter [name] is a 80 y.o. male who returns today for check of left upper thigh lymphocele drained by Dr. Kellie Simmering at patient's office visit 2 days ago. He is s/p left femoral popliteal bypass graft using non reversed greater saphenous vein on 11/27/12 by Dr. Trula Slade.  He no longer has the generalized malaise feeling, is  afebrile, but the area of redness and swelling at the left medial thigh healed incision has increased in size, redness, and swelling, is warmer than the surrounding tissue.   PLAN:  Continue levofloxacin and periodic warm packs to left medial thigh. Based on the patient's vascular studies and examination, and after discussing with Dr. Oneida Alar who examined and spoke with patient,  pt will be scheduled for June 4, I&D left leg, general anesthesia, by Dr. Trula Slade.  The patient was given information about PAD including signs, symptoms, treatment, what symptoms should prompt the patient to seek immediate medical care, and risk reduction measures to take.  Clemon Chambers, RN, MSN, FNP-C [facility] Phone: [phone]  Clinic MD: [facility]  06/03/2013 3:44 PM

## 2013-06-03 NOTE — Patient Instructions (Signed)
Peripheral Vascular Disease Peripheral Vascular Disease (PVD), also called Peripheral Arterial Disease (PAD), is a circulation problem caused by cholesterol (atherosclerotic plaque) deposits in the arteries. PVD commonly occurs in the lower extremities (legs) but it can occur in other areas of the body, such as your arms. The cholesterol buildup in the arteries reduces blood flow which can cause pain and other serious problems. The presence of PVD can place a person at risk for Coronary Artery Disease (CAD).  CAUSES  Causes of PVD can be many. It is usually associated with more than one risk factor such as:   High Cholesterol.  Smoking.  Diabetes.  Lack of exercise or inactivity.  High blood pressure (hypertension).  Obesity.  Family history. SYMPTOMS   When the lower extremities are affected, patients with PVD may experience:  Leg pain with exertion or physical activity. This is called INTERMITTENT CLAUDICATION. This may present as cramping or numbness with physical activity. The location of the pain is associated with the level of blockage. For example, blockage at the abdominal level (distal abdominal aorta) may result in buttock or hip pain. Lower leg arterial blockage may result in calf pain.  As PVD becomes more severe, pain can develop with less physical activity.  In people with severe PVD, leg pain may occur at rest.  Other PVD signs and symptoms:  Leg numbness or weakness.  Coldness in the affected leg or foot, especially when compared to the other leg.  A change in leg color.  Patients with significant PVD are more prone to ulcers or sores on toes, feet or legs. These may take longer to heal or may reoccur. The ulcers or sores can become infected.  If signs and symptoms of PVD are ignored, gangrene may occur. This can result in the loss of toes or loss of an entire limb.  Not all leg pain is related to PVD. Other medical conditions can cause leg pain such  as:  Blood clots (embolism) or Deep Vein Thrombosis.  Inflammation of the blood vessels (vasculitis).  Spinal stenosis. DIAGNOSIS  Diagnosis of PVD can involve several different types of tests. These can include:  Pulse Volume Recording Method (PVR). This test is simple, painless and does not involve the use of X-rays. PVR involves measuring and comparing the blood pressure in the arms and legs. An ABI (Ankle-Brachial Index) is calculated. The normal ratio of blood pressures is 1. As this number becomes smaller, it indicates more severe disease.  < 0.95  indicates significant narrowing in one or more leg vessels.  <0.8 there will usually be pain in the foot, leg or buttock with exercise.  <0.4 will usually have pain in the legs at rest.  <0.25  usually indicates limb threatening PVD.  Doppler detection of pulses in the legs. This test is painless and checks to see if you have a pulses in your legs/feet.  A dye or contrast material (a substance that highlights the blood vessels so they show up on x-ray) may be given to help your caregiver better see the arteries for the following tests. The dye is eliminated from your body by the kidney's. Your caregiver may order blood work to check your kidney function and other laboratory values before the following tests are performed:  Magnetic Resonance Angiography (MRA). An MRA is a picture study of the blood vessels and arteries. The MRA machine uses a large magnet to produce images of the blood vessels.  Computed Tomography Angiography (CTA). A CTA is a   specialized x-ray that looks at how the blood flows in your blood vessels. An IV may be inserted into your arm so contrast dye can be injected.  Angiogram. Is a procedure that uses x-rays to look at your blood vessels. This procedure is minimally invasive, meaning a small incision (cut) is made in your groin. A small tube (catheter) is then inserted into the artery of your groin. The catheter is  guided to the blood vessel or artery your caregiver wants to examine. Contrast dye is injected into the catheter. X-rays are then taken of the blood vessel or artery. After the images are obtained, the catheter is taken out. TREATMENT  Treatment of PVD involves many interventions which may include:  Lifestyle changes:  Quitting smoking.  Exercise.  Following a low fat, low cholesterol diet.  Control of diabetes.  Foot care is very important to the PVD patient. Good foot care can help prevent infection.  Medication:  Cholesterol-lowering medicine.  Blood pressure medicine.  Anti-platelet drugs.  Certain medicines may reduce symptoms of Intermittent Claudication.  Interventional/Surgical options:  Angioplasty. An Angioplasty is a procedure that inflates a balloon in the blocked artery. This opens the blocked artery to improve blood flow.  Stent Implant. A wire mesh tube (stent) is placed in the artery. The stent expands and stays in place, allowing the artery to remain open.  Peripheral Bypass Surgery. This is a surgical procedure that reroutes the blood around a blocked artery to help improve blood flow. This type of procedure may be performed if Angioplasty or stent implants are not an option. SEEK IMMEDIATE MEDICAL CARE IF:   You develop pain or numbness in your arms or legs.  Your arm or leg turns cold, becomes blue in color.  You develop redness, warmth, swelling and pain in your arms or legs. MAKE SURE YOU:   Understand these instructions.  Will watch your condition.  Will get help right away if you are not doing well or get worse. Document Released: 02/01/2004 Document Revised: 03/18/2011 Document Reviewed: 12/29/2007 ExitCare Patient Information 2014 [employer].  

## 2013-06-04 NOTE — Pre-Procedure Instructions (Addendum)
Walter [name]  06/04/2013   Your procedure is scheduled on:  06/10/13  Report to [facility] short stay admitting at 21 AM.  Call this number if you have problems the morning of surgery: [phone]   Remember:   Do not eat food or drink liquids after midnight.   Take these medicines the morning of surgery with A SIP OF WATER: ,pain med if needed,          Take all meds as ordered until day of surgery except as instructed below or per dr       Walter [name] all herbel meds, nsaids (aleve,naproxen,advil,ibuprofen) now including vitamins ,fish oil, probiotic      Plavix per dr   Walter [name] not wear jewelry, make-up or nail polish.  Do not wear lotions, powders, or perfumes. You may wear deodorant.  Do not shave 48 hours prior to surgery. Men may shave face and neck.  Do not bring valuables to the hospital.  [facility] is not responsible                  for any belongings or valuables.               Contacts, dentures or bridgework may not be worn into surgery.  Leave suitcase in the car. After surgery it may be brought to your room.  For patients admitted to the hospital, discharge time is determined by your                treatment team.               Patients discharged the day of surgery will not be allowed to drive  home.  Name and phone number of your driver:   Special Instructions:  Special Instructions:  - Preparing for Surgery  Before surgery, you can play an important role.  Because skin is not sterile, your skin needs to be as free of germs as possible.  You can reduce the number of germs on you skin by washing with CHG (chlorahexidine gluconate) soap before surgery.  CHG is an antiseptic cleaner which kills germs and bonds with the skin to continue killing germs even after washing.  Please DO NOT use if you have an allergy to CHG or antibacterial soaps.  If your skin becomes reddened/irritated stop using the CHG and inform your nurse when you arrive at Short Stay.  Do not shave  (including legs and underarms) for at least 48 hours prior to the first CHG shower.  You may shave your face.  Please follow these instructions carefully:   1.  Shower with CHG Soap the night before surgery and the morning of Surgery.  2.  If you choose to wash your hair, wash your hair first as usual with your normal shampoo.  3.  After you shampoo, rinse your hair and body thoroughly to remove the Shampoo.  4.  Use CHG as you would any other liquid soap.  You can apply chg directly  to the skin and wash gently with scrungie or a clean washcloth.  5.  Apply the CHG Soap to your body ONLY FROM THE NECK DOWN.  Do not use on open wounds or open sores.  Avoid contact with your eyes ears, mouth and genitals (private parts).  Wash genitals (private parts)       with your normal soap.  6.  Wash thoroughly, paying special attention to the area where your surgery will be performed.  7.  Thoroughly rinse your body with warm water from the neck down.  8.  DO NOT shower/wash with your normal soap after using and rinsing off the CHG Soap.  9.  Pat yourself dry with a clean towel.            10.  Wear clean pajamas.            11.  Place clean sheets on your bed the night of your first shower and do not sleep with pets.  Day of Surgery  Do not apply any lotions/deodorants the morning of surgery.  Please wear clean clothes to the hospital/surgery center.   Please read over the following fact sheets that you were given: Pain Booklet, Coughing and Deep Breathing, MRSA Information and Surgical Site Infection Prevention

## 2013-06-05 ENCOUNTER — Encounter: Payer: Self-pay | Admitting: Internal Medicine

## 2013-06-05 LAB — BODY FLUID CULTURE
Gram Stain: NONE SEEN
Gram Stain: NONE SEEN

## 2013-06-07 ENCOUNTER — Encounter (HOSPITAL_COMMUNITY)
Admission: RE | Admit: 2013-06-07 | Discharge: 2013-06-07 | Disposition: A | Discharge status: Home / Self Care | Payer: Medicare Other | Source: Ambulatory Visit | Attending: Surgery | Admitting: Surgery

## 2013-06-07 ENCOUNTER — Encounter (HOSPITAL_COMMUNITY): Payer: Self-pay

## 2013-06-07 DIAGNOSIS — M79609 Pain in unspecified limb: Secondary | ICD-10-CM | POA: Diagnosis not present

## 2013-06-07 DIAGNOSIS — F411 Generalized anxiety disorder: Secondary | ICD-10-CM | POA: Diagnosis present

## 2013-06-07 DIAGNOSIS — F329 Major depressive disorder, single episode, unspecified: Secondary | ICD-10-CM | POA: Diagnosis present

## 2013-06-07 DIAGNOSIS — Z87891 Personal history of nicotine dependence: Secondary | ICD-10-CM | POA: Diagnosis not present

## 2013-06-07 DIAGNOSIS — D126 Benign neoplasm of colon, unspecified: Secondary | ICD-10-CM | POA: Diagnosis not present

## 2013-06-07 DIAGNOSIS — Z79899 Other long term (current) drug therapy: Secondary | ICD-10-CM | POA: Diagnosis not present

## 2013-06-07 DIAGNOSIS — T8189XA Other complications of procedures, not elsewhere classified, initial encounter: Secondary | ICD-10-CM | POA: Diagnosis not present

## 2013-06-07 DIAGNOSIS — L98499 Non-pressure chronic ulcer of skin of other sites with unspecified severity: Secondary | ICD-10-CM | POA: Diagnosis not present

## 2013-06-07 DIAGNOSIS — K2270 Barrett's esophagus without dysplasia: Secondary | ICD-10-CM | POA: Diagnosis not present

## 2013-06-07 DIAGNOSIS — Z880 Allergy status to penicillin: Secondary | ICD-10-CM | POA: Diagnosis not present

## 2013-06-07 DIAGNOSIS — E039 Hypothyroidism, unspecified: Secondary | ICD-10-CM | POA: Diagnosis not present

## 2013-06-07 DIAGNOSIS — I898 Other specified noninfective disorders of lymphatic vessels and lymph nodes: Secondary | ICD-10-CM | POA: Diagnosis not present

## 2013-06-07 DIAGNOSIS — I10 Essential (primary) hypertension: Secondary | ICD-10-CM | POA: Diagnosis not present

## 2013-06-07 DIAGNOSIS — I739 Peripheral vascular disease, unspecified: Secondary | ICD-10-CM | POA: Diagnosis present

## 2013-06-07 DIAGNOSIS — Z01812 Encounter for preprocedural laboratory examination: Secondary | ICD-10-CM | POA: Diagnosis not present

## 2013-06-07 DIAGNOSIS — F3289 Other specified depressive episodes: Secondary | ICD-10-CM | POA: Diagnosis present

## 2013-06-07 DIAGNOSIS — Z7902 Long term (current) use of antithrombotics/antiplatelets: Secondary | ICD-10-CM | POA: Diagnosis not present

## 2013-06-07 DIAGNOSIS — IMO0002 Reserved for concepts with insufficient information to code with codable children: Secondary | ICD-10-CM | POA: Diagnosis not present

## 2013-06-07 DIAGNOSIS — K219 Gastro-esophageal reflux disease without esophagitis: Secondary | ICD-10-CM | POA: Diagnosis present

## 2013-06-07 HISTORY — DX: Peripheral vascular disease, unspecified: I73.9

## 2013-06-07 LAB — SURGICAL PCR SCREEN
MRSA, PCR: NEGATIVE
Staphylococcus aureus: NEGATIVE

## 2013-06-07 NOTE — Progress Notes (Signed)
06/07/13 0913  OBSTRUCTIVE SLEEP APNEA  Have you ever been diagnosed with sleep apnea through a sleep study? No  Do you snore loudly (loud enough to be heard through closed doors)?  0  Do you often feel tired, fatigued, or sleepy during the daytime? 0  Has anyone observed you stop breathing during your sleep? 1  Do you have, or are you being treated for high blood pressure? 1  BMI more than 35 kg/m2? 0  Age over 78 years old? 1  Neck circumference greater than 40 cm/16 inches? 1 (16)  Gender: 1  Obstructive Sleep Apnea Score 5  Score 4 or greater  Results sent to PCP

## 2013-06-07 NOTE — Progress Notes (Signed)
Instructed to continue plavix except for day of surgery by dr fields per Zigmund Daniel at office

## 2013-06-09 DIAGNOSIS — T8189XA Other complications of procedures, not elsewhere classified, initial encounter: Secondary | ICD-10-CM | POA: Diagnosis not present

## 2013-06-09 MED ORDER — CHLORHEXIDINE GLUCONATE CLOTH 2 % EX PADS: 6.0000 | MEDICATED_PAD | Freq: Once | CUTANEOUS | Status: DC

## 2013-06-09 MED ORDER — VANCOMYCIN HCL IN DEXTROSE 1-5 GM/200ML-% IV SOLN
1000.0000 mg | INTRAVENOUS | Status: AC
  Administered 2013-06-10: 1000 mg via INTRAVENOUS
  Filled 2013-06-09 (×2): qty 200

## 2013-06-09 MED ORDER — SODIUM CHLORIDE 0.9 % IV SOLN: INTRAVENOUS | Status: DC

## 2013-06-10 ENCOUNTER — Encounter (HOSPITAL_COMMUNITY): Payer: Medicare Other | Admitting: Vascular Surgery

## 2013-06-10 ENCOUNTER — Encounter (HOSPITAL_COMMUNITY)
Admission: RE | Disposition: A | Discharge status: Home Health | Payer: Self-pay | Source: Ambulatory Visit | Attending: Surgery

## 2013-06-10 ENCOUNTER — Inpatient Hospital Stay (HOSPITAL_COMMUNITY)
Admission: RE | Admit: 2013-06-10 | Discharge: 2013-06-11 | DRG: 858 | Disposition: A | Discharge status: Home Health | Payer: Medicare Other | Source: Ambulatory Visit | Attending: Surgery | Admitting: Surgery

## 2013-06-10 ENCOUNTER — Inpatient Hospital Stay (HOSPITAL_COMMUNITY): Payer: Medicare Other | Admitting: Anesthesiology

## 2013-06-10 ENCOUNTER — Encounter (HOSPITAL_COMMUNITY): Payer: Self-pay | Admitting: Surgery

## 2013-06-10 DIAGNOSIS — D126 Benign neoplasm of colon, unspecified: Secondary | ICD-10-CM | POA: Diagnosis not present

## 2013-06-10 DIAGNOSIS — K2270 Barrett's esophagus without dysplasia: Secondary | ICD-10-CM | POA: Diagnosis present

## 2013-06-10 DIAGNOSIS — I10 Essential (primary) hypertension: Secondary | ICD-10-CM | POA: Diagnosis not present

## 2013-06-10 DIAGNOSIS — I739 Peripheral vascular disease, unspecified: Secondary | ICD-10-CM | POA: Diagnosis not present

## 2013-06-10 DIAGNOSIS — E039 Hypothyroidism, unspecified: Secondary | ICD-10-CM | POA: Diagnosis not present

## 2013-06-10 DIAGNOSIS — Z880 Allergy status to penicillin: Secondary | ICD-10-CM

## 2013-06-10 DIAGNOSIS — Z87891 Personal history of nicotine dependence: Secondary | ICD-10-CM

## 2013-06-10 DIAGNOSIS — F3289 Other specified depressive episodes: Secondary | ICD-10-CM | POA: Diagnosis present

## 2013-06-10 DIAGNOSIS — Y849 Medical procedure, unspecified as the cause of abnormal reaction of the patient, or of later complication, without mention of misadventure at the time of the procedure: Secondary | ICD-10-CM | POA: Diagnosis present

## 2013-06-10 DIAGNOSIS — IMO0002 Reserved for concepts with insufficient information to code with codable children: Principal | ICD-10-CM | POA: Diagnosis present

## 2013-06-10 DIAGNOSIS — F411 Generalized anxiety disorder: Secondary | ICD-10-CM | POA: Diagnosis present

## 2013-06-10 DIAGNOSIS — L98499 Non-pressure chronic ulcer of skin of other sites with unspecified severity: Secondary | ICD-10-CM | POA: Diagnosis present

## 2013-06-10 DIAGNOSIS — F329 Major depressive disorder, single episode, unspecified: Secondary | ICD-10-CM | POA: Diagnosis present

## 2013-06-10 DIAGNOSIS — Z01812 Encounter for preprocedural laboratory examination: Secondary | ICD-10-CM

## 2013-06-10 DIAGNOSIS — Z79899 Other long term (current) drug therapy: Secondary | ICD-10-CM

## 2013-06-10 DIAGNOSIS — Z7902 Long term (current) use of antithrombotics/antiplatelets: Secondary | ICD-10-CM

## 2013-06-10 DIAGNOSIS — I898 Other specified noninfective disorders of lymphatic vessels and lymph nodes: Secondary | ICD-10-CM | POA: Diagnosis not present

## 2013-06-10 DIAGNOSIS — K219 Gastro-esophageal reflux disease without esophagitis: Secondary | ICD-10-CM | POA: Diagnosis present

## 2013-06-10 HISTORY — PX: I & D EXTREMITY: SHX5045

## 2013-06-10 HISTORY — DX: Pure hypercholesterolemia, unspecified: E78.00

## 2013-06-10 LAB — CBC
HEMATOCRIT: 31.8 % — AB (ref 39.0–52.0)
Hemoglobin: 11 g/dL — ABNORMAL LOW (ref 13.0–17.0)
MCH: 32.1 pg (ref 26.0–34.0)
MCHC: 34.6 g/dL (ref 30.0–36.0)
MCV: 92.7 fL (ref 78.0–100.0)
Platelets: 256 10*3/uL (ref 150–400)
RBC: 3.43 MIL/uL — ABNORMAL LOW (ref 4.22–5.81)
RDW: 13.4 % (ref 11.5–15.5)
WBC: 8.9 10*3/uL (ref 4.0–10.5)

## 2013-06-10 LAB — BASIC METABOLIC PANEL
BUN: 29 mg/dL — AB (ref 6–23)
CHLORIDE: 101 meq/L (ref 96–112)
CO2: 22 mEq/L (ref 19–32)
Calcium: 9.6 mg/dL (ref 8.4–10.5)
Creatinine, Ser: 1.37 mg/dL — ABNORMAL HIGH (ref 0.50–1.35)
GFR calc Af Amer: 56 mL/min — ABNORMAL LOW (ref >90)
GFR, EST NON AFRICAN AMERICAN: 48 mL/min — AB (ref >90)
GLUCOSE: 94 mg/dL (ref 70–99)
Potassium: 4.9 mEq/L (ref 3.7–5.3)
Sodium: 138 mEq/L (ref 137–147)

## 2013-06-10 SURGERY — IRRIGATION AND DEBRIDEMENT EXTREMITY
Anesthesia: General | Site: Leg Upper | Laterality: Left

## 2013-06-10 MED ORDER — PHENOL 1.4 % MT LIQD
1.0000 | OROMUCOSAL | Status: DC | PRN
  Filled 2013-06-10: qty 177

## 2013-06-10 MED ORDER — MEPERIDINE HCL 25 MG/ML IJ SOLN: 6.2500 mg | INTRAMUSCULAR | Status: DC | PRN

## 2013-06-10 MED ORDER — LEVOTHYROXINE SODIUM 125 MCG PO TABS
125.0000 ug | ORAL_TABLET | Freq: Every day | ORAL | Status: DC
  Administered 2013-06-10: 125 ug via ORAL
  Filled 2013-06-10 (×2): qty 1

## 2013-06-10 MED ORDER — LACTATED RINGERS IV SOLN
INTRAVENOUS | Status: DC | PRN
  Administered 2013-06-10 (×2): via INTRAVENOUS

## 2013-06-10 MED ORDER — FENTANYL CITRATE 0.05 MG/ML IJ SOLN
INTRAMUSCULAR | Status: DC | PRN
  Administered 2013-06-10: 25 ug via INTRAVENOUS
  Administered 2013-06-10: 100 ug via INTRAVENOUS
  Administered 2013-06-10: 50 ug via INTRAVENOUS

## 2013-06-10 MED ORDER — FLUOXETINE HCL 20 MG PO CAPS
20.0000 mg | ORAL_CAPSULE | Freq: Every day | ORAL | Status: DC
  Administered 2013-06-10: 20 mg via ORAL
  Filled 2013-06-10 (×2): qty 1

## 2013-06-10 MED ORDER — ENOXAPARIN SODIUM 30 MG/0.3ML ~~LOC~~ SOLN
30.0000 mg | SUBCUTANEOUS | Status: DC
  Filled 2013-06-10 (×2): qty 0.3

## 2013-06-10 MED ORDER — SODIUM CHLORIDE 0.9 % IJ SOLN
INTRAMUSCULAR | Status: AC
  Filled 2013-06-10: qty 10

## 2013-06-10 MED ORDER — CLOPIDOGREL BISULFATE 75 MG PO TABS
75.0000 mg | ORAL_TABLET | Freq: Every day | ORAL | Status: DC
  Administered 2013-06-11: 75 mg via ORAL
  Filled 2013-06-10: qty 1

## 2013-06-10 MED ORDER — GUAIFENESIN-DM 100-10 MG/5ML PO SYRP: 15.0000 mL | ORAL_SOLUTION | ORAL | Status: DC | PRN

## 2013-06-10 MED ORDER — PROPOFOL 10 MG/ML IV BOLUS
INTRAVENOUS | Status: AC
  Filled 2013-06-10: qty 20

## 2013-06-10 MED ORDER — ONDANSETRON HCL 4 MG/2ML IJ SOLN: 4.0000 mg | Freq: Once | INTRAMUSCULAR | Status: DC | PRN

## 2013-06-10 MED ORDER — MORPHINE SULFATE 2 MG/ML IJ SOLN: 2.0000 mg | INTRAMUSCULAR | Status: DC | PRN

## 2013-06-10 MED ORDER — OXYCODONE HCL 5 MG PO TABS
ORAL_TABLET | ORAL | Status: AC
  Filled 2013-06-10: qty 2

## 2013-06-10 MED ORDER — DOCUSATE SODIUM 100 MG PO CAPS
100.0000 mg | ORAL_CAPSULE | Freq: Two times a day (BID) | ORAL | Status: DC
  Administered 2013-06-11: 100 mg via ORAL
  Filled 2013-06-10 (×3): qty 1

## 2013-06-10 MED ORDER — METOPROLOL TARTRATE 1 MG/ML IV SOLN: 2.0000 mg | INTRAVENOUS | Status: DC | PRN

## 2013-06-10 MED ORDER — GABAPENTIN 300 MG PO CAPS
300.0000 mg | ORAL_CAPSULE | Freq: Every day | ORAL | Status: DC
  Administered 2013-06-10: 300 mg via ORAL
  Filled 2013-06-10 (×2): qty 1

## 2013-06-10 MED ORDER — PROPOFOL 10 MG/ML IV BOLUS
INTRAVENOUS | Status: DC | PRN
  Administered 2013-06-10: 150 mg via INTRAVENOUS

## 2013-06-10 MED ORDER — ONDANSETRON HCL 4 MG/2ML IJ SOLN
INTRAMUSCULAR | Status: AC
  Filled 2013-06-10: qty 2

## 2013-06-10 MED ORDER — ALUM & MAG HYDROXIDE-SIMETH 200-200-20 MG/5ML PO SUSP: 15.0000 mL | ORAL | Status: DC | PRN

## 2013-06-10 MED ORDER — HYDROMORPHONE HCL PF 1 MG/ML IJ SOLN
INTRAMUSCULAR | Status: AC
  Filled 2013-06-10: qty 1

## 2013-06-10 MED ORDER — SODIUM CHLORIDE 0.9 % IV SOLN: 250.0000 mL | INTRAVENOUS | Status: DC | PRN

## 2013-06-10 MED ORDER — OXYCODONE HCL 5 MG PO TABS
5.0000 mg | ORAL_TABLET | ORAL | Status: DC | PRN
  Administered 2013-06-10 (×2): 10 mg via ORAL
  Filled 2013-06-10: qty 2

## 2013-06-10 MED ORDER — SODIUM CHLORIDE 0.9 % IJ SOLN
3.0000 mL | Freq: Two times a day (BID) | INTRAMUSCULAR | Status: DC
  Administered 2013-06-11: 3 mL via INTRAVENOUS

## 2013-06-10 MED ORDER — ACETAMINOPHEN 325 MG PO TABS: 650.0000 mg | ORAL_TABLET | Freq: Four times a day (QID) | ORAL | Status: DC | PRN

## 2013-06-10 MED ORDER — HYDRALAZINE HCL 20 MG/ML IJ SOLN: 10.0000 mg | INTRAMUSCULAR | Status: DC | PRN

## 2013-06-10 MED ORDER — 0.9 % SODIUM CHLORIDE (POUR BTL) OPTIME
TOPICAL | Status: DC | PRN
  Administered 2013-06-10: 1000 mL

## 2013-06-10 MED ORDER — LEVOFLOXACIN IN D5W 500 MG/100ML IV SOLN
500.0000 mg | INTRAVENOUS | Status: DC
  Administered 2013-06-10: 500 mg via INTRAVENOUS
  Filled 2013-06-10 (×2): qty 100

## 2013-06-10 MED ORDER — ONDANSETRON HCL 4 MG/2ML IJ SOLN: 4.0000 mg | Freq: Four times a day (QID) | INTRAMUSCULAR | Status: DC | PRN

## 2013-06-10 MED ORDER — OXYCODONE HCL 5 MG PO TABS: 5.0000 mg | ORAL_TABLET | Freq: Once | ORAL | Status: DC | PRN

## 2013-06-10 MED ORDER — OXYCODONE HCL 5 MG/5ML PO SOLN: 5.0000 mg | Freq: Once | ORAL | Status: DC | PRN

## 2013-06-10 MED ORDER — LIDOCAINE HCL (CARDIAC) 20 MG/ML IV SOLN
INTRAVENOUS | Status: DC | PRN
  Administered 2013-06-10: 100 mg via INTRAVENOUS

## 2013-06-10 MED ORDER — FENTANYL CITRATE 0.05 MG/ML IJ SOLN
INTRAMUSCULAR | Status: AC
  Filled 2013-06-10: qty 5

## 2013-06-10 MED ORDER — ATORVASTATIN CALCIUM 40 MG PO TABS
40.0000 mg | ORAL_TABLET | Freq: Every day | ORAL | Status: DC
  Administered 2013-06-10 – 2013-06-11 (×2): 40 mg via ORAL
  Filled 2013-06-10 (×2): qty 1

## 2013-06-10 MED ORDER — EPHEDRINE SULFATE 50 MG/ML IJ SOLN
INTRAMUSCULAR | Status: AC
  Filled 2013-06-10: qty 1

## 2013-06-10 MED ORDER — GLYCOPYRROLATE 0.2 MG/ML IJ SOLN
INTRAMUSCULAR | Status: DC | PRN
  Administered 2013-06-10: 0.2 mg via INTRAVENOUS

## 2013-06-10 MED ORDER — EPHEDRINE SULFATE 50 MG/ML IJ SOLN
INTRAMUSCULAR | Status: DC | PRN
  Administered 2013-06-10 (×2): 5 mg via INTRAVENOUS
  Administered 2013-06-10: 10 mg via INTRAVENOUS

## 2013-06-10 MED ORDER — MIDAZOLAM HCL 2 MG/2ML IJ SOLN
INTRAMUSCULAR | Status: AC
  Filled 2013-06-10: qty 2

## 2013-06-10 MED ORDER — HYDROMORPHONE HCL PF 1 MG/ML IJ SOLN
0.2500 mg | INTRAMUSCULAR | Status: DC | PRN
  Administered 2013-06-10: 0.5 mg via INTRAVENOUS

## 2013-06-10 MED ORDER — PANTOPRAZOLE SODIUM 40 MG PO TBEC
40.0000 mg | DELAYED_RELEASE_TABLET | Freq: Every day | ORAL | Status: DC
Start: 2013-06-10 — End: 2013-06-11
  Administered 2013-06-10 – 2013-06-11 (×2): 40 mg via ORAL
  Filled 2013-06-10 (×2): qty 1

## 2013-06-10 MED ORDER — METOPROLOL SUCCINATE ER 25 MG PO TB24
25.0000 mg | ORAL_TABLET | Freq: Every day | ORAL | Status: DC
  Filled 2013-06-10 (×2): qty 1

## 2013-06-10 MED ORDER — LIDOCAINE HCL (CARDIAC) 20 MG/ML IV SOLN
INTRAVENOUS | Status: AC
  Filled 2013-06-10: qty 5

## 2013-06-10 MED ORDER — LABETALOL HCL 5 MG/ML IV SOLN
10.0000 mg | INTRAVENOUS | Status: DC | PRN
  Filled 2013-06-10: qty 4

## 2013-06-10 MED ORDER — SODIUM CHLORIDE 0.9 % IJ SOLN: 3.0000 mL | INTRAMUSCULAR | Status: DC | PRN

## 2013-06-10 MED ORDER — LACTATED RINGERS IV SOLN
INTRAVENOUS | Status: DC
  Administered 2013-06-10: 10:00:00 via INTRAVENOUS

## 2013-06-10 SURGICAL SUPPLY — 49 items
BAG ISL DRAPE 18X18 STRL (DRAPES) ×1
BAG ISOLATION DRAPE 18X18 (DRAPES) IMPLANT
BANDAGE ELASTIC 4 VELCRO ST LF (GAUZE/BANDAGES/DRESSINGS) IMPLANT
BANDAGE ELASTIC 6 VELCRO ST LF (GAUZE/BANDAGES/DRESSINGS) IMPLANT
BANDAGE GAUZE ELAST BULKY 4 IN (GAUZE/BANDAGES/DRESSINGS) IMPLANT
BNDG GAUZE ELAST 4 BULKY (GAUZE/BANDAGES/DRESSINGS) ×4 IMPLANT
CANISTER SUCTION 2500CC (MISCELLANEOUS) ×3 IMPLANT
CLIP TI MEDIUM 6 (CLIP) ×1 IMPLANT
CLIP TI WIDE RED SMALL 6 (CLIP) ×1 IMPLANT
COVER SURGICAL LIGHT HANDLE (MISCELLANEOUS) ×3 IMPLANT
DRAPE EXTREMITY BILATERAL (DRAPE) IMPLANT
DRAPE EXTREMITY T 121X128X90 (DRAPE) IMPLANT
DRAPE INCISE IOBAN 66X45 STRL (DRAPES) ×2 IMPLANT
DRAPE ISOLATION BAG 18X18 (DRAPES) ×2
DRAPE U-SHAPE 76X120 STRL (DRAPES) IMPLANT
ELECT REM PT RETURN 9FT ADLT (ELECTROSURGICAL) ×3
ELECTRODE REM PT RTRN 9FT ADLT (ELECTROSURGICAL) ×1 IMPLANT
GAUZE XEROFORM 5X9 LF (GAUZE/BANDAGES/DRESSINGS) IMPLANT
GLOVE BIO SURGEON STRL SZ 6.5 (GLOVE) ×2 IMPLANT
GLOVE BIO SURGEONS STRL SZ 6.5 (GLOVE) ×2
GLOVE BIOGEL PI IND STRL 6.5 (GLOVE) IMPLANT
GLOVE BIOGEL PI IND STRL 7.5 (GLOVE) ×1 IMPLANT
GLOVE BIOGEL PI INDICATOR 6.5 (GLOVE) ×2
GLOVE BIOGEL PI INDICATOR 7.5 (GLOVE) ×2
GLOVE ECLIPSE 6.5 STRL STRAW (GLOVE) ×2 IMPLANT
GLOVE SURG SS PI 7.5 STRL IVOR (GLOVE) ×3 IMPLANT
GOWN STRL REUS W/ TWL LRG LVL3 (GOWN DISPOSABLE) ×2 IMPLANT
GOWN STRL REUS W/ TWL XL LVL3 (GOWN DISPOSABLE) ×1 IMPLANT
GOWN STRL REUS W/TWL LRG LVL3 (GOWN DISPOSABLE) ×6
GOWN STRL REUS W/TWL XL LVL3 (GOWN DISPOSABLE) ×3
KIT BASIN OR (CUSTOM PROCEDURE TRAY) ×3 IMPLANT
KIT ROOM TURNOVER OR (KITS) ×3 IMPLANT
NS IRRIG 1000ML POUR BTL (IV SOLUTION) ×3 IMPLANT
PACK CV ACCESS (CUSTOM PROCEDURE TRAY) ×2 IMPLANT
PACK GENERAL/GYN (CUSTOM PROCEDURE TRAY) IMPLANT
PACK UNIVERSAL I (CUSTOM PROCEDURE TRAY) IMPLANT
PAD ARMBOARD 7.5X6 YLW CONV (MISCELLANEOUS) ×6 IMPLANT
SPONGE GAUZE 4X4 12PLY (GAUZE/BANDAGES/DRESSINGS) ×3 IMPLANT
SPONGE GAUZE 4X4 12PLY STER LF (GAUZE/BANDAGES/DRESSINGS) ×2 IMPLANT
SUT ETHILON 3 0 PS 1 (SUTURE) IMPLANT
SUT VIC AB 2-0 CTX 36 (SUTURE) IMPLANT
SUT VIC AB 3-0 SH 27 (SUTURE)
SUT VIC AB 3-0 SH 27X BRD (SUTURE) IMPLANT
SUT VICRYL 4-0 PS2 18IN ABS (SUTURE) IMPLANT
SWAB COLLECTION DEVICE MRSA (MISCELLANEOUS) ×2 IMPLANT
TOWEL OR 17X24 6PK STRL BLUE (TOWEL DISPOSABLE) ×3 IMPLANT
TOWEL OR 17X26 10 PK STRL BLUE (TOWEL DISPOSABLE) ×3 IMPLANT
TUBE ANAEROBIC SPECIMEN COL (MISCELLANEOUS) ×2 IMPLANT
WATER STERILE IRR 1000ML POUR (IV SOLUTION) ×3 IMPLANT

## 2013-06-10 NOTE — Interval H&P Note (Signed)
History and Physical Interval Note:  06/10/2013 4:33 PM  Walter [name]  has presented today for surgery, with the diagnosis of Other noninfectious disorders of lymphatic channels   The various methods of treatment have been discussed with the patient and family. After consideration of risks, benefits and other options for treatment, the patient has consented to  Procedure(s): IRRIGATION AND DEBRIDEMENT EXTREMITY (Left) as a surgical intervention .  The patient's history has been reviewed, patient examined, no change in status, stable for surgery.  I have reviewed the patient's chart and labs.  Questions were answered to the patient's satisfaction.     Serafina Mitchell

## 2013-06-10 NOTE — Op Note (Signed)
    Patient name: Walter [name] MRN: [id number] DOB: 04-06-1933 Sex: male  06/10/2013 Pre-operative Diagnosis: Infected left leg seroma Post-operative diagnosis:  Same Surgeon:  Serafina Mitchell Assistants:  Leontine Locket Procedure:   I&D left leg seroma Anesthesia:  Gen. Blood Loss:  See anesthesia record Specimens:  Anaerobic and aerobic cultures were sent  Findings:  Small cavity seroma  Indications:  The patient has failed outpatient management of a infected seroma.  He comes in today for operative drainage  Procedure:  The patient was identified in the holding area and taken to [facility]  The patient was then placed supine on the table. general anesthesia was administered.  The patient was prepped and draped in the usual sterile fashion.  A time out was called and antibiotics were administered.  The patient's previous above-knee medial incision was opened approximately halfway with a #10 blade.  Knife was used to enter into the seroma cavity.  Cultures were sent both anaerobic and aerobic.  The cavity was fully evacuated.  I cauterized the friable tissue.  It was then packed with Betadine soaked gauze.  Sterile dressings were applied the   Disposition:  To PACU in stable condition.   Theotis Burrow, M.D. [facility] [place] Office: [phone] Pager:  [phone]

## 2013-06-10 NOTE — Anesthesia Preprocedure Evaluation (Addendum)
Anesthesia Evaluation  Patient identified by MRN, date of birth, ID band Patient awake    Reviewed: Allergy & Precautions, H&P , NPO status , Patient's Chart, lab work & pertinent test results  Airway Mallampati: I TM Distance: >3 FB Neck ROM: Full    Dental  (+) Teeth Intact, Dental Advisory Given   Pulmonary former smoker,          Cardiovascular hypertension, Pt. on medications + Peripheral Vascular Disease     Neuro/Psych    GI/Hepatic   Endo/Other  Hypothyroidism   Renal/GU      Musculoskeletal   Abdominal   Peds  Hematology   Anesthesia Other Findings   Reproductive/Obstetrics                          Anesthesia Physical Anesthesia Plan  ASA: III  Anesthesia Plan: General   Post-op Pain Management:    Induction: Intravenous  Airway Management Planned: LMA  Additional Equipment:   Intra-op Plan:   Post-operative Plan: Extubation in OR  Informed Consent: I have reviewed the patients History and Physical, chart, labs and discussed the procedure including the risks, benefits and alternatives for the proposed anesthesia with the patient or authorized representative who has indicated his/her understanding and acceptance.     Plan Discussed with: CRNA and Surgeon  Anesthesia Plan Comments:         Anesthesia Quick Evaluation

## 2013-06-10 NOTE — Transfer of Care (Signed)
Immediate Anesthesia Transfer of Care Note  Patient: Walter [name]  Procedure(s) Performed: Procedure(s): IRRIGATION AND DEBRIDEMENT OF LEFT UPPER LEG (Left)  Patient Location: PACU  Anesthesia Type:General  Level of Consciousness: awake, alert  and oriented  Airway & Oxygen Therapy: Patient Spontanous Breathing and Patient connected to nasal cannula oxygen  Post-op Assessment: Report given to PACU RN, Post -op Vital signs reviewed and stable and Patient moving all extremities X 4  Post vital signs: Reviewed and stable  Complications: No apparent anesthesia complications

## 2013-06-10 NOTE — H&P (View-Only) (Signed)
[facility] [place] HISTORY AND PHYSICAL -PAD  History of Present Illness Walter [name] is a 79 y.o. male patient of Dr. Lawson who returns today for check of left upper thigh lymphocele drained by Dr. Lawson at patient's office visit 2 days ago. He is s/p left femoral popliteal bypass graft using non reversed greater saphenous vein on 11/27/12 by Dr. Brabham.  Pt states he no longer feels the generalized bad feeling that he felt 2 days ago. He states there is swelling in the left medial thigh area after getting out of bed, but not in the morning. Redness and pain at left medial thigh are greatly relieved by warm packs. He started taking 500 mg daily levofloxacin on 06/01/13, disp #14,  as prescribed by Dr. Fields, states he had a life threatening reaction to penicillin many years ago and therefore could not take Keflex.  Pt Diabetic: No Pt smoker: former smoker, quit in 1987  Pt meds include: Statin :Yes Betablocker: Yes ASA: No Other anticoagulants/antiplatelets: Plavix  Past Medical History  Diagnosis Date  . Anxiety   . Arthritis   . Hypertension   . Thyroid disease     hypothyroidism  . Barrett's esophagus   . Diverticulosis   . Colon polyps     tubular adenoma-last colon 12/18/2005  . Hiatal hernia   . Depression   . Hematoma     on mid back  . Hypothyroidism     takes synthroid  . GERD (gastroesophageal reflux disease)     takes omeprazole    Social History History  Substance Use Topics  . Smoking status: Former Smoker    Types: Cigarettes    Quit date: 06/28/1985  . Smokeless tobacco: Never Used  . Alcohol Use: No    Family History Family History  Problem Relation Age of Onset  . Colon cancer Neg Hx   . Heart disease Mother   . Lung cancer Brother     and sister  . Cancer Brother   . Melanoma Father   . Cancer Father   . Alzheimer's disease Sister     x 2  . Heart disease Sister   . Hypertension Sister     Past Surgical  History  Procedure Laterality Date  . Prostate surgery    . Tonsillectomy    . Pyloroplasty    . Polypectomy    . Colonoscopy    . Upper gastrointestinal endoscopy    . Cardiac catheterization    . Cataract surgery      both eyes  . Eye surgery    . Femoral-popliteal bypass graft Left 11/27/2012    Procedure: BYPASS GRAFT LEFT ABOVE KNEE TO BELOW KNEE POPLITEAL ARTERY USING LEFT NONREVERSED GREATER SAPPHENOUS VEIN;  Surgeon: Vance W Brabham, MD;  Location: [facility] OR;  Service: Vascular;  Laterality: Left;    Allergies  Allergen Reactions  . Penicillins Other (See Comments)    Boils    Current Outpatient Prescriptions  Medication Sig Dispense Refill  . clopidogrel (PLAVIX) 75 MG tablet Take 1 tablet (75 mg total) by mouth daily.  90 tablet  3  . FLUoxetine (PROZAC) 20 MG capsule Take 20 mg by mouth at bedtime.       . gabapentin (NEURONTIN) 300 MG capsule       . levofloxacin (LEVAQUIN) 500 MG tablet Take 1 tablet (500 mg total) by mouth daily.  14 tablet  0  . levothyroxine (SYNTHROID, LEVOTHROID) 125 MCG tablet Take 125 mcg   by mouth daily before breakfast.      . metoprolol succinate (TOPROL-XL) 50 MG 24 hr tablet Take 25 mg by mouth daily. Take with or immediately following a meal.      . Omega-3 Fatty Acids (OMEGA 3 PO) Take 2 capsules by mouth daily.      . omeprazole (PRILOSEC) 40 MG capsule Take 1 capsule (40 mg total) by mouth daily.  90 capsule  0  . OVER THE COUNTER MEDICATION Place 2 sprays into both nostrils 2 (two) times daily. Over the counter nasal spray.      . Probiotic Product (PROBIOTIC PEARLS ADVANTAGE PO) Take 1 capsule by mouth daily.      . ALPRAZolam (XANAX) 0.5 MG tablet Take 0.5 mg by mouth at bedtime as needed for anxiety.      . atorvastatin (LIPITOR) 40 MG tablet       . mupirocin ointment (BACTROBAN) 2 % 1 Application, nasally, 2 times daily for 5 days prior to surgery  22 g  0  . oxyCODONE (OXY IR/ROXICODONE) 5 MG immediate release tablet Take 1-2  tablets (5-10 mg total) by mouth every 6 (six) hours as needed for moderate pain.  30 tablet  0  . Probiotic Product (PROBIOTIC DAILY PO) Take 1 tablet by mouth daily.       No current facility-administered medications for this visit.    ROS: See HPI for pertinent positives and negatives.   Physical Examination   Filed Vitals:   06/03/13 1532  BP: 110/66  Pulse: 62  Temp: 97.9 F (36.6 C)  TempSrc: Oral  Resp: 16  Height: 5' 9" (1.753 m)  Weight: 190 lb (86.183 kg)  SpO2: 98%   Body mass index is 28.05 kg/(m^2).  General: A&O x 3, WDWN. Gait: normal Eyes: no gross abnormalites Pulmonary: Respirations are non labored.  Cardiac: regular Rythm   Radial pulses: are 2+ palpable and =.                           VASCULAR EXAM: Extremities without ischemic changes  without Gangrene; without open wounds. Left medial thigh has a red, warm, tender, swollen area measuring about 12 cm x 10 cm, no drainage; looks more swollen and red than 2 days ago.                                                                                                          LE Pulses LEFT RIGHT       FEMORAL  3+ palpable  3+ palpable        POPLITEAL 1+ palpable   2+ palpable       POSTERIOR TIBIAL  not palpable   not palpable        DORSALIS PEDIS      ANTERIOR TIBIAL 2+ palpable  not palpable    Abdomen: soft, NT, no masses. Skin: no rashes, no ulcers noted. Musculoskeletal: no muscle wasting or atrophy.  Neurologic: A&O X 3; Appropriate Affect ; SENSATION: normal; MOTOR FUNCTION:    moving all extremities equally. Speech is fluent/normal. CN 2-12 intact.   ASSESSMENT: Walter [name] is a 80 y.o. male who returns today for check of left upper thigh lymphocele drained by Dr. Kellie Simmering at patient's office visit 2 days ago. He is s/p left femoral popliteal bypass graft using non reversed greater saphenous vein on 11/27/12 by Dr. Trula Slade.  He no longer has the generalized malaise feeling, is  afebrile, but the area of redness and swelling at the left medial thigh healed incision has increased in size, redness, and swelling, is warmer than the surrounding tissue.   PLAN:  Continue levofloxacin and periodic warm packs to left medial thigh. Based on the patient's vascular studies and examination, and after discussing with Dr. Oneida Alar who examined and spoke with patient,  pt will be scheduled for June 4, I&D left leg, general anesthesia, by Dr. Trula Slade.  The patient was given information about PAD including signs, symptoms, treatment, what symptoms should prompt the patient to seek immediate medical care, and risk reduction measures to take.  Clemon Chambers, RN, MSN, FNP-C [facility] [employer] Phone: [phone]  Clinic MD: [facility]  06/03/2013 3:44 PM

## 2013-06-10 NOTE — Anesthesia Procedure Notes (Addendum)
Procedure Name: LMA Insertion Date/Time: 06/10/2013 4:58 PM Performed by: Trixie Deis A Pre-anesthesia Checklist: Patient identified, Emergency Drugs available, Suction available, Patient being monitored and Timeout performed Patient Re-evaluated:Patient Re-evaluated prior to inductionOxygen Delivery Method: Circle system utilized Preoxygenation: Pre-oxygenation with 100% oxygen Intubation Type: IV induction Ventilation: Mask ventilation without difficulty LMA: LMA inserted LMA Size: 5.0 Placement Confirmation: positive ETCO2 and breath sounds checked- equal and bilateral

## 2013-06-11 ENCOUNTER — Encounter (HOSPITAL_COMMUNITY): Payer: Self-pay | Admitting: Surgery

## 2013-06-11 ENCOUNTER — Telehealth: Payer: Self-pay | Admitting: Surgery

## 2013-06-11 LAB — CBC
HEMATOCRIT: 34.2 % — AB (ref 39.0–52.0)
Hemoglobin: 11.1 g/dL — ABNORMAL LOW (ref 13.0–17.0)
MCH: 30.8 pg (ref 26.0–34.0)
MCHC: 32.5 g/dL (ref 30.0–36.0)
MCV: 95 fL (ref 78.0–100.0)
Platelets: 237 10*3/uL (ref 150–400)
RBC: 3.6 MIL/uL — AB (ref 4.22–5.81)
RDW: 13.4 % (ref 11.5–15.5)
WBC: 10.6 10*3/uL — AB (ref 4.0–10.5)

## 2013-06-11 MED ORDER — OXYCODONE HCL 5 MG PO TABS: 5.0000 mg | ORAL_TABLET | Freq: Four times a day (QID) | ORAL | Status: DC | PRN

## 2013-06-11 MED ORDER — ENOXAPARIN SODIUM 40 MG/0.4ML ~~LOC~~ SOLN
40.0000 mg | SUBCUTANEOUS | Status: DC
  Administered 2013-06-11: 40 mg via SUBCUTANEOUS
  Filled 2013-06-11 (×2): qty 0.4

## 2013-06-11 MED ORDER — LEVOFLOXACIN 500 MG PO TABS: 500.0000 mg | ORAL_TABLET | Freq: Every day | ORAL | Status: DC

## 2013-06-11 NOTE — Telephone Encounter (Addendum)
Message copied by Doristine Section on Fri Jun 11, 2013  1:51 PM ------      Message from: Denman George      Created: Fri Jun 11, 2013 12:18 PM      Regarding: Micheline Rough; needs f/u 2-3 wks/ VWB                   ----- Message -----         From: Gabriel Earing, PA-C         Sent: 06/11/2013  11:46 AM           To: Vvs Charge Pool            S/p I&D of infected seroma 06/10/13.  Going home with wound vac.  F/u with Dr. Trula Slade in 2-3 weeks.            Thanks      Aldona Bar ------  notified patient of post op visit with dr. Trula Slade on 06-21-13 8:45 am

## 2013-06-11 NOTE — Progress Notes (Signed)
[employer] delivered wound vac to the home and [facility] RN scheduled to come out on tomorrow to set up pt on wound vac. Jonnie Finner RN CCM Case Mgmt phone [phone]

## 2013-06-11 NOTE — Progress Notes (Signed)
Pt d/c home.  Alert and oriented x4.  No c/o pain.  Dressing changed to left leg, wet to dry, dry and intact.  Pt given education on home health care, dressing changes, and their care of wound vac.  Pt educated on diet, activity, meds, and follow-up care and instructions.  Pt verbalized understanding. IV d/c'd.  Pt taken home via private vehicle.

## 2013-06-11 NOTE — Discharge Summary (Signed)
[facility] Discharge Summary  Walter [name] 10-Sep-1933 80 y.o. male  [id number]  Admission Date: 06/10/2013  Discharge Date: 06/11/13  Physician: Serafina Mitchell, MD  Admission Diagnosis: Other noninfectious disorders of lymphatic channels    HPI:   This is a 80 y.o. male patient of Dr. Kellie Simmering who returns today for check of left upper thigh lymphocele drained by Dr. Kellie Simmering at patient's office visit 2 days ago.  He is s/p left femoral popliteal bypass graft using non reversed greater saphenous vein on 11/27/12 by Dr. Trula Slade.  Pt states he no longer feels the generalized bad feeling that he felt 2 days ago.  He states there is swelling in the left medial thigh area after getting out of bed, but not in the morning.  Redness and pain at left medial thigh are greatly relieved by warm packs.  He started taking 500 mg daily levofloxacin on 06/01/13, disp #14, as prescribed by Dr. Oneida Alar, states he had a life threatening reaction to penicillin many years ago and therefore could not take Keflex.  Hospital Course:  The patient was admitted to the hospital and taken to the operating room on 06/10/2013 and underwent: I&D of infected seroma.  The wound was packed with a betadine soaked kerlix.   The pt tolerated the procedure well and was transported to the PACU in good condition.   By POD 1, the gram stain for the anaerobic and anerobic Cx revealed NO WBC SEEN NO SQUAMOUS EPITHELIAL CELLS SEEN NO ORGANISMS SEEN..  The pt has orders to receive a wound vac at home.  Until that time, he will have bid wet to dry dressing changes.  He is discharged with Levaquin.  The remainder of the hospital course consisted of increasing mobilization and increasing intake of solids without difficulty.  CBC    Component Value Date/Time   WBC 10.6* 06/11/2013 0405   RBC 3.60* 06/11/2013 0405   HGB 11.1* 06/11/2013 0405   HCT 34.2* 06/11/2013 0405   PLT 237 06/11/2013 0405   MCV 95.0 06/11/2013  0405   MCH 30.8 06/11/2013 0405   MCHC 32.5 06/11/2013 0405   RDW 13.4 06/11/2013 0405   LYMPHSABS 1.9 03/14/2008 1437   MONOABS 0.5 03/14/2008 1437   EOSABS 0.2 03/14/2008 1437   BASOSABS 0.1 03/14/2008 1437    BMET    Component Value Date/Time   NA 138 06/10/2013 0842   K 4.9 06/10/2013 0842   CL 101 06/10/2013 0842   CO2 22 06/10/2013 0842   GLUCOSE 94 06/10/2013 0842   BUN 29* 06/10/2013 0842   CREATININE 1.37* 06/10/2013 0842   CALCIUM 9.6 06/10/2013 0842   GFRNONAA 48* 06/10/2013 0842   GFRAA 56* 06/10/2013 0842     Discharge Instructions:   The patient is discharged to home with extensive instructions on wound care and progressive ambulation.  They are instructed not to drive or perform any heavy lifting until returning to see the physician in his office.  Discharge Instructions   Call MD for:  redness, tenderness, or signs of infection (pain, swelling, bleeding, redness, odor or green/yellow discharge around incision site)    Complete by:  As directed      Call MD for:  severe or increased pain, loss or decreased feeling  in affected limb(s)    Complete by:  As directed      Call MD for:  temperature >100.5    Complete by:  As directed      Discharge  wound care:    Complete by:  As directed   Wound vac change M/W/F     Driving Restrictions    Complete by:  As directed   No driving for 2 weeks     Resume previous diet    Complete by:  As directed            Discharge Diagnosis:  Other noninfectious disorders of lymphatic channels   Secondary Diagnosis: Patient Active Problem List   Diagnosis Date Noted  . Seroma, infected, postoperative 06/10/2013  . Pain in limb-Left Medial thigh 06/03/2013  . Post op infection-Left Medial Thigh 06/03/2013  . Atherosclerosis of native arteries of the extremities with ulceration(440.23) 01/04/2013  . Peripheral vascular disease, unspecified 11/09/2012  . Atherosclerosis of native arteries of the extremities with intermittent claudication 10/19/2012    . HEMATOMA 09/06/2009  . WEIGHT LOSS-ABNORMAL 05/04/2009  . ABDOMINAL PAIN -GENERALIZED 05/04/2009  . CHEST PAIN 10/26/2007  . COLONIC POLYPS 06/09/2007  . HYPOTHYROIDISM 06/09/2007  . DEPRESSION 06/09/2007  . HYPERTENSION 06/09/2007  . RESIDUAL HEMORRHOIDAL SKIN TAGS 06/09/2007  . GERD 06/09/2007  . BARRETTS ESOPHAGUS 06/09/2007  . HIATAL HERNIA 06/09/2007  . DIVERTICULOSIS, COLON 06/09/2007  . ARTHRITIS 06/09/2007   Past Medical History  Diagnosis Date  . Anxiety   . Hypertension   . Barrett's esophagus     "never dilated" (06/10/2013)  . Diverticulosis   . Colon polyps     tubular adenoma-last colon 12/18/2005  . Hiatal hernia   . Depression   . Hematoma     on mid back  . Hypothyroidism     takes synthroid  . GERD (gastroesophageal reflux disease)     takes omeprazole  . Peripheral vascular disease   . High cholesterol   . Arthritis     "legs and feet" (06/10/2013)       Medication List         acetaminophen 325 MG tablet  Commonly known as:  TYLENOL  Take 650 mg by mouth every 6 (six) hours as needed for mild pain, moderate pain or fever.     atorvastatin 40 MG tablet  Commonly known as:  LIPITOR  Take 40 mg by mouth daily.     clopidogrel 75 MG tablet  Commonly known as:  PLAVIX  Take 1 tablet (75 mg total) by mouth daily.     FLUoxetine 20 MG capsule  Commonly known as:  PROZAC  Take 20 mg by mouth at bedtime.     gabapentin 300 MG capsule  Commonly known as:  NEURONTIN  Take 300 mg by mouth at bedtime.     levofloxacin 500 MG tablet  Commonly known as:  LEVAQUIN  Take 1 tablet (500 mg total) by mouth daily.     levothyroxine 125 MCG tablet  Commonly known as:  SYNTHROID, LEVOTHROID  Take 125 mcg by mouth daily before breakfast.     metoprolol succinate 50 MG 24 hr tablet  Commonly known as:  TOPROL-XL  Take 25 mg by mouth daily. Take with or immediately following a meal.     OMEGA 3 PO  Take 2 capsules by mouth daily.     omeprazole  40 MG capsule  Commonly known as:  PRILOSEC  Take 1 capsule (40 mg total) by mouth daily.     OVER THE COUNTER MEDICATION  Place 2 sprays into both nostrils 2 (two) times daily. Over the counter nasal spray.     oxyCODONE 5 MG immediate release tablet  Commonly known as:  Oxy IR/ROXICODONE  Take 1 tablet (5 mg total) by mouth every 6 (six) hours as needed for moderate pain.     PROBIOTIC PEARLS ADVANTAGE PO  Take 1 capsule by mouth daily.        Roxicodone #30 No Refill  Disposition: home  Patient's condition: is Good  Follow up: 1. Dr. Trula Slade in 2-3 weeks   Leontine Locket, PA-C [facility] [phone] 06/11/2013  11:59 AM  - For VQI Registry use --- Instructions: Press F2 to tab through selections.  Delete question if not applicable.   Post-op:  Wound infection: Yes - drainage of infected seroma Graft infection: No  Transfusion: No  If yes, n/a units given New Arrhythmia: No Ipsilateral amputation: No, [ ]  Minor, [ ]  BKA, [ ]  AKA Discharge patency: [ ]  Primary, [ ]  Primary assisted, [ ]  Secondary, [ ]  Occluded Patency judged by: [ ]  Dopper only, [ ]  Palpable graft pulse, [ ]  Palpable distal pulse, [ ]  ABI inc. > 0.15, [ ]  Duplex Discharge ABI: R n/a, L n/a Discharge TBI: R , L  D/C Ambulatory Status: Ambulatory  Complications: MI: No, [ ]  Troponin only, [ ]  EKG or Clinical CHF: No Resp failure:No, [ ]  Pneumonia, [ ]  Ventilator Chg in renal function: No, [ ]  Inc. Cr > 0.5, [ ]  Temp. Dialysis, [ ]  Permanent dialysis Stroke: No, [ ]  Minor, [ ]  Major Return to OR: No  Reason for return to OR: [ ]  Bleeding, [ ]  Infection, [ ]  Thrombosis, [ ]  Revision  Discharge medications: Statin use:  yes ASA use:  no Plavix use:  yes Beta blocker use: no Coumadin use: no

## 2013-06-11 NOTE — Progress Notes (Signed)
Utilization review completed. Leyda Vanderwerf, RN, BSN. 

## 2013-06-11 NOTE — Anesthesia Postprocedure Evaluation (Signed)
Anesthesia Post Note  Patient: Walter [name]  Procedure(s) Performed: Procedure(s) (LRB): IRRIGATION AND DEBRIDEMENT OF LEFT UPPER LEG (Left)  Anesthesia type: general  Patient location: PACU  Post pain: Pain level controlled  Post assessment: Patient's Cardiovascular Status Stable  Last Vitals:  Filed Vitals:   06/11/13 0409  BP: 130/55  Pulse: 52  Temp: 36.4 C  Resp: 18    Post vital signs: Reviewed and stable  Level of consciousness: sedated  Complications: No apparent anesthesia complications

## 2013-06-11 NOTE — Progress Notes (Addendum)
[facility] [place]  Subjective  - He states his left leg feels better.   Objective 130/55 52 97.6 F (36.4 C) (Oral) 18 100%  Intake/Output Summary (Last 24 hours) at 06/11/13 0739 Last data filed at 06/11/13 0400  Gross per 24 hour  Intake 1703.33 ml  Output      0 ml  Net 1703.33 ml    Doppler signals DP/PT left LE Batadine dressing intact left thigh  Assessment/Planning: POD #1 I&D left leg seroma S/P left femoral popliteal bypass graft using non reversed greater saphenous vein on 11/27/12 by Dr. Trula Slade Intraoperative cultures pending WBC 10.6 on Levaquin IV antibiotics Lovenox DVT prophylaxis.   Walter [name] 06/11/2013 7:39 AM --  Laboratory Lab Results:  Recent Labs  06/10/13 0842 06/11/13 0405  WBC 8.9 10.6*  HGB 11.0* 11.1*  HCT 31.8* 34.2*  PLT 256 237   BMET  Recent Labs  06/10/13 0842  NA 138  K 4.9  CL 101  CO2 22  GLUCOSE 94  BUN 29*  CREATININE 1.37*  CALCIUM 9.6    COAG Lab Results  Component Value Date   INR 0.96 11/24/2012   INR 1.1 03/15/2008   INR 1.1 03/14/2008   No results found for this basename: PTT    I agree with the above Plan for d/c home with wound vac and abx  Walter [name]  Wound measures ~ 4cm length x 1.5cm width x 2.5cm depth  Walter [name] Walter [name] 06/11/2013 2:47 PM

## 2013-06-11 NOTE — Progress Notes (Addendum)
CARE MANAGEMENT NOTE 06/11/2013  Patient:  Walter [name], Walter [name]   Account Number:  [id number]  Date Initiated:  06/11/2013  Documentation initiated by:   [facility]  Subjective/Objective Assessment:   I&D of infected seroma     Action/Plan:   home with wife, [facility] for dsg changes   Anticipated DC Date:  06/11/2013   Anticipated DC Plan:  [place]  CM consult      [facility] Choice  HOME HEALTH   Choice offered to / List presented to:  C-1 Patient        [place] arranged  HH-1 RN      [place].   Status of service:   Medicare Important Message given?  YES (If response is "NO", the following Medicare IM given date fields will be blank) Date Medicare IM given:  06/11/2013 Date Additional Medicare IM given:    Discharge Disposition:  [place]  Per UR Regulation:  Reviewed for med. necessity/level of care/duration of stay  If discussed at [place] of Stay Meetings, dates discussed:    Comments:  06/11/2013 1500 Received call from [facility] rep and additional info needed on wound measurements and [place] agency. Faxed requested info to rep. Notified [facility] of dc home with [facility] RN drsg changes needed. Jonnie Finner RN CCM Case Mgmt phone [phone]   06/11/2013 12:45 PM  NCM spoke to pt and offered choice for [facility]. Pt agreeable to [facility] for [facility] RN for wound vac and dressing changes. Spoke to [employer], [place], [phone]. Requested info for wound vac be faxed to [phone]. Faxed order, facesheet, H&P, and op note. Waiting for call back to follow up on delivery of wound vac. Jonnie Finner RN CCM Case Mgmt phone [phone]

## 2013-06-12 DIAGNOSIS — T8189XA Other complications of procedures, not elsewhere classified, initial encounter: Secondary | ICD-10-CM | POA: Diagnosis not present

## 2013-06-13 LAB — WOUND CULTURE
CULTURE: NO GROWTH
Gram Stain: NONE SEEN

## 2013-06-14 DIAGNOSIS — T8189XA Other complications of procedures, not elsewhere classified, initial encounter: Secondary | ICD-10-CM | POA: Diagnosis not present

## 2013-06-14 NOTE — Discharge Summary (Signed)
I agree with the above  Wells Brabham 

## 2013-06-15 LAB — ANAEROBIC CULTURE: GRAM STAIN: NONE SEEN

## 2013-06-16 DIAGNOSIS — T8189XA Other complications of procedures, not elsewhere classified, initial encounter: Secondary | ICD-10-CM | POA: Diagnosis not present

## 2013-06-18 ENCOUNTER — Encounter: Payer: Self-pay | Admitting: Surgery

## 2013-06-19 DIAGNOSIS — T8189XA Other complications of procedures, not elsewhere classified, initial encounter: Secondary | ICD-10-CM | POA: Diagnosis not present

## 2013-06-21 ENCOUNTER — Ambulatory Visit: Payer: Medicare Other | Admitting: Surgery

## 2013-06-21 DIAGNOSIS — T8189XA Other complications of procedures, not elsewhere classified, initial encounter: Secondary | ICD-10-CM | POA: Diagnosis not present

## 2013-06-23 ENCOUNTER — Other Ambulatory Visit: Payer: Self-pay | Admitting: Internal Medicine

## 2013-06-23 DIAGNOSIS — T8189XA Other complications of procedures, not elsewhere classified, initial encounter: Secondary | ICD-10-CM | POA: Diagnosis not present

## 2013-06-24 ENCOUNTER — Encounter: Payer: Self-pay | Admitting: Internal Medicine

## 2013-06-24 ENCOUNTER — Telehealth: Payer: Self-pay | Admitting: Internal Medicine

## 2013-06-25 ENCOUNTER — Encounter: Payer: Self-pay | Admitting: Surgery

## 2013-06-25 DIAGNOSIS — T8189XA Other complications of procedures, not elsewhere classified, initial encounter: Secondary | ICD-10-CM | POA: Diagnosis not present

## 2013-06-25 MED ORDER — OMEPRAZOLE 40 MG PO CPDR: 40.0000 mg | DELAYED_RELEASE_CAPSULE | Freq: Every day | ORAL | Status: DC

## 2013-06-25 NOTE — Telephone Encounter (Signed)
ERROR

## 2013-06-25 NOTE — Telephone Encounter (Signed)
Patient's most recent EGD was 18 months ago and he was told her needed an office visit for further refills.  Patient called and told me he has had several health problems and surgeries recently and can't come in at this time.  I told him I would refill his Omeprazole if he would promise to call as soon as he was able and schedule an office visit.  Patient agreed.

## 2013-06-28 ENCOUNTER — Ambulatory Visit (INDEPENDENT_AMBULATORY_CARE_PROVIDER_SITE_OTHER): Payer: Self-pay | Admitting: Surgery

## 2013-06-28 ENCOUNTER — Other Ambulatory Visit: Payer: Self-pay | Admitting: *Deleted

## 2013-06-28 ENCOUNTER — Encounter: Payer: Self-pay | Admitting: Surgery

## 2013-06-28 VITALS — BP 127/67 | HR 66 | Resp 18 | Ht 69.0 in | Wt 195.0 lb

## 2013-06-28 DIAGNOSIS — T8140XA Infection following a procedure, unspecified, initial encounter: Secondary | ICD-10-CM

## 2013-06-28 DIAGNOSIS — T8189XA Other complications of procedures, not elsewhere classified, initial encounter: Secondary | ICD-10-CM | POA: Diagnosis not present

## 2013-06-28 DIAGNOSIS — I739 Peripheral vascular disease, unspecified: Secondary | ICD-10-CM

## 2013-06-28 NOTE — Addendum Note (Signed)
Addended by: Mena Goes on: 06/28/2013 01:15 PM   Modules accepted: Orders

## 2013-06-28 NOTE — Progress Notes (Signed)
On 11/26/2013 he underwent a left femoral to above-knee popliteal artery bypass graft with ipsilateral non-reversed translocated greater saphenous vein. This was done for an early ischemic ulcer.  He recently developed an infected lymphocele from a vein harvest site.  He underwent incision and drainage of the left leg seroma on 06/10/2013.  A wound VAC was placed.  His postoperative visit is today.  The wound looks excellent.  There is beefy red granulation tissue the wound is very superficial.  I'm discontinuing the wound VAC and placing a wet to dry dressing change.  I suspect this will heal within the next one to 2 weeks.  I have scheduled the patient for followup in one month for a wound check.  He'll come back in October for a duplex of his bypass graft graft

## 2013-06-29 DIAGNOSIS — T8189XA Other complications of procedures, not elsewhere classified, initial encounter: Secondary | ICD-10-CM | POA: Diagnosis not present

## 2013-07-01 DIAGNOSIS — T8189XA Other complications of procedures, not elsewhere classified, initial encounter: Secondary | ICD-10-CM | POA: Diagnosis not present

## 2013-07-30 ENCOUNTER — Encounter: Payer: Self-pay | Admitting: Surgery

## 2013-08-02 ENCOUNTER — Ambulatory Visit (INDEPENDENT_AMBULATORY_CARE_PROVIDER_SITE_OTHER): Payer: Self-pay | Admitting: Surgery

## 2013-08-02 ENCOUNTER — Encounter: Payer: Self-pay | Admitting: Surgery

## 2013-08-02 VITALS — BP 120/57 | HR 53 | Ht 69.0 in | Wt 195.1 lb

## 2013-08-02 DIAGNOSIS — I739 Peripheral vascular disease, unspecified: Secondary | ICD-10-CM

## 2013-08-02 NOTE — Progress Notes (Signed)
On 11/26/2013 he underwent a left femoral to above-knee popliteal artery bypass graft with ipsilateral non-reversed translocated greater saphenous vein. This was done for an early ischemic ulcer. He developed a late infected lymphocele from a vein harvest site. He underwent incision and drainage of the left leg seroma on 06/10/2013. A wound VAC was placed.  He is here today for followup.  His back was discontinued and his last visit.  The wound has completely healed.  There is no evidence of infection.  The patient does complain of edema in his leg which has been there since surgery.  I have recommended leg elevation and compression stockings.  Hopefully this will resolve now that the lymphocele issue has been taking care of.  The patient is scheduled to followup in October with a duplex of his bypass graft.

## 2013-08-09 ENCOUNTER — Ambulatory Visit: Payer: Medicare Other | Admitting: Surgery

## 2013-08-19 ENCOUNTER — Ambulatory Visit: Payer: Medicare Other | Admitting: Nurse Practitioner

## 2013-08-23 ENCOUNTER — Telehealth: Payer: Self-pay | Admitting: *Deleted

## 2013-08-23 ENCOUNTER — Encounter: Payer: Self-pay | Admitting: Nurse Practitioner

## 2013-08-23 ENCOUNTER — Ambulatory Visit (INDEPENDENT_AMBULATORY_CARE_PROVIDER_SITE_OTHER): Payer: Medicare Other | Admitting: Nurse Practitioner

## 2013-08-23 VITALS — BP 122/64 | HR 74 | Ht 69.0 in | Wt 199.2 lb

## 2013-08-23 DIAGNOSIS — K2270 Barrett's esophagus without dysplasia: Secondary | ICD-10-CM | POA: Diagnosis not present

## 2013-08-23 DIAGNOSIS — I739 Peripheral vascular disease, unspecified: Secondary | ICD-10-CM

## 2013-08-23 DIAGNOSIS — L98499 Non-pressure chronic ulcer of skin of other sites with unspecified severity: Secondary | ICD-10-CM

## 2013-08-23 NOTE — Telephone Encounter (Signed)
Routed letter to Dr. Irish Lack.

## 2013-08-23 NOTE — Telephone Encounter (Signed)
08/23/2013   RE: Walter [name] DOB: [birth year] MRN: [id number]   Dear Dr. Larae Grooms,    We have scheduled the above patient for an endoscopic procedure. Our records show that he is on anticoagulation therapy.   Please advise as to how long the patient may come off his therapy of  Plavix prior to the procedure, which is scheduled for 09-02-2013.  Please fax back/ or route the completed form to [place] at [phone].   Sincerely,    [name] NP-C    [place]

## 2013-08-23 NOTE — Patient Instructions (Signed)
You have been scheduled for an endoscopy. Please follow written instructions given to you at your visit today. If you use inhalers (even only as needed), please bring them with you on the day of your procedure. Your physician has requested that you go to www.startemmi.com and enter the access code given to you at your visit today. This web site gives a general overview about your procedure. However, you should still follow specific instructions given to you by our office regarding your preparation for the procedure.  We will call you once we hear from Dr. Irish Lack regarding the Plavix

## 2013-08-24 ENCOUNTER — Encounter: Payer: Self-pay | Admitting: Nurse Practitioner

## 2013-08-24 NOTE — Progress Notes (Signed)
Agree with initial assessment and plans as outlined 

## 2013-08-24 NOTE — Progress Notes (Signed)
     History of Present Illness:   Patient is a 80 year old male followed by Dr. Henrene Pastor. He has a history of Eric's esophagus. Upper endoscopy with biopsies in 2012 confirmed Barrett's esophagus with some atypical cells present. Followup EGD with esophageal biopsies and 2013 again showed Barrett's esophagus but no dysplasia or atypical cells. Patient is overdue for surveillance EGD which he comes in now to discuss. No GI complaints.   Current Medications, Allergies, Past Medical History, Past Surgical History, Family History and Social History were reviewed in [employer] record.   Physical Exam: General: Pleasant, well developed , white male in no acute distress Head: Normocephalic and atraumatic Eyes:  sclerae anicteric, conjunctiva pink  Ears: Normal auditory acuity Lungs: Clear throughout to auscultation Heart: Regular rate and rhythm Abdomen: Soft, non distended, non-tender. No masses, no hepatomegaly. Normal bowel sounds Musculoskeletal: Symmetrical with no gross deformities  Extremities: No edema  Neurological: Alert oriented x 4, grossly nonfocal Psychological:  Alert and cooperative. Normal mood and affect  Assessment and Recommendations:  68. 80 year old male with Barrett's esophagus and history of atypical cells on biopsy in 2012 . Repeat biopsies in 2013 again c/w with Barrett's but no dysplasia or atypical looking cells. Patient is overdue for surveillance EGD. The benefits, risks, and potential complications of EGD with possible biopsies were discussed with the patient and he agrees to proceed.   2. PVD, s/p left popliteal knee popliteal artery bypass graft November 2014. He is on chronic Plavix. Will contact prescribing provider about holding Plavix for EGD.

## 2013-08-26 ENCOUNTER — Telehealth: Payer: Self-pay | Admitting: *Deleted

## 2013-08-26 NOTE — Telephone Encounter (Signed)
I left a message for Arbie Cookey, Dr. Stephens Shire nurse to please call me. I advised on my message that Dr. Irish Lack was going to route my anticoulation letter to DR. Brabham and get his input on this for the pt to hold Plavix prior to colonoscopy.

## 2013-08-27 NOTE — Telephone Encounter (Signed)
Spoke with Dr. Trula Slade regarding request for pt. To hold Plavix 5 days prior to Colonoscopy.  Per Dr. Trula Slade, from Vascular Surgery standpoint, it is okay for pt. to hold Plavix 5 days prior to Colonoscopy.  Notified Pam P., CMA, of approval to hold Plavix prior to procedure.

## 2013-08-27 NOTE — Telephone Encounter (Signed)
I called the patient and left a message on his answering machine. ( He asked me to do this earlier today).  I advised him to not take the Plavix on Sat 08-28-2013 and stay off of it until the day after the procedure.(09-03-2013) . I told him earlier today we thought these would be the instructions but Dr. Irish Lack wanted Dr. Stephens Shire okay for this as well.  Carol from Dr. Stephens Shire office called me and routed me the note with these instructions.

## 2013-08-27 NOTE — Telephone Encounter (Signed)
See phone note from 08-27-2013.

## 2013-08-27 NOTE — Telephone Encounter (Signed)
I called Stephanie at Dr. Stephens Shire office and she said Dr. Trula Slade is on call but she will contact him to address this for [place] today. She said she will call me back.

## 2013-09-02 ENCOUNTER — Encounter: Payer: Self-pay | Admitting: Internal Medicine

## 2013-09-02 ENCOUNTER — Other Ambulatory Visit: Payer: Self-pay | Admitting: Internal Medicine

## 2013-09-02 ENCOUNTER — Ambulatory Visit (AMBULATORY_SURGERY_CENTER): Payer: Medicare Other | Admitting: Internal Medicine

## 2013-09-02 VITALS — BP 189/85 | HR 53 | Temp 97.3°F | Resp 19 | Ht 69.0 in | Wt 199.0 lb

## 2013-09-02 DIAGNOSIS — E669 Obesity, unspecified: Secondary | ICD-10-CM | POA: Diagnosis not present

## 2013-09-02 DIAGNOSIS — K2270 Barrett's esophagus without dysplasia: Secondary | ICD-10-CM | POA: Diagnosis not present

## 2013-09-02 DIAGNOSIS — I10 Essential (primary) hypertension: Secondary | ICD-10-CM | POA: Diagnosis not present

## 2013-09-02 MED ORDER — SODIUM CHLORIDE 0.9 % IV SOLN: 500.0000 mL | INTRAVENOUS | Status: DC

## 2013-09-02 NOTE — Op Note (Addendum)
[facility]  [address] [place], [address]   ENDOSCOPY PROCEDURE REPORT  PATIENT: Walter, [name]  MR#: [id number] BIRTHDATE: 10/30/1933 , 79  yrs. old GENDER: Male ENDOSCOPIST: Eustace Quail, MD REFERRED BY:  Surveillance Program Recall PROCEDURE DATE:  09/02/2013 PROCEDURE:  EGD w/ biopsy ASA CLASS:     Class II INDICATIONS:  history of Barrett's esophagus. Multiple prior EGDs with biopsies. Has a history of low-grade dysplasia and atypia on biopsies. Last examination 2013 with out dysplasia or atypia. MEDICATIONS: MAC sedation, administered by CRNA and propofol (Diprivan) 200mg  IV TOPICAL ANESTHETIC: none  DESCRIPTION OF PROCEDURE: After the risks benefits and alternatives of the procedure were thoroughly explained, informed consent was obtained.  The LB [id number] endoscope was introduced through the mouth and advanced to the second portion of the duodenum. Without limitations.  The instrument was slowly withdrawn as the mucosa was fully examined.      EXAM:The esophagus revealed Barrett's in the distal third (C7, M8). This was examined with white light, narrowed and imaging, and magnification.  No irregularities within the Barrett's. Four-quadrant biopsies taken every 1.5 cm..  The stomach and duodenum were normal.  Retroflexed views revealed a hiatal hernia. The scope was then withdrawn from the patient and the procedure completed.  COMPLICATIONS: There were no complications. ENDOSCOPIC IMPRESSION: 1. Barrett's in the distal third (C7, M 8).  Status post biopsies 2. GERD  RECOMMENDATIONS: 1.  Await biopsy results. Followup EGD timing to be determined (Dr. Henrene Pastor will send you a letter) 2.  Continue PPI therapy daily  REPEAT EXAM:  eSigned:  Eustace Quail, MD 09/02/2013 3:44 PM   VF:[facility] Alroy Dust, MD and The Patient

## 2013-09-02 NOTE — Patient Instructions (Signed)

## 2013-09-02 NOTE — Progress Notes (Signed)
Report to PACU, RN, vss, BBS= Clear.  

## 2013-09-02 NOTE — Progress Notes (Signed)
Called to room to assist during endoscopic procedure.  Patient ID and intended procedure confirmed with present staff. Received instructions for my participation in the procedure from the performing physician.  

## 2013-09-03 ENCOUNTER — Telehealth: Payer: Self-pay | Admitting: *Deleted

## 2013-09-03 NOTE — Telephone Encounter (Signed)
  Follow up Call-  Call back number 09/02/2013 12/31/2011  Post procedure Call Back phone  # [phone]  Permission to leave phone message Yes No  comments - no answering machine.     Patient questions:  Do you have a fever, pain , or abdominal swelling? No. Pain Score  0 *  Have you tolerated food without any problems? Yes.    Have you been able to return to your normal activities? Yes.    Do you have any questions about your discharge instructions: Diet   No. Medications  No. Follow up visit  No.  Do you have questions or concerns about your Care? No.  Actions: * If pain score is 4 or above: No action needed, pain <4.

## 2013-09-08 ENCOUNTER — Encounter: Payer: Self-pay | Admitting: Internal Medicine

## 2013-09-23 DIAGNOSIS — Z23 Encounter for immunization: Secondary | ICD-10-CM | POA: Diagnosis not present

## 2013-09-23 DIAGNOSIS — N183 Chronic kidney disease, stage 3 unspecified: Secondary | ICD-10-CM | POA: Diagnosis not present

## 2013-09-23 DIAGNOSIS — I10 Essential (primary) hypertension: Secondary | ICD-10-CM | POA: Diagnosis not present

## 2013-09-23 DIAGNOSIS — F411 Generalized anxiety disorder: Secondary | ICD-10-CM | POA: Diagnosis not present

## 2013-09-23 DIAGNOSIS — R609 Edema, unspecified: Secondary | ICD-10-CM | POA: Diagnosis not present

## 2013-09-23 DIAGNOSIS — I70219 Atherosclerosis of native arteries of extremities with intermittent claudication, unspecified extremity: Secondary | ICD-10-CM | POA: Diagnosis not present

## 2013-09-23 DIAGNOSIS — E039 Hypothyroidism, unspecified: Secondary | ICD-10-CM | POA: Diagnosis not present

## 2013-09-23 DIAGNOSIS — M159 Polyosteoarthritis, unspecified: Secondary | ICD-10-CM | POA: Diagnosis not present

## 2013-09-23 DIAGNOSIS — E7800 Pure hypercholesterolemia, unspecified: Secondary | ICD-10-CM | POA: Diagnosis not present

## 2013-09-28 ENCOUNTER — Other Ambulatory Visit: Payer: Self-pay | Admitting: Internal Medicine

## 2013-10-22 ENCOUNTER — Encounter: Payer: Self-pay | Admitting: Surgery

## 2013-10-25 ENCOUNTER — Ambulatory Visit (HOSPITAL_COMMUNITY)
Admission: RE | Admit: 2013-10-25 | Discharge: 2013-10-25 | Disposition: A | Discharge status: Home / Self Care | Payer: Medicare Other | Source: Ambulatory Visit | Attending: Surgery | Admitting: Surgery

## 2013-10-25 ENCOUNTER — Ambulatory Visit (INDEPENDENT_AMBULATORY_CARE_PROVIDER_SITE_OTHER): Payer: Medicare Other | Admitting: Surgery

## 2013-10-25 ENCOUNTER — Ambulatory Visit (INDEPENDENT_AMBULATORY_CARE_PROVIDER_SITE_OTHER)
Admission: RE | Admit: 2013-10-25 | Discharge: 2013-10-25 | Disposition: A | Discharge status: Home / Self Care | Payer: Medicare Other | Source: Ambulatory Visit | Attending: Surgery | Admitting: Surgery

## 2013-10-25 ENCOUNTER — Encounter: Payer: Self-pay | Admitting: Surgery

## 2013-10-25 VITALS — BP 124/57 | HR 52 | Ht 69.0 in | Wt 199.0 lb

## 2013-10-25 DIAGNOSIS — L98499 Non-pressure chronic ulcer of skin of other sites with unspecified severity: Secondary | ICD-10-CM

## 2013-10-25 DIAGNOSIS — Z48812 Encounter for surgical aftercare following surgery on the circulatory system: Secondary | ICD-10-CM | POA: Diagnosis not present

## 2013-10-25 DIAGNOSIS — I739 Peripheral vascular disease, unspecified: Secondary | ICD-10-CM

## 2013-10-25 NOTE — Addendum Note (Signed)
Addended by: Mena Goes on: 10/25/2013 02:09 PM   Modules accepted: Orders

## 2013-10-25 NOTE — Progress Notes (Signed)
Patient name: Walter [name] MRN: [id number] DOB: 19-Dec-1933 Sex: male     Chief Complaint  Patient presents with  . Re-evaluation    3 month f/u     HISTORY OF PRESENT ILLNESS: On 11/26/2013 he underwent a left femoral to above-knee popliteal artery bypass graft with ipsilateral non-reversed translocated greater saphenous vein. This was done for an early ischemic ulcer. He developed a late infected lymphocele from a vein harvest site. He underwent incision and drainage of the left leg seroma on 06/10/2013. A wound VAC was placed. He is here today for followup.  All of his wounds have healed.  He has no complaints other than a little bit of swelling in his left leg which has been getting better.  He does wear compression stockings which helped.   Past Medical History  Diagnosis Date  . Anxiety   . Hypertension   . Barrett's esophagus     "never dilated" (06/10/2013)  . Diverticulosis   . Colon polyps     tubular adenoma-last colon 12/18/2005  . Hiatal hernia   . Depression   . Hematoma     on mid back  . Hypothyroidism     takes synthroid  . GERD (gastroesophageal reflux disease)     takes omeprazole  . Peripheral vascular disease   . High cholesterol   . Arthritis     "legs and feet" (06/10/2013)    Past Surgical History  Procedure Laterality Date  . Transurethral resection of prostate    . Tonsillectomy    . Pyloroplasty    . Polypectomy      stomach  . Colonoscopy    . Upper gastrointestinal endoscopy    . Cardiac catheterization    . Cataract extraction w/ intraocular lens  implant, bilateral Bilateral   . Eye surgery    . Femoral-popliteal bypass graft Left 11/27/2012    Procedure: BYPASS GRAFT LEFT ABOVE KNEE TO BELOW KNEE POPLITEAL ARTERY USING LEFT NONREVERSED GREATER SAPPHENOUS VEIN;  Surgeon: Serafina Mitchell, MD;  Location: [facility];  Service: Vascular;  Laterality: Left;  . I&d extremity Left 06/10/2013    leg; "from the OR in 11/2012"  . I&d extremity Left  06/10/2013    Procedure: IRRIGATION AND DEBRIDEMENT OF LEFT UPPER LEG;  Surgeon: Serafina Mitchell, MD;  Location: [facility];  Service: Vascular;  Laterality: Left;    History   Social History  . Marital Status: Married    Spouse Name: N/A    Number of Children: N/A  . Years of Education: N/A   Occupational History  . Retired    Social History Main Topics  . Smoking status: Former Smoker -- 1.00 packs/day for 20 years    Types: Cigarettes    Quit date: 06/28/1985  . Smokeless tobacco: Never Used  . Alcohol Use: No     Comment: 06/10/2013 "drank recreationally when I did drink; stopped ~ 2010"  . Drug Use: No  . Sexual Activity: No   Other Topics Concern  . Not on file   Social History Narrative  . No narrative on file    Family History  Problem Relation Age of Onset  . Colon cancer Neg Hx   . Heart disease Mother   . Lung cancer Brother     and sister  . Cancer Brother   . Melanoma Father   . Cancer Father   . Alzheimer's disease Sister     x 2  . Heart disease  Sister   . Hypertension Sister     Allergies as of 10/25/2013 - Review Complete 10/25/2013  Allergen Reaction Noted  . Penicillins Other (See Comments)     Current Outpatient Prescriptions on File Prior to Visit  Medication Sig Dispense Refill  . acetaminophen (TYLENOL) 325 MG tablet Take 650 mg by mouth every 6 (six) hours as needed for mild pain, moderate pain or fever.      Marland Kitchen amLODipine (NORVASC) 5 MG tablet Take 5 mg by mouth daily.      Marland Kitchen atorvastatin (LIPITOR) 40 MG tablet Take 40 mg by mouth daily.       . clopidogrel (PLAVIX) 75 MG tablet Take 1 tablet (75 mg total) by mouth daily.  90 tablet  3  . levothyroxine (SYNTHROID, LEVOTHROID) 125 MCG tablet Take 125 mcg by mouth daily before breakfast.      . Omega-3 Fatty Acids (OMEGA 3 PO) Take 2 capsules by mouth daily.      Marland Kitchen omeprazole (PRILOSEC) 40 MG capsule TAKE 1 CAPSULE DAILY.  90 capsule  1  . OVER THE COUNTER MEDICATION Place 2 sprays into both  nostrils 2 (two) times daily. Over the counter nasal spray.      . Probiotic Product (PROBIOTIC PEARLS ADVANTAGE PO) Take 1 capsule by mouth daily.      Marland Kitchen gabapentin (NEURONTIN) 300 MG capsule Take 300 mg by mouth at bedtime.        No current facility-administered medications on file prior to visit.     REVIEW OF SYSTEMS: Cardiovascular: No chest pain, chest pressure, palpitations, orthopnea, or dyspnea on exertion.  Positive leg swelling Pulmonary: No productive cough, asthma or wheezing. Neurologic: No weakness, paresthesias, aphasia, or amaurosis. No dizziness. Hematologic: No bleeding problems or clotting disorders. Musculoskeletal: No joint pain or joint swelling. Gastrointestinal: No blood in stool or hematemesis Genitourinary: No dysuria or hematuria. Psychiatric:: No history of major depression. Integumentary: No rashes or ulcers. Constitutional: No fever or chills.  PHYSICAL EXAMINATION:   Vital signs are BP 124/57  Pulse 52  Ht 5\' 9"  (1.753 m)  Wt 199 lb (90.266 kg)  BMI 29.37 kg/m2  SpO2 100% General: The patient appears their stated age. HEENT:  No gross abnormalities Pulmonary:  Non labored breathing  Musculoskeletal: There are no major deformities. Neurologic: No focal weakness or paresthesias are detected, Skin: There are no ulcer or rashes noted. Psychiatric: The patient has normal affect. Cardiovascular: There is a regular rate and rhythm without significant murmur appreciated.  1+ edema to the left leg   Diagnostic Studies Ultrasound was ordered and reviewed.  The bypass graft is widely patent.  ABI is 0.86 with biphasic waveforms.  Peak velocity at the proximal anastomosis is to 15  Assessment: Status post left femoropopliteal bypass graft Plan: All the patient's wounds have healed.  His claudication symptoms have improved.  All of his wounds have healed.  He will followup in 6 months with a duplex of his bypass graft.  Peak velocity at the proximal  anastomosis was to 15 cm/s today.  His preoperative carotid duplex was normal  V. Leia Alf, M.D. [facility] Office: [phone] Pager:  [phone]

## 2013-11-04 DIAGNOSIS — M25511 Pain in right shoulder: Secondary | ICD-10-CM | POA: Diagnosis not present

## 2013-11-19 DIAGNOSIS — M25511 Pain in right shoulder: Secondary | ICD-10-CM | POA: Diagnosis not present

## 2013-11-23 DIAGNOSIS — M25511 Pain in right shoulder: Secondary | ICD-10-CM | POA: Diagnosis not present

## 2013-11-26 DIAGNOSIS — M25511 Pain in right shoulder: Secondary | ICD-10-CM | POA: Diagnosis not present

## 2013-11-29 DIAGNOSIS — M25511 Pain in right shoulder: Secondary | ICD-10-CM | POA: Diagnosis not present

## 2013-12-01 DIAGNOSIS — M25511 Pain in right shoulder: Secondary | ICD-10-CM | POA: Diagnosis not present

## 2013-12-07 DIAGNOSIS — M25511 Pain in right shoulder: Secondary | ICD-10-CM | POA: Diagnosis not present

## 2013-12-10 DIAGNOSIS — M25511 Pain in right shoulder: Secondary | ICD-10-CM | POA: Diagnosis not present

## 2013-12-15 DIAGNOSIS — M25511 Pain in right shoulder: Secondary | ICD-10-CM | POA: Diagnosis not present

## 2013-12-16 ENCOUNTER — Encounter (HOSPITAL_COMMUNITY): Payer: Self-pay | Admitting: Interventional Cardiology

## 2013-12-17 DIAGNOSIS — M25511 Pain in right shoulder: Secondary | ICD-10-CM | POA: Diagnosis not present

## 2013-12-23 DIAGNOSIS — M25511 Pain in right shoulder: Secondary | ICD-10-CM | POA: Diagnosis not present

## 2013-12-27 DIAGNOSIS — M25511 Pain in right shoulder: Secondary | ICD-10-CM | POA: Diagnosis not present

## 2013-12-28 ENCOUNTER — Other Ambulatory Visit: Payer: Self-pay | Admitting: Interventional Cardiology

## 2013-12-29 DIAGNOSIS — M25511 Pain in right shoulder: Secondary | ICD-10-CM | POA: Diagnosis not present

## 2014-01-04 DIAGNOSIS — M25511 Pain in right shoulder: Secondary | ICD-10-CM | POA: Diagnosis not present

## 2014-01-20 ENCOUNTER — Other Ambulatory Visit: Payer: Self-pay | Admitting: Interventional Cardiology

## 2014-02-09 ENCOUNTER — Other Ambulatory Visit: Payer: Self-pay

## 2014-02-09 MED ORDER — CLOPIDOGREL BISULFATE 75 MG PO TABS: 75.0000 mg | ORAL_TABLET | Freq: Every day | ORAL | Status: DC

## 2014-03-22 ENCOUNTER — Ambulatory Visit (INDEPENDENT_AMBULATORY_CARE_PROVIDER_SITE_OTHER): Payer: Medicare Other | Admitting: Interventional Cardiology

## 2014-03-22 ENCOUNTER — Encounter: Payer: Self-pay | Admitting: Interventional Cardiology

## 2014-03-22 VITALS — BP 130/68 | HR 50 | Ht 69.0 in | Wt 202.0 lb

## 2014-03-22 DIAGNOSIS — I10 Essential (primary) hypertension: Secondary | ICD-10-CM

## 2014-03-22 DIAGNOSIS — I739 Peripheral vascular disease, unspecified: Secondary | ICD-10-CM

## 2014-03-22 MED ORDER — CLOPIDOGREL BISULFATE 75 MG PO TABS: 75.0000 mg | ORAL_TABLET | Freq: Every day | ORAL | Status: DC

## 2014-03-22 NOTE — Progress Notes (Signed)
Patient ID: Walter [name], male   DOB: [birth year], 81 y.o.   MRN: [id number] Patient ID: Walter [name], male   DOB: 1933/01/14, 81 y.o.   MRN: [id number]    [facility], [address] [place], [place]  [address] Phone: [phone] Fax:  [phone]  Date:  03/22/2014   ID:  Walter [name], DOB [birth year], MRN [id number]  PCP:  Donnie Coffin, MD      History of Present Illness: Walter [name] is a 81 y.o. male  who noted left calf cramping starting on August 29, 2012.     He had an angiogram showing severe right distal SFA disease and an occluded left popliteal artery in 9/14.  THere was bilateral tibial vessel disease as well.  He had a left fem-tib bypass in 2104. No nonhealing sores.   Diagnosed with neuropathy.  He has some burning in his legs.     He admits to dietary indiscretion,.  He tries to take healthy supplement.     Wt Readings from Last 3 Encounters:  03/22/14 202 lb (91.627 kg)  10/25/13 199 lb (90.266 kg)  09/02/13 199 lb (90.266 kg)     Past Medical History  Diagnosis Date  . Anxiety   . Hypertension   . Barrett's esophagus     "never dilated" (06/10/2013)  . Diverticulosis   . Colon polyps     tubular adenoma-last colon 12/18/2005  . Hiatal hernia   . Depression   . Hematoma     on mid back  . Hypothyroidism     takes synthroid  . GERD (gastroesophageal reflux disease)     takes omeprazole  . Peripheral vascular disease   . High cholesterol   . Arthritis     "legs and feet" (06/10/2013)    Current Outpatient Prescriptions  Medication Sig Dispense Refill  . acetaminophen (TYLENOL) 325 MG tablet Take 650 mg by mouth every 6 (six) hours as needed for mild pain, moderate pain or fever.    Marland Kitchen amLODipine (NORVASC) 5 MG tablet Take 5 mg by mouth daily.    Marland Kitchen atorvastatin (LIPITOR) 40 MG tablet Take 40 mg by mouth daily.     . clopidogrel (PLAVIX) 75 MG tablet Take 1 tablet (75 mg total) by mouth daily. 30 tablet 1  . gabapentin (NEURONTIN) 300 MG capsule  Take 300 mg by mouth at bedtime.     Marland Kitchen levothyroxine (SYNTHROID, LEVOTHROID) 125 MCG tablet Take 125 mcg by mouth daily before breakfast.    . Omega-3 Fatty Acids (OMEGA 3 PO) Take 2 capsules by mouth daily.    Marland Kitchen omeprazole (PRILOSEC) 40 MG capsule TAKE 1 CAPSULE DAILY. 90 capsule 1  . OVER THE COUNTER MEDICATION Place 2 sprays into both nostrils 2 (two) times daily. Over the counter nasal spray.    . Probiotic Product (PROBIOTIC PEARLS ADVANTAGE PO) Take 1 capsule by mouth daily.     No current facility-administered medications for this visit.    Allergies:    Allergies  Allergen Reactions  . Penicillins Other (See Comments)    Boils    Social History:  The patient  reports that he quit smoking about 28 years ago. His smoking use included Cigarettes. He has a 20 pack-year smoking history. He has never used smokeless tobacco. He reports that he does not drink alcohol or use illicit drugs.   Family History:  The patient's family history includes Alzheimer's disease in his sister; Cancer in his brother and father;  Heart disease in his mother and sister; Hypertension in his sister; Lung cancer in his brother; Melanoma in his father. There is no history of Colon cancer.   ROS:  Please see the history of present illness.  No nausea, vomiting.  No fevers, chills.  No focal weakness.  No dysuria. Claudication as noted above   All other systems reviewed and negative.   PHYSICAL EXAM: VS:  BP 130/68 mmHg  Pulse 50  Ht 5\' 9"  (1.753 m)  Wt 202 lb (91.627 kg)  BMI 29.82 kg/m2 Well nourished, well developed, in no acute distress HEENT: normal Neck: no JVD, no carotid bruits Cardiac:  normal S1, S2; RRR;  Lungs:  clear to auscultation bilaterally, no wheezing, rhonchi or rales Abd: soft, nontender, no hepatomegaly Ext: no edema, unable to palpate pedal pulses bilaterally Skin: warm and dry Neuro:   no focal abnormalities noted    ECG: Sinus bradycardia, no ST segment chnages  ASSESSMENT  AND PLAN:  Claudication: Severe distal right SFA disease which is not completely occluded. Total occlusion of the left popliteal artery which was bypassed.  THis was last checked with u/s in 10/15.  Bilateral disease in the tibial vessels. Claudication has resolved. No nonhealing sores.  No bleeding problems on Plavix.  HTN: BP controlled.    Signed, Mina Marble, MD, [facility] 03/22/2014 3:40 PM

## 2014-03-22 NOTE — Patient Instructions (Signed)
Your physician recommends that you schedule a follow-up appointment as needed with Dr. Varanasi.  

## 2014-04-05 ENCOUNTER — Telehealth: Payer: Self-pay | Admitting: Internal Medicine

## 2014-04-05 ENCOUNTER — Other Ambulatory Visit: Payer: Self-pay

## 2014-04-05 MED ORDER — CLOPIDOGREL BISULFATE 75 MG PO TABS: 75.0000 mg | ORAL_TABLET | Freq: Every day | ORAL | Status: DC

## 2014-04-05 NOTE — Telephone Encounter (Signed)
Ordered Medications       Disp Refills Start End    clopidogrel (PLAVIX) 75 MG tablet 90 tablet 3 03/22/2014     Take 1 tablet (75 mg total) by mouth daily. - Oral    Jettie Booze, MD at 03/22/2014 3:34 PM   Pt stated he wants his refills now sent to [facility]

## 2014-04-07 MED ORDER — OMEPRAZOLE 40 MG PO CPDR: 40.0000 mg | DELAYED_RELEASE_CAPSULE | Freq: Every day | ORAL | Status: DC

## 2014-04-07 NOTE — Telephone Encounter (Signed)
Sent Omeprazole to [facility] per patient's request.

## 2014-04-29 ENCOUNTER — Encounter: Payer: Self-pay | Admitting: Family

## 2014-05-02 ENCOUNTER — Ambulatory Visit (INDEPENDENT_AMBULATORY_CARE_PROVIDER_SITE_OTHER): Payer: Medicare Other | Admitting: Family

## 2014-05-02 ENCOUNTER — Ambulatory Visit (HOSPITAL_COMMUNITY)
Admission: RE | Admit: 2014-05-02 | Discharge: 2014-05-02 | Disposition: A | Discharge status: Home / Self Care | Payer: Medicare Other | Source: Ambulatory Visit | Attending: Family | Admitting: Family

## 2014-05-02 ENCOUNTER — Ambulatory Visit (INDEPENDENT_AMBULATORY_CARE_PROVIDER_SITE_OTHER)
Admission: RE | Admit: 2014-05-02 | Discharge: 2014-05-02 | Disposition: A | Discharge status: Home / Self Care | Payer: Medicare Other | Source: Ambulatory Visit | Attending: Family | Admitting: Family

## 2014-05-02 ENCOUNTER — Encounter: Payer: Self-pay | Admitting: Family

## 2014-05-02 VITALS — BP 124/62 | HR 52 | Resp 14 | Ht 69.0 in | Wt 201.0 lb

## 2014-05-02 DIAGNOSIS — Z87891 Personal history of nicotine dependence: Secondary | ICD-10-CM

## 2014-05-02 DIAGNOSIS — I739 Peripheral vascular disease, unspecified: Secondary | ICD-10-CM

## 2014-05-02 DIAGNOSIS — Z95820 Peripheral vascular angioplasty status with implants and grafts: Secondary | ICD-10-CM | POA: Diagnosis not present

## 2014-05-02 DIAGNOSIS — Z9889 Other specified postprocedural states: Secondary | ICD-10-CM | POA: Diagnosis not present

## 2014-05-02 DIAGNOSIS — E785 Hyperlipidemia, unspecified: Secondary | ICD-10-CM | POA: Diagnosis not present

## 2014-05-02 DIAGNOSIS — Z95828 Presence of other vascular implants and grafts: Secondary | ICD-10-CM

## 2014-05-02 DIAGNOSIS — I10 Essential (primary) hypertension: Secondary | ICD-10-CM | POA: Insufficient documentation

## 2014-05-02 DIAGNOSIS — Z48812 Encounter for surgical aftercare following surgery on the circulatory system: Secondary | ICD-10-CM

## 2014-05-02 NOTE — Progress Notes (Signed)
[facility] [place] HISTORY AND PHYSICAL -PAD  History of Present Illness Walter [name] is a 81 y.o. male patient of Dr. Kellie Simmering who is s/p left femoral popliteal bypass graft using non reversed greater saphenous vein on 11/27/12 by Dr. Trula Slade.  He is also s/p I&D left leg, general anesthesia, by Dr. Trula Slade on 06/10/13. He returns today for follow up. He no longer has lymphocele problems in his left thigh since the I&D. Pt states he no longer feels the generalized bad feeling.  He had therapy for right rotator cuff issue.  He walked a great deal on his job, has been retired a few years, states walking did not help his circulation since he developed the LE arterial blockages; however, with further investigation, he smoke for 20 years until 1981 then was exposed to second hand smoke in his office chronically until about 2005 in his office. Therefore he no longer walks much, no barriers to walking, states he will start on a treadmill twice/week.  He denies any history of stroke or TIA.  Pt Diabetic: No Pt smoker: former smoker, quit in 1981, then exposed to second hand smoke until about 2005 in his office.  Pt meds include: Statin :Yes Betablocker: Yes ASA: No, cannot take due to stage 3 CRF Other anticoagulants/antiplatelets: Plavix  The patient denies New Medical or Surgical History.   Past Medical History  Diagnosis Date  . Anxiety   . Hypertension   . Barrett's esophagus     "never dilated" (06/10/2013)  . Diverticulosis   . Colon polyps     tubular adenoma-last colon 12/18/2005  . Hiatal hernia   . Depression   . Hematoma     on mid back  . Hypothyroidism     takes synthroid  . GERD (gastroesophageal reflux disease)     takes omeprazole  . Peripheral vascular disease   . High cholesterol   . Arthritis     "legs and feet" (06/10/2013)    Social History History  Substance Use Topics  . Smoking status: Former Smoker -- 1.00 packs/day for 20 years     Types: Cigarettes    Quit date: 06/28/1985  . Smokeless tobacco: Never Used  . Alcohol Use: No     Comment: 06/10/2013 "drank recreationally when I did drink; stopped ~ 2010"    Family History Family History  Problem Relation Age of Onset  . Colon cancer Neg Hx   . Heart disease Mother   . Lung cancer Brother     and sister  . Cancer Brother   . Melanoma Father   . Cancer Father   . Alzheimer's disease Sister     x 2  . Heart disease Sister   . Hypertension Sister     Past Surgical History  Procedure Laterality Date  . Transurethral resection of prostate    . Tonsillectomy    . Pyloroplasty    . Polypectomy      stomach  . Colonoscopy    . Upper gastrointestinal endoscopy    . Cardiac catheterization    . Cataract extraction w/ intraocular lens  implant, bilateral Bilateral   . Eye surgery    . Femoral-popliteal bypass graft Left 11/27/2012    Procedure: BYPASS GRAFT LEFT ABOVE KNEE TO BELOW KNEE POPLITEAL ARTERY USING LEFT NONREVERSED GREATER SAPPHENOUS VEIN;  Surgeon: Serafina Mitchell, MD;  Location: [facility];  Service: Vascular;  Laterality: Left;  . I&d extremity Left 06/10/2013    leg; "  from the OR in 11/2012"  . I&d extremity Left 06/10/2013    Procedure: IRRIGATION AND DEBRIDEMENT OF LEFT UPPER LEG;  Surgeon: Serafina Mitchell, MD;  Location: [facility];  Service: Vascular;  Laterality: Left;  . Lower extremity angiogram N/A 10/01/2012    Procedure: LOWER EXTREMITY ANGIOGRAM;  Surgeon: Jettie Booze, MD;  Location: [facility] CATH LAB;  Service: Cardiovascular;  Laterality: N/A;    Allergies  Allergen Reactions  . Penicillins Other (See Comments)    Boils    Current Outpatient Prescriptions  Medication Sig Dispense Refill  . acetaminophen (TYLENOL) 325 MG tablet Take 650 mg by mouth every 6 (six) hours as needed for mild pain, moderate pain or fever.    Marland Kitchen amLODipine (NORVASC) 5 MG tablet Take 5 mg by mouth daily.    Marland Kitchen atorvastatin (LIPITOR) 40 MG tablet Take 40 mg by  mouth daily.     . clopidogrel (PLAVIX) 75 MG tablet Take 1 tablet (75 mg total) by mouth daily. 90 tablet 1  . gabapentin (NEURONTIN) 300 MG capsule Take 300 mg by mouth at bedtime.     Marland Kitchen levothyroxine (SYNTHROID, LEVOTHROID) 125 MCG tablet Take 125 mcg by mouth daily before breakfast.    . Omega-3 Fatty Acids (OMEGA 3 PO) Take 2 capsules by mouth daily.    Marland Kitchen omeprazole (PRILOSEC) 40 MG capsule Take 1 capsule (40 mg total) by mouth daily. 90 capsule 1  . OVER THE COUNTER MEDICATION Place 2 sprays into both nostrils 2 (two) times daily. Over the counter nasal spray.    . Probiotic Product (PROBIOTIC PEARLS ADVANTAGE PO) Take 1 capsule by mouth daily.     No current facility-administered medications for this visit.    ROS: See HPI for pertinent positives and negatives.   Physical Examination  Filed Vitals:   05/02/14 1403  BP: 124/62  Pulse: 52  Resp: 14  Height: 5\' 9"  (1.753 m)  Weight: 201 lb (91.173 kg)  SpO2: 98%   Body mass index is 29.67 kg/(m^2).   General: A&O x 3, WDWN. Gait: normal Eyes: no gross abnormalites Pulmonary: Respirations are non labored.  Cardiac: regular Rythm   Radial pulses: are 2+ palpable and =.   VASCULAR EXAM: Extremities without ischemic changes  without Gangrene; without open wounds.     LE Pulses LEFT RIGHT   FEMORAL 2+ palpable 1+ palpable    POPLITEAL not palpable  not palpable   POSTERIOR TIBIAL not palpable  not palpable    DORSALIS PEDIS  ANTERIOR TIBIAL 2+ palpable  not palpable    Abdomen: soft, NT, no palpable masses. Skin: no rashes, no ulcers. Musculoskeletal: no muscle wasting or atrophy. Neurologic: A&O X 3; Appropriate Affect ; SENSATION: normal; MOTOR FUNCTION: moving all extremities equally. Speech is  fluent/normal. CN 2-12 intact.         Non-Invasive Vascular Imaging: DATE: 05/02/2014 LOWER EXTREMITY ARTERIAL DUPLEX EVALUATION    INDICATION: Follow-up left lower extremity graft    PREVIOUS INTERVENTION(S): Left above knee popliteal to below knee popliteal bypass graft placed 11/27/2012.    DUPLEX EXAM:     RIGHT  LEFT   Peak Systolic Velocity (cm/s) Ratio (if abnormal) Waveform  Peak Systolic Velocity (cm/s) Ratio (if abnormal) Waveform     Inflow Artery 140  T     Proximal Anastomosis 125  T     Proximal Graft 79  T     Mid Graft 78  T      Distal Graft 75  T     Distal Anastomosis 91  T     Outflow Artery 79  T  0.69 Today's ABI / TBI 1.15  0.68 Previous ABI / TBI (10/25/2013 ) 0.86    Waveform:    M - Monophasic       B - Biphasic       T - Triphasic  If Ankle Brachial Index (ABI) or Toe Brachial Index (TBI) performed, please see complete report  ADDITIONAL FINDINGS:     IMPRESSION: Widely patent left lower extremity bypass graft without evidence of restenosis or hyperplasia.     Compared to the previous exam:  No significant change compared to prior exam.      ASSESSMENT: Walter [name] is a 81 y.o. male who is s/p left femoral popliteal bypass graft using non reversed greater saphenous vein on 11/27/12. His risk factors for PAD include former smoker until 1981 and exposure to chronic second hand smoke in his office until 2005. Fortunately he does not have DM. He is not walking much, has no claudication nor tissue loss in his LE's, has no barriers to walking. No significant change compared to prior exam.  Today's left LE arterial Duplex suggests a widely patent left lower extremity bypass graft without evidence of restenosis or hyperplasia. Right ABI remains stable with moderate arterial occlusive disease, left ABI improved from evidence of mild arterial occlusive disease to normal.  PLAN:  Graduated walking program.  I discussed in depth with the patient the  nature of atherosclerosis, and emphasized the importance of maximal medical management including strict control of blood pressure, blood glucose, and lipid levels, obtaining regular exercise, and continued cessation of smoking.  The patient is aware that without maximal medical management the underlying atherosclerotic disease process will progress, limiting the benefit of any interventions.  Based on the patient's vascular studies and examination, pt will return to clinic in 6 months for ABI's and left LE arterial Duplex according to post operative surveillance timeline.   The patient was given information about PAD including signs, symptoms, treatment, what symptoms should prompt the patient to seek immediate medical care, and risk reduction measures to take.  Clemon Chambers, RN, MSN, FNP-C [facility] Phone: [phone]  Clinic MD: Bridgett Larsson  05/02/2014 2:06 PM

## 2014-05-02 NOTE — Patient Instructions (Signed)

## 2014-05-03 NOTE — Addendum Note (Signed)
Addended by: Mena Goes on: 05/03/2014 04:30 PM   Modules accepted: Orders

## 2014-05-27 ENCOUNTER — Encounter (HOSPITAL_COMMUNITY): Payer: Self-pay | Admitting: Emergency Medicine

## 2014-05-27 ENCOUNTER — Inpatient Hospital Stay (HOSPITAL_COMMUNITY)
Admission: EM | Admit: 2014-05-27 | Discharge: 2014-05-30 | DRG: 372 | Disposition: A | Discharge status: Home / Self Care | Payer: Medicare Other | Attending: Internal Medicine | Admitting: Internal Medicine

## 2014-05-27 ENCOUNTER — Emergency Department (HOSPITAL_COMMUNITY): Payer: Medicare Other

## 2014-05-27 DIAGNOSIS — Z808 Family history of malignant neoplasm of other organs or systems: Secondary | ICD-10-CM

## 2014-05-27 DIAGNOSIS — R109 Unspecified abdominal pain: Secondary | ICD-10-CM

## 2014-05-27 DIAGNOSIS — Z880 Allergy status to penicillin: Secondary | ICD-10-CM

## 2014-05-27 DIAGNOSIS — Z66 Do not resuscitate: Secondary | ICD-10-CM | POA: Diagnosis present

## 2014-05-27 DIAGNOSIS — Z86010 Personal history of colonic polyps: Secondary | ICD-10-CM

## 2014-05-27 DIAGNOSIS — K529 Noninfective gastroenteritis and colitis, unspecified: Secondary | ICD-10-CM | POA: Diagnosis not present

## 2014-05-27 DIAGNOSIS — Z801 Family history of malignant neoplasm of trachea, bronchus and lung: Secondary | ICD-10-CM

## 2014-05-27 DIAGNOSIS — A0472 Enterocolitis due to Clostridium difficile, not specified as recurrent: Secondary | ICD-10-CM | POA: Diagnosis present

## 2014-05-27 DIAGNOSIS — Z87891 Personal history of nicotine dependence: Secondary | ICD-10-CM

## 2014-05-27 DIAGNOSIS — E039 Hypothyroidism, unspecified: Secondary | ICD-10-CM | POA: Diagnosis present

## 2014-05-27 DIAGNOSIS — M1990 Unspecified osteoarthritis, unspecified site: Secondary | ICD-10-CM | POA: Diagnosis present

## 2014-05-27 DIAGNOSIS — A047 Enterocolitis due to Clostridium difficile: Principal | ICD-10-CM | POA: Diagnosis present

## 2014-05-27 DIAGNOSIS — F419 Anxiety disorder, unspecified: Secondary | ICD-10-CM | POA: Diagnosis present

## 2014-05-27 DIAGNOSIS — I10 Essential (primary) hypertension: Secondary | ICD-10-CM | POA: Diagnosis present

## 2014-05-27 DIAGNOSIS — Z79899 Other long term (current) drug therapy: Secondary | ICD-10-CM

## 2014-05-27 DIAGNOSIS — K5730 Diverticulosis of large intestine without perforation or abscess without bleeding: Secondary | ICD-10-CM | POA: Diagnosis present

## 2014-05-27 DIAGNOSIS — F329 Major depressive disorder, single episode, unspecified: Secondary | ICD-10-CM | POA: Diagnosis present

## 2014-05-27 DIAGNOSIS — Z7902 Long term (current) use of antithrombotics/antiplatelets: Secondary | ICD-10-CM

## 2014-05-27 DIAGNOSIS — E860 Dehydration: Secondary | ICD-10-CM | POA: Diagnosis present

## 2014-05-27 DIAGNOSIS — K219 Gastro-esophageal reflux disease without esophagitis: Secondary | ICD-10-CM | POA: Diagnosis present

## 2014-05-27 DIAGNOSIS — K921 Melena: Secondary | ICD-10-CM | POA: Diagnosis present

## 2014-05-27 DIAGNOSIS — R6510 Systemic inflammatory response syndrome (SIRS) of non-infectious origin without acute organ dysfunction: Secondary | ICD-10-CM | POA: Diagnosis present

## 2014-05-27 DIAGNOSIS — I739 Peripheral vascular disease, unspecified: Secondary | ICD-10-CM | POA: Diagnosis present

## 2014-05-27 DIAGNOSIS — E780 Pure hypercholesterolemia: Secondary | ICD-10-CM | POA: Diagnosis present

## 2014-05-27 DIAGNOSIS — K2270 Barrett's esophagus without dysplasia: Secondary | ICD-10-CM | POA: Diagnosis present

## 2014-05-27 DIAGNOSIS — N179 Acute kidney failure, unspecified: Secondary | ICD-10-CM | POA: Diagnosis present

## 2014-05-27 DIAGNOSIS — Z8249 Family history of ischemic heart disease and other diseases of the circulatory system: Secondary | ICD-10-CM

## 2014-05-27 DIAGNOSIS — Z820 Family history of epilepsy and other diseases of the nervous system: Secondary | ICD-10-CM

## 2014-05-27 LAB — CBC WITH DIFFERENTIAL/PLATELET
Basophils Absolute: 0 K/uL (ref 0.0–0.1)
Basophils Relative: 0 % (ref 0–1)
Eosinophils Absolute: 0 K/uL (ref 0.0–0.7)
Eosinophils Relative: 0 % (ref 0–5)
HCT: 39.7 % (ref 39.0–52.0)
Hemoglobin: 13.2 g/dL (ref 13.0–17.0)
Lymphocytes Relative: 6 % — ABNORMAL LOW (ref 12–46)
Lymphs Abs: 0.9 K/uL (ref 0.7–4.0)
MCH: 31 pg (ref 26.0–34.0)
MCHC: 33.2 g/dL (ref 30.0–36.0)
MCV: 93.2 fL (ref 78.0–100.0)
Monocytes Absolute: 1 K/uL (ref 0.1–1.0)
Monocytes Relative: 6 % (ref 3–12)
Neutro Abs: 14.1 K/uL — ABNORMAL HIGH (ref 1.7–7.7)
Neutrophils Relative %: 88 % — ABNORMAL HIGH (ref 43–77)
Platelets: 192 K/uL (ref 150–400)
RBC: 4.26 MIL/uL (ref 4.22–5.81)
RDW: 13.1 % (ref 11.5–15.5)
WBC: 16 K/uL — ABNORMAL HIGH (ref 4.0–10.5)

## 2014-05-27 LAB — COMPREHENSIVE METABOLIC PANEL
ALT: 17 U/L (ref 17–63)
ANION GAP: 11 (ref 5–15)
AST: 22 U/L (ref 15–41)
Albumin: 4.1 g/dL (ref 3.5–5.0)
Alkaline Phosphatase: 100 U/L (ref 38–126)
BUN: 24 mg/dL — AB (ref 6–20)
CALCIUM: 9.1 mg/dL (ref 8.9–10.3)
CO2: 21 mmol/L — AB (ref 22–32)
CREATININE: 1.56 mg/dL — AB (ref 0.61–1.24)
Chloride: 107 mmol/L (ref 101–111)
GFR, EST AFRICAN AMERICAN: 47 mL/min — AB (ref >60)
GFR, EST NON AFRICAN AMERICAN: 41 mL/min — AB (ref >60)
Glucose, Bld: 149 mg/dL — ABNORMAL HIGH (ref 65–99)
Potassium: 3.6 mmol/L (ref 3.5–5.1)
Sodium: 139 mmol/L (ref 135–145)
Total Bilirubin: 0.7 mg/dL (ref 0.3–1.2)
Total Protein: 6.8 g/dL (ref 6.5–8.1)

## 2014-05-27 LAB — LIPASE, BLOOD: Lipase: 23 U/L (ref 22–51)

## 2014-05-27 MED ORDER — METRONIDAZOLE 500 MG PO TABS
500.0000 mg | ORAL_TABLET | Freq: Two times a day (BID) | ORAL | Status: DC
Start: 2014-05-27 — End: 2014-05-30

## 2014-05-27 MED ORDER — METRONIDAZOLE IN NACL 5-0.79 MG/ML-% IV SOLN
500.0000 mg | Freq: Once | INTRAVENOUS | Status: AC
  Administered 2014-05-27: 500 mg via INTRAVENOUS
  Filled 2014-05-27: qty 100

## 2014-05-27 MED ORDER — HYDROMORPHONE HCL 1 MG/ML IJ SOLN
1.0000 mg | Freq: Once | INTRAMUSCULAR | Status: AC
Start: 2014-05-27 — End: 2014-05-27
  Administered 2014-05-27: 1 mg via INTRAVENOUS
  Filled 2014-05-27: qty 1

## 2014-05-27 MED ORDER — IOHEXOL 300 MG/ML  SOLN
100.0000 mL | Freq: Once | INTRAMUSCULAR | Status: AC | PRN
  Administered 2014-05-27: 80 mL via INTRAVENOUS

## 2014-05-27 MED ORDER — LOPERAMIDE HCL 2 MG PO CAPS
2.0000 mg | ORAL_CAPSULE | ORAL | Status: DC | PRN
  Administered 2014-05-27 – 2014-05-28 (×3): 2 mg via ORAL
  Filled 2014-05-27 (×3): qty 1

## 2014-05-27 MED ORDER — CIPROFLOXACIN IN D5W 400 MG/200ML IV SOLN
400.0000 mg | Freq: Once | INTRAVENOUS | Status: DC
  Filled 2014-05-27: qty 200

## 2014-05-27 MED ORDER — SODIUM CHLORIDE 0.9 % IV SOLN
INTRAVENOUS | Status: DC
  Administered 2014-05-27 – 2014-05-30 (×5): via INTRAVENOUS

## 2014-05-27 MED ORDER — CIPROFLOXACIN HCL 500 MG PO TABS: 500.0000 mg | ORAL_TABLET | Freq: Two times a day (BID) | ORAL | Status: DC

## 2014-05-27 MED ORDER — SODIUM CHLORIDE 0.9 % IV BOLUS (SEPSIS)
1000.0000 mL | Freq: Once | INTRAVENOUS | Status: AC
  Administered 2014-05-27: 1000 mL via INTRAVENOUS

## 2014-05-27 MED ORDER — ONDANSETRON HCL 4 MG/2ML IJ SOLN
4.0000 mg | Freq: Once | INTRAMUSCULAR | Status: AC
  Administered 2014-05-27: 4 mg via INTRAVENOUS
  Filled 2014-05-27: qty 2

## 2014-05-27 MED ORDER — CIPROFLOXACIN HCL 500 MG PO TABS
500.0000 mg | ORAL_TABLET | Freq: Once | ORAL | Status: AC
  Administered 2014-05-27: 500 mg via ORAL
  Filled 2014-05-27: qty 1

## 2014-05-27 NOTE — ED Notes (Signed)
Pt is off the floor to CT

## 2014-05-27 NOTE — ED Provider Notes (Addendum)
CSN: [id number]     Arrival date & time 05/27/14  1653 History   First MD Initiated Contact with Patient 05/27/14 1701     Chief Complaint  Patient presents with  . Abdominal Pain     (Consider location/radiation/quality/duration/timing/severity/associated sxs/prior Treatment) HPI Comments: Patient here due to crampy lower abdominal pain with associated vomiting diarrhea and again this morning. Patient started a new supplement designed to cleanse his bowels. Denies any bloody stools. His emesis is nonbilious. Denies any fever. No urinary symptoms. No prior history of same. Nothing makes his symptoms better or worse. No treatment use prior to arrival  Patient is a 81 y.o. male presenting with abdominal pain. The history is provided by the patient.  Abdominal Pain   Past Medical History  Diagnosis Date  . Anxiety   . Hypertension   . Barrett's esophagus     "never dilated" (06/10/2013)  . Diverticulosis   . Colon polyps     tubular adenoma-last colon 12/18/2005  . Hiatal hernia   . Depression   . Hematoma     on mid back  . Hypothyroidism     takes synthroid  . GERD (gastroesophageal reflux disease)     takes omeprazole  . Peripheral vascular disease   . High cholesterol   . Arthritis     "legs and feet" (06/10/2013)   Past Surgical History  Procedure Laterality Date  . Transurethral resection of prostate    . Tonsillectomy    . Pyloroplasty    . Polypectomy      stomach  . Colonoscopy    . Upper gastrointestinal endoscopy    . Cardiac catheterization    . Cataract extraction w/ intraocular lens  implant, bilateral Bilateral   . Eye surgery    . Femoral-popliteal bypass graft Left 11/27/2012    Procedure: BYPASS GRAFT LEFT ABOVE KNEE TO BELOW KNEE POPLITEAL ARTERY USING LEFT NONREVERSED GREATER SAPPHENOUS VEIN;  Surgeon: Serafina Mitchell, MD;  Location: [facility];  Service: Vascular;  Laterality: Left;  . I&d extremity Left 06/10/2013    leg; "from the OR in 11/2012"  . I&d  extremity Left 06/10/2013    Procedure: IRRIGATION AND DEBRIDEMENT OF LEFT UPPER LEG;  Surgeon: Serafina Mitchell, MD;  Location: [facility];  Service: Vascular;  Laterality: Left;  . Lower extremity angiogram N/A 10/01/2012    Procedure: LOWER EXTREMITY ANGIOGRAM;  Surgeon: Jettie Booze, MD;  Location: [facility] CATH LAB;  Service: Cardiovascular;  Laterality: N/A;   Family History  Problem Relation Age of Onset  . Colon cancer Neg Hx   . Heart disease Mother   . Lung cancer Brother     and sister  . Cancer Brother   . Melanoma Father   . Cancer Father   . Alzheimer's disease Sister     x 2  . Heart disease Sister   . Hypertension Sister    History  Substance Use Topics  . Smoking status: Former Smoker -- 1.00 packs/day for 20 years    Types: Cigarettes    Quit date: 06/28/1985  . Smokeless tobacco: Never Used  . Alcohol Use: No     Comment: 06/10/2013 "drank recreationally when I did drink; stopped ~ 2010"    Review of Systems  Gastrointestinal: Positive for abdominal pain.  All other systems reviewed and are negative.     Allergies  Penicillins  Home Medications   Prior to Admission medications   Medication Sig Start Date End Date Taking? Authorizing  Provider  acetaminophen (TYLENOL) 325 MG tablet Take 650 mg by mouth every 6 (six) hours as needed for mild pain, moderate pain or fever.    Historical Provider, MD  amLODipine (NORVASC) 5 MG tablet Take 5 mg by mouth daily.    Historical Provider, MD  atorvastatin (LIPITOR) 40 MG tablet Take 40 mg by mouth daily.  05/25/13   Historical Provider, MD  clopidogrel (PLAVIX) 75 MG tablet Take 1 tablet (75 mg total) by mouth daily. 04/05/14   Jettie Booze, MD  gabapentin (NEURONTIN) 300 MG capsule Take 300 mg by mouth at bedtime.  03/24/13   Historical Provider, MD  levothyroxine (SYNTHROID, LEVOTHROID) 125 MCG tablet Take 125 mcg by mouth daily before breakfast.    Historical Provider, MD  Omega-3 Fatty Acids (OMEGA 3 PO) Take 2  capsules by mouth daily.    Historical Provider, MD  omeprazole (PRILOSEC) 40 MG capsule Take 1 capsule (40 mg total) by mouth daily. 04/07/14   Irene Shipper, MD  OVER THE COUNTER MEDICATION Place 2 sprays into both nostrils 2 (two) times daily. Over the counter nasal spray.    Historical Provider, MD  Probiotic Product (PROBIOTIC PEARLS ADVANTAGE PO) Take 1 capsule by mouth daily.    Historical Provider, MD   There were no vitals taken for this visit. Physical Exam  Constitutional: He is oriented to person, place, and time. He appears well-developed and well-nourished.  Non-toxic appearance. No distress.  HENT:  Head: Normocephalic and atraumatic.  Eyes: Conjunctivae, EOM and lids are normal. Pupils are equal, round, and reactive to light.  Neck: Normal range of motion. Neck supple. No tracheal deviation present. No thyroid mass present.  Cardiovascular: Normal rate, regular rhythm and normal heart sounds.  Exam reveals no gallop.   No murmur heard. Pulmonary/Chest: Effort normal and breath sounds normal. No stridor. No respiratory distress. He has no decreased breath sounds. He has no wheezes. He has no rhonchi. He has no rales.  Abdominal: Soft. Normal appearance and bowel sounds are normal. He exhibits no distension. There is tenderness in the right lower quadrant and left lower quadrant. There is no rigidity, no rebound, no guarding and no CVA tenderness.    Musculoskeletal: Normal range of motion. He exhibits no edema or tenderness.  Neurological: He is alert and oriented to person, place, and time. He has normal strength. No cranial nerve deficit or sensory deficit. GCS eye subscore is 4. GCS verbal subscore is 5. GCS motor subscore is 6.  Skin: Skin is warm and dry. No abrasion and no rash noted.  Psychiatric: He has a normal mood and affect. His speech is normal and behavior is normal.  Nursing note and vitals reviewed.   ED Course  Procedures (including critical care time) Labs  Review Labs Reviewed  CBC WITH DIFFERENTIAL/PLATELET  COMPREHENSIVE METABOLIC PANEL  LIPASE, BLOOD    Imaging Review No results found.   EKG Interpretation None      MDM   Final diagnoses:  Abdominal pain    Patient given dose of IV Cipro and Flagyl. CT scan shows colitis. We'll discharge on same  10:31 PM Attempted to discharge the patient but then he became sick and start having vomiting and diarrhea. We'll admit to medicine   Lacretia Leigh, MD 05/27/14 2020  Lacretia Leigh, MD 05/27/14 2237

## 2014-05-27 NOTE — ED Notes (Signed)
Per EMS: Pt states that he started a new meal replacement supplement last night.  This morning, he ate a muffin and coffee, as per his norm.  Then 1300 today, he started having NVD and low abd pain.

## 2014-05-27 NOTE — ED Notes (Signed)
Nurse starting IV 

## 2014-05-27 NOTE — ED Notes (Signed)
Report received from previous RN, Anderson Malta.

## 2014-05-28 ENCOUNTER — Encounter (HOSPITAL_COMMUNITY): Payer: Self-pay | Admitting: Internal Medicine

## 2014-05-28 DIAGNOSIS — F419 Anxiety disorder, unspecified: Secondary | ICD-10-CM | POA: Diagnosis present

## 2014-05-28 DIAGNOSIS — K921 Melena: Secondary | ICD-10-CM | POA: Diagnosis present

## 2014-05-28 DIAGNOSIS — Z808 Family history of malignant neoplasm of other organs or systems: Secondary | ICD-10-CM | POA: Diagnosis not present

## 2014-05-28 DIAGNOSIS — A0472 Enterocolitis due to Clostridium difficile, not specified as recurrent: Secondary | ICD-10-CM | POA: Diagnosis present

## 2014-05-28 DIAGNOSIS — K529 Noninfective gastroenteritis and colitis, unspecified: Secondary | ICD-10-CM | POA: Diagnosis not present

## 2014-05-28 DIAGNOSIS — F329 Major depressive disorder, single episode, unspecified: Secondary | ICD-10-CM | POA: Diagnosis present

## 2014-05-28 DIAGNOSIS — Z8249 Family history of ischemic heart disease and other diseases of the circulatory system: Secondary | ICD-10-CM | POA: Diagnosis not present

## 2014-05-28 DIAGNOSIS — A047 Enterocolitis due to Clostridium difficile: Principal | ICD-10-CM

## 2014-05-28 DIAGNOSIS — K219 Gastro-esophageal reflux disease without esophagitis: Secondary | ICD-10-CM | POA: Diagnosis present

## 2014-05-28 DIAGNOSIS — K5730 Diverticulosis of large intestine without perforation or abscess without bleeding: Secondary | ICD-10-CM | POA: Diagnosis present

## 2014-05-28 DIAGNOSIS — I10 Essential (primary) hypertension: Secondary | ICD-10-CM | POA: Diagnosis not present

## 2014-05-28 DIAGNOSIS — E860 Dehydration: Secondary | ICD-10-CM | POA: Diagnosis not present

## 2014-05-28 DIAGNOSIS — N179 Acute kidney failure, unspecified: Secondary | ICD-10-CM | POA: Diagnosis not present

## 2014-05-28 DIAGNOSIS — E039 Hypothyroidism, unspecified: Secondary | ICD-10-CM | POA: Diagnosis present

## 2014-05-28 DIAGNOSIS — Z86010 Personal history of colonic polyps: Secondary | ICD-10-CM | POA: Diagnosis not present

## 2014-05-28 DIAGNOSIS — Z7902 Long term (current) use of antithrombotics/antiplatelets: Secondary | ICD-10-CM | POA: Diagnosis not present

## 2014-05-28 DIAGNOSIS — M1990 Unspecified osteoarthritis, unspecified site: Secondary | ICD-10-CM | POA: Diagnosis present

## 2014-05-28 DIAGNOSIS — Z79899 Other long term (current) drug therapy: Secondary | ICD-10-CM | POA: Diagnosis not present

## 2014-05-28 DIAGNOSIS — I739 Peripheral vascular disease, unspecified: Secondary | ICD-10-CM | POA: Diagnosis present

## 2014-05-28 DIAGNOSIS — R1030 Lower abdominal pain, unspecified: Secondary | ICD-10-CM | POA: Diagnosis not present

## 2014-05-28 DIAGNOSIS — Z66 Do not resuscitate: Secondary | ICD-10-CM | POA: Diagnosis present

## 2014-05-28 DIAGNOSIS — E780 Pure hypercholesterolemia: Secondary | ICD-10-CM | POA: Diagnosis present

## 2014-05-28 DIAGNOSIS — Z880 Allergy status to penicillin: Secondary | ICD-10-CM | POA: Diagnosis not present

## 2014-05-28 DIAGNOSIS — R109 Unspecified abdominal pain: Secondary | ICD-10-CM | POA: Diagnosis present

## 2014-05-28 DIAGNOSIS — Z87891 Personal history of nicotine dependence: Secondary | ICD-10-CM | POA: Diagnosis not present

## 2014-05-28 DIAGNOSIS — K2270 Barrett's esophagus without dysplasia: Secondary | ICD-10-CM | POA: Diagnosis present

## 2014-05-28 DIAGNOSIS — Z801 Family history of malignant neoplasm of trachea, bronchus and lung: Secondary | ICD-10-CM | POA: Diagnosis not present

## 2014-05-28 DIAGNOSIS — Z820 Family history of epilepsy and other diseases of the nervous system: Secondary | ICD-10-CM | POA: Diagnosis not present

## 2014-05-28 DIAGNOSIS — A419 Sepsis, unspecified organism: Secondary | ICD-10-CM | POA: Diagnosis not present

## 2014-05-28 LAB — COMPREHENSIVE METABOLIC PANEL
ALK PHOS: 76 U/L (ref 38–126)
ALT: 13 U/L — AB (ref 17–63)
AST: 18 U/L (ref 15–41)
Albumin: 3.2 g/dL — ABNORMAL LOW (ref 3.5–5.0)
Anion gap: 9 (ref 5–15)
BUN: 25 mg/dL — ABNORMAL HIGH (ref 6–20)
CHLORIDE: 106 mmol/L (ref 101–111)
CO2: 24 mmol/L (ref 22–32)
Calcium: 8.7 mg/dL — ABNORMAL LOW (ref 8.9–10.3)
Creatinine, Ser: 1.32 mg/dL — ABNORMAL HIGH (ref 0.61–1.24)
GFR calc Af Amer: 58 mL/min — ABNORMAL LOW (ref >60)
GFR, EST NON AFRICAN AMERICAN: 50 mL/min — AB (ref >60)
GLUCOSE: 116 mg/dL — AB (ref 65–99)
POTASSIUM: 4 mmol/L (ref 3.5–5.1)
SODIUM: 139 mmol/L (ref 135–145)
Total Bilirubin: 0.6 mg/dL (ref 0.3–1.2)
Total Protein: 5.9 g/dL — ABNORMAL LOW (ref 6.5–8.1)

## 2014-05-28 LAB — LACTIC ACID, PLASMA: LACTIC ACID, VENOUS: 2 mmol/L (ref 0.5–2.0)

## 2014-05-28 LAB — CBC
HEMATOCRIT: 36 % — AB (ref 39.0–52.0)
HEMATOCRIT: 39.3 % (ref 39.0–52.0)
Hemoglobin: 13.2 g/dL (ref 13.0–17.0)
Hemoglobin: 11.9 g/dL — ABNORMAL LOW (ref 13.0–17.0)
MCH: 31.3 pg (ref 26.0–34.0)
MCH: 30.8 pg (ref 26.0–34.0)
MCHC: 33.1 g/dL (ref 30.0–36.0)
MCHC: 33.6 g/dL (ref 30.0–36.0)
MCV: 93.1 fL (ref 78.0–100.0)
MCV: 93.3 fL (ref 78.0–100.0)
PLATELETS: 207 10*3/uL (ref 150–400)
Platelets: 191 10*3/uL (ref 150–400)
RBC: 4.22 MIL/uL (ref 4.22–5.81)
RBC: 3.86 MIL/uL — ABNORMAL LOW (ref 4.22–5.81)
RDW: 13.3 % (ref 11.5–15.5)
RDW: 13.6 % (ref 11.5–15.5)
WBC: 12.1 10*3/uL — ABNORMAL HIGH (ref 4.0–10.5)
WBC: 10.1 10*3/uL (ref 4.0–10.5)

## 2014-05-28 LAB — MRSA PCR SCREENING: MRSA BY PCR: NEGATIVE

## 2014-05-28 LAB — MAGNESIUM: Magnesium: 1.8 mg/dL (ref 1.7–2.4)

## 2014-05-28 LAB — URINALYSIS, ROUTINE W REFLEX MICROSCOPIC
Bilirubin Urine: NEGATIVE
GLUCOSE, UA: NEGATIVE mg/dL
HGB URINE DIPSTICK: NEGATIVE
KETONES UR: NEGATIVE mg/dL
LEUKOCYTES UA: NEGATIVE
Nitrite: NEGATIVE
Protein, ur: NEGATIVE mg/dL
Specific Gravity, Urine: >1.046 — ABNORMAL HIGH (ref 1.005–1.030)
UROBILINOGEN UA: 0.2 mg/dL (ref 0.0–1.0)
pH: 5 (ref 5.0–8.0)

## 2014-05-28 LAB — TSH: TSH: 0.747 u[IU]/mL (ref 0.350–4.500)

## 2014-05-28 LAB — TYPE AND SCREEN
ABO/RH(D): O POS
Antibody Screen: NEGATIVE

## 2014-05-28 LAB — PHOSPHORUS: Phosphorus: 3.6 mg/dL (ref 2.5–4.6)

## 2014-05-28 LAB — CLOSTRIDIUM DIFFICILE BY PCR: Toxigenic C. Difficile by PCR: POSITIVE — AB

## 2014-05-28 LAB — ABO/RH: ABO/RH(D): O POS

## 2014-05-28 MED ORDER — ACETAMINOPHEN 325 MG PO TABS: 650.0000 mg | ORAL_TABLET | Freq: Four times a day (QID) | ORAL | Status: DC | PRN

## 2014-05-28 MED ORDER — ATORVASTATIN CALCIUM 40 MG PO TABS
40.0000 mg | ORAL_TABLET | Freq: Every day | ORAL | Status: DC
  Administered 2014-05-28 – 2014-05-29 (×2): 40 mg via ORAL
  Filled 2014-05-28 (×3): qty 1

## 2014-05-28 MED ORDER — ONDANSETRON HCL 4 MG/2ML IJ SOLN: 4.0000 mg | Freq: Four times a day (QID) | INTRAMUSCULAR | Status: DC | PRN

## 2014-05-28 MED ORDER — SODIUM CHLORIDE 0.9 % IJ SOLN: 3.0000 mL | Freq: Two times a day (BID) | INTRAMUSCULAR | Status: DC

## 2014-05-28 MED ORDER — LEVOTHYROXINE SODIUM 25 MCG PO TABS
125.0000 ug | ORAL_TABLET | Freq: Every day | ORAL | Status: DC
  Administered 2014-05-28 – 2014-05-29 (×2): 125 ug via ORAL
  Filled 2014-05-28 (×7): qty 1

## 2014-05-28 MED ORDER — ONDANSETRON HCL 4 MG PO TABS: 4.0000 mg | ORAL_TABLET | Freq: Four times a day (QID) | ORAL | Status: DC | PRN

## 2014-05-28 MED ORDER — CIPROFLOXACIN IN D5W 400 MG/200ML IV SOLN
400.0000 mg | Freq: Two times a day (BID) | INTRAVENOUS | Status: DC
  Administered 2014-05-28 (×2): 400 mg via INTRAVENOUS
  Filled 2014-05-28 (×2): qty 200

## 2014-05-28 MED ORDER — METRONIDAZOLE IN NACL 5-0.79 MG/ML-% IV SOLN
500.0000 mg | Freq: Three times a day (TID) | INTRAVENOUS | Status: DC
  Administered 2014-05-28 – 2014-05-30 (×7): 500 mg via INTRAVENOUS
  Filled 2014-05-28 (×7): qty 100

## 2014-05-28 MED ORDER — METRONIDAZOLE IN NACL 5-0.79 MG/ML-% IV SOLN: 500.0000 mg | Freq: Three times a day (TID) | INTRAVENOUS | Status: DC

## 2014-05-28 MED ORDER — HYDROCODONE-ACETAMINOPHEN 5-325 MG PO TABS: 1.0000 | ORAL_TABLET | ORAL | Status: DC | PRN

## 2014-05-28 MED ORDER — PANTOPRAZOLE SODIUM 40 MG PO TBEC
40.0000 mg | DELAYED_RELEASE_TABLET | Freq: Every day | ORAL | Status: DC
  Administered 2014-05-28: 40 mg via ORAL
  Filled 2014-05-28: qty 1

## 2014-05-28 MED ORDER — ACETAMINOPHEN 650 MG RE SUPP: 650.0000 mg | Freq: Four times a day (QID) | RECTAL | Status: DC | PRN

## 2014-05-28 MED ORDER — HYDROMORPHONE HCL 1 MG/ML IJ SOLN
1.0000 mg | INTRAMUSCULAR | Status: DC | PRN
  Administered 2014-05-28 – 2014-05-29 (×4): 1 mg via INTRAVENOUS
  Filled 2014-05-28 (×5): qty 1

## 2014-05-28 NOTE — Consult Note (Signed)
Consult Note for [facility]  Reason for Consult: Colitis and bloody diarrhea Referring Physician: [facility]  Parke Poisson HPI: This is a 81 year old male with a PMH of C7M8 Barrett's esophagus with a history of LGD, HTN, diverticula, colonic polyps (12/18/2005), and GERD admitted with acute abdominal pain and diarrhea.  On Thursday he self-started on a dietary supplement called Toma Copier.  He took 5 capsules on Thursday and then on Friday he started to have issues with lower abdominal cramping followed by diarrhea.  His symptoms progressed to the point that he required hospitalization.  A CT scan in the ER revealed a left sided colitis as well as mild inflammation in the distal TI, although this may be as a result of the adjacent left sided colitis.  He denies any recent antibiotic use, new medications outside of [facility], or sick contacts.  His last colonoscopy appears to be on 12/18/2005 with findings of a tubular adenoma and diverticula.  In the ER he was having continuous diarrhea and there was associated blood.  Since his admission to the floor his pain and diarrhea have subsided with pain medication.  He has a low grade elevation in his temp at 99 and his WBC is at 16.  Past Medical History  Diagnosis Date  . Anxiety   . Hypertension   . Barrett's esophagus     "never dilated" (06/10/2013)  . Diverticulosis   . Colon polyps     tubular adenoma-last colon 12/18/2005  . Hiatal hernia   . Depression   . Hematoma     on mid back  . Hypothyroidism     takes synthroid  . GERD (gastroesophageal reflux disease)     takes omeprazole  . Peripheral vascular disease   . High cholesterol   . Arthritis     "legs and feet" (06/10/2013)    Past Surgical History  Procedure Laterality Date  . Transurethral resection of prostate    . Tonsillectomy    . Pyloroplasty    . Polypectomy      stomach  . Colonoscopy    . Upper gastrointestinal endoscopy    . Cardiac catheterization     . Cataract extraction w/ intraocular lens  implant, bilateral Bilateral   . Eye surgery    . Femoral-popliteal bypass graft Left 11/27/2012    Procedure: BYPASS GRAFT LEFT ABOVE KNEE TO BELOW KNEE POPLITEAL ARTERY USING LEFT NONREVERSED GREATER SAPPHENOUS VEIN;  Surgeon: Serafina Mitchell, MD;  Location: [facility];  Service: Vascular;  Laterality: Left;  . I&d extremity Left 06/10/2013    leg; "from the OR in 11/2012"  . I&d extremity Left 06/10/2013    Procedure: IRRIGATION AND DEBRIDEMENT OF LEFT UPPER LEG;  Surgeon: Serafina Mitchell, MD;  Location: [facility];  Service: Vascular;  Laterality: Left;  . Lower extremity angiogram N/A 10/01/2012    Procedure: LOWER EXTREMITY ANGIOGRAM;  Surgeon: Jettie Booze, MD;  Location: [facility] CATH LAB;  Service: Cardiovascular;  Laterality: N/A;    Family History  Problem Relation Age of Onset  . Colon cancer Neg Hx   . Heart disease Mother   . Lung cancer Brother     and sister  . Cancer Brother   . Melanoma Father   . Cancer Father   . Alzheimer's disease Sister     x 2  . Heart disease Sister   . Hypertension Sister     Social History:  reports that he quit smoking about  28 years ago. His smoking use included Cigarettes. He has a 20 pack-year smoking history. He has never used smokeless tobacco. He reports that he does not drink alcohol or use illicit drugs.  Allergies:  Allergies  Allergen Reactions  . Penicillins Other (See Comments)    Boils    Medications:  Scheduled: . atorvastatin  40 mg Oral QHS  . ciprofloxacin  400 mg Intravenous Q12H  . levothyroxine  125 mcg Oral QHS  . metronidazole  500 mg Intravenous Q8H  . pantoprazole  40 mg Oral Daily  . sodium chloride  3 mL Intravenous Q12H   Continuous: . sodium chloride 125 mL/hr at 05/28/14 1048    Results for orders placed or performed during the hospital encounter of 05/27/14 (from the past 24 hour(s))  CBC with Differential/Platelet     Status: Abnormal   Collection Time:  05/27/14  6:03 PM  Result Value Ref Range   WBC 16.0 (H) 4.0 - 10.5 K/uL   RBC 4.26 4.22 - 5.81 MIL/uL   Hemoglobin 13.2 13.0 - 17.0 g/dL   HCT 39.7 39.0 - 52.0 %   MCV 93.2 78.0 - 100.0 fL   MCH 31.0 26.0 - 34.0 pg   MCHC 33.2 30.0 - 36.0 g/dL   RDW 13.1 11.5 - 15.5 %   Platelets 192 150 - 400 K/uL   Neutrophils Relative % 88 (H) 43 - 77 %   Neutro Abs 14.1 (H) 1.7 - 7.7 K/uL   Lymphocytes Relative 6 (L) 12 - 46 %   Lymphs Abs 0.9 0.7 - 4.0 K/uL   Monocytes Relative 6 3 - 12 %   Monocytes Absolute 1.0 0.1 - 1.0 K/uL   Eosinophils Relative 0 0 - 5 %   Eosinophils Absolute 0.0 0.0 - 0.7 K/uL   Basophils Relative 0 0 - 1 %   Basophils Absolute 0.0 0.0 - 0.1 K/uL  Comprehensive metabolic panel     Status: Abnormal   Collection Time: 05/27/14  6:03 PM  Result Value Ref Range   Sodium 139 135 - 145 mmol/L   Potassium 3.6 3.5 - 5.1 mmol/L   Chloride 107 101 - 111 mmol/L   CO2 21 (L) 22 - 32 mmol/L   Glucose, Bld 149 (H) 65 - 99 mg/dL   BUN 24 (H) 6 - 20 mg/dL   Creatinine, Ser 1.56 (H) 0.61 - 1.24 mg/dL   Calcium 9.1 8.9 - 10.3 mg/dL   Total Protein 6.8 6.5 - 8.1 g/dL   Albumin 4.1 3.5 - 5.0 g/dL   AST 22 15 - 41 U/L   ALT 17 17 - 63 U/L   Alkaline Phosphatase 100 38 - 126 U/L   Total Bilirubin 0.7 0.3 - 1.2 mg/dL   GFR calc non Af Amer 41 (L) >60 mL/min   GFR calc Af Amer 47 (L) >60 mL/min   Anion gap 11 5 - 15  Lipase, blood     Status: None   Collection Time: 05/27/14  6:03 PM  Result Value Ref Range   Lipase 23 22 - 51 U/L  Lactic acid, plasma     Status: None   Collection Time: 05/28/14 12:46 AM  Result Value Ref Range   Lactic Acid, Venous 2.0 0.5 - 2.0 mmol/L  Type and screen     Status: None   Collection Time: 05/28/14 12:51 AM  Result Value Ref Range   ABO/RH(D) O POS    Antibody Screen NEG    Sample Expiration 05/31/2014  CBC     Status: Abnormal   Collection Time: 05/28/14 12:51 AM  Result Value Ref Range   WBC 12.1 (H) 4.0 - 10.5 K/uL   RBC 4.22 4.22  - 5.81 MIL/uL   Hemoglobin 13.2 13.0 - 17.0 g/dL   HCT 39.3 39.0 - 52.0 %   MCV 93.1 78.0 - 100.0 fL   MCH 31.3 26.0 - 34.0 pg   MCHC 33.6 30.0 - 36.0 g/dL   RDW 13.3 11.5 - 15.5 %   Platelets 207 150 - 400 K/uL  ABO/Rh     Status: None   Collection Time: 05/28/14 12:51 AM  Result Value Ref Range   ABO/RH(D) O POS   MRSA PCR Screening     Status: None   Collection Time: 05/28/14  3:37 AM  Result Value Ref Range   MRSA by PCR NEGATIVE NEGATIVE  Magnesium     Status: None   Collection Time: 05/28/14  5:53 AM  Result Value Ref Range   Magnesium 1.8 1.7 - 2.4 mg/dL  Phosphorus     Status: None   Collection Time: 05/28/14  5:53 AM  Result Value Ref Range   Phosphorus 3.6 2.5 - 4.6 mg/dL  TSH     Status: None   Collection Time: 05/28/14  5:53 AM  Result Value Ref Range   TSH 0.747 0.350 - 4.500 uIU/mL  Comprehensive metabolic panel     Status: Abnormal   Collection Time: 05/28/14  5:53 AM  Result Value Ref Range   Sodium 139 135 - 145 mmol/L   Potassium 4.0 3.5 - 5.1 mmol/L   Chloride 106 101 - 111 mmol/L   CO2 24 22 - 32 mmol/L   Glucose, Bld 116 (H) 65 - 99 mg/dL   BUN 25 (H) 6 - 20 mg/dL   Creatinine, Ser 1.32 (H) 0.61 - 1.24 mg/dL   Calcium 8.7 (L) 8.9 - 10.3 mg/dL   Total Protein 5.9 (L) 6.5 - 8.1 g/dL   Albumin 3.2 (L) 3.5 - 5.0 g/dL   AST 18 15 - 41 U/L   ALT 13 (L) 17 - 63 U/L   Alkaline Phosphatase 76 38 - 126 U/L   Total Bilirubin 0.6 0.3 - 1.2 mg/dL   GFR calc non Af Amer 50 (L) >60 mL/min   GFR calc Af Amer 58 (L) >60 mL/min   Anion gap 9 5 - 15  CBC     Status: Abnormal   Collection Time: 05/28/14  5:53 AM  Result Value Ref Range   WBC 10.1 4.0 - 10.5 K/uL   RBC 3.86 (L) 4.22 - 5.81 MIL/uL   Hemoglobin 11.9 (L) 13.0 - 17.0 g/dL   HCT 36.0 (L) 39.0 - 52.0 %   MCV 93.3 78.0 - 100.0 fL   MCH 30.8 26.0 - 34.0 pg   MCHC 33.1 30.0 - 36.0 g/dL   RDW 13.6 11.5 - 15.5 %   Platelets 191 150 - 400 K/uL  Urinalysis, Routine w reflex microscopic     Status:  Abnormal   Collection Time: 05/28/14  7:16 AM  Result Value Ref Range   Color, Urine YELLOW YELLOW   APPearance CLEAR CLEAR   Specific Gravity, Urine >1.046 (H) 1.005 - 1.030   pH 5.0 5.0 - 8.0   Glucose, UA NEGATIVE NEGATIVE mg/dL   Hgb urine dipstick NEGATIVE NEGATIVE   Bilirubin Urine NEGATIVE NEGATIVE   Ketones, ur NEGATIVE NEGATIVE mg/dL   Protein, ur NEGATIVE NEGATIVE mg/dL   Urobilinogen, UA  0.2 0.0 - 1.0 mg/dL   Nitrite NEGATIVE NEGATIVE   Leukocytes, UA NEGATIVE NEGATIVE     Ct Abdomen Pelvis W Contrast  05/27/2014   CLINICAL DATA:  Patient recently started male replacement sub limit. Now with nausea, vomiting and diarrhea as well as lower abdominal pain.  EXAM: CT ABDOMEN AND PELVIS WITH CONTRAST  TECHNIQUE: Multidetector CT imaging of the abdomen and pelvis was performed using the standard protocol following bolus administration of intravenous contrast.  CONTRAST:  20mL OMNIPAQUE IOHEXOL 300 MG/ML  SOLN  COMPARISON:  CT abdomen pelvis 05/08/2009  FINDINGS: Lower chest: Minimal dependent atelectasis right greater than left lower lobes. Normal heart size.  Hepatobiliary: No significant interval change in scattered hypodensities within liver. Liver is normal in size and contour. Gallbladder is unremarkable. No intrahepatic or extrahepatic biliary ductal dilatation  Pancreas: Unremarkable  Spleen:  Unremarkable.  Adrenals/Urinary Tract: Normal adrenal glands. Kidneys enhance symmetrically with contrast. No significant interval change in multiple bilateral renal hypodensities, favored to represent cysts. Many are too small to characterize. No hydronephrosis. Urinary bladder is unremarkable.  Stomach/Bowel: Extensive sigmoid colonic diverticulosis. There is wall thickening of descending and sigmoid colon with pericolonic fat stranding and small amount of fluid within left paracolic gutter. Additionally there is mild wall thickening of the distal ileum with associated mucosal hyperenhancement.  No evidence for bowel obstruction. No free intraperitoneal air. Oral contrast within the distal esophagus.  Vascular/Lymphatic: There is mild tortuosity of the abdominal aorta with peripheral calcified atherosclerotic plaque. No retroperitoneal lymphadenopathy.  Other: Fat containing right inguinal hernia.  Musculoskeletal: Lower lumbar spine degenerative changes without aggressive or acute appearing osseous lesions.  IMPRESSION: There is wall thickening and pericolonic fat stranding involving the sigmoid and distal descending colon, most concerning for acute colitis, given the longer segment of distribution. Considerations include infectious, inflammatory ischemic etiologies.  There is mild wall thickening and associated mucosal hyper-enhancement involving the distal ileum which may represent a concomitant enteritis or potentially inflammatory change associated with the adjacent colitis.  Oral contrast in the distal esophagus suggestive of reflux.   Electronically Signed   By: Lovey Newcomer M.D.   On: 05/27/2014 19:53    ROS:  As stated above in the HPI otherwise negative.  Blood pressure 132/48, pulse 72, temperature 99.3 F (37.4 C), temperature source Oral, resp. rate 16, SpO2 95 %.    PE: Gen: NAD, Alert and Oriented HEENT:  [place]/AT, EOMI Neck: Supple, no LAD Lungs: CTA Bilaterally CV: RRR without M/G/R ABM: Soft, tender in the lower abdomen, +BS Ext: No C/C/E  Assessment/Plan: 1) Colitis. 2) ? Enteritis. 3) Diverticulosis. 4) Leukocytosis.   I looked at the [employer].  It is essentially a multivitamin.  I doubt his symptoms are as a result of this supplement.  He is feeling better with pain medication and IV hydration.  I am suspicious he may have an ischemic colitis, but infection is a high consideration, especially with the possibility of an enteritis.  Plan: 1) Continue with Cipro/Flagyl. 2) If no improvement in his symptoms, a FFS versus colonoscopy can be  pursued.  Jashua Knaak D 05/28/2014, 10:48 AM

## 2014-05-28 NOTE — Progress Notes (Signed)
CRITICAL VALUE ALERT  Critical value received:  C-diff PCR is positve   Date of notification:  05/28/2014   Time of notification:  9774  Critical value read back: yes  Nurse who received alert:  Arlie Solomons  MD notified (1st page):  Dr. Clementeen Graham  Time of first page: 12:53 PM   MD notified (2nd page):  Time of second page:  Responding MD:  Dr. Clementeen Graham   Time MD responded:  N/a  MD made med changes on MAR.

## 2014-05-28 NOTE — ED Notes (Signed)
Pt stated he had to go to the bathroom, but was only able to make three steps away form his bed before dark brown, loose stool started to run down his legs.  The pts stool appeared to have blood in it.  I informed the nurse about the blood and how loose the pts stool was.

## 2014-05-28 NOTE — H&P (Signed)
PCP: Donnie Coffin, MD  Cardiology [facility]  Referring provider Zenia Resides   Chief Complaint:  diarrhea  HPI: Walter [name] is a 81 y.o. male   has a past medical history of Anxiety; Hypertension; Barrett's esophagus; Diverticulosis; Colon polyps; Hiatal hernia; Depression; Hematoma; Hypothyroidism; GERD (gastroesophageal reflux disease); Peripheral vascular disease; High cholesterol; and Arthritis.   Presented with  Patient normal BM at 1pm followed by diarrhea and worsening abdominal pain. Denies any fever but had some chills, reports some nasuea and vomiting. No chest pain. Patient ordered on line Sun Chlorella and took first dose yesterday. He presented to ER CT showed colitis. Patietn was given cipro and flagyl but was not able to tolerate PO.  Patietn has hx of PVD sp revascularization in left leg on daily Plavix.  Hospitalist was called for admission for Colitis  Review of Systems:    Pertinent positives include: chills, abdominal pain, nausea, vomiting, diarrhea, blood in stool,   Constitutional:  No weight loss, night sweats, Fevers,  fatigue, weight loss  HEENT:  No headaches, Difficulty swallowing,Tooth/dental problems,Sore throat,  No sneezing, itching, ear ache, nasal congestion, post nasal drip,  Cardio-vascular:  No chest pain, Orthopnea, PND, anasarca, dizziness, palpitations.no Bilateral lower extremity swelling  GI:  No heartburn, indigestion, change in bowel habits, loss of appetite, melenahematemesis Resp:  no shortness of breath at rest. No dyspnea on exertion, No excess mucus, no productive cough, No non-productive cough, No coughing up of blood.No change in color of mucus.No wheezing. Skin:  no rash or lesions. No jaundice GU:  no dysuria, change in color of urine, no urgency or frequency. No straining to urinate.  No flank pain.  Musculoskeletal:  No joint pain or no joint swelling. No decreased range of motion. No back  pain.  Psych:  No change in mood or affect. No depression or anxiety. No memory loss.  Neuro: no localizing neurological complaints, no tingling, no weakness, no double vision, no gait abnormality, no slurred speech, no confusion  Otherwise ROS are negative except for above, 10 systems were reviewed  Past Medical History: Past Medical History  Diagnosis Date  . Anxiety   . Hypertension   . Barrett's esophagus     "never dilated" (06/10/2013)  . Diverticulosis   . Colon polyps     tubular adenoma-last colon 12/18/2005  . Hiatal hernia   . Depression   . Hematoma     on mid back  . Hypothyroidism     takes synthroid  . GERD (gastroesophageal reflux disease)     takes omeprazole  . Peripheral vascular disease   . High cholesterol   . Arthritis     "legs and feet" (06/10/2013)   Past Surgical History  Procedure Laterality Date  . Transurethral resection of prostate    . Tonsillectomy    . Pyloroplasty    . Polypectomy      stomach  . Colonoscopy    . Upper gastrointestinal endoscopy    . Cardiac catheterization    . Cataract extraction w/ intraocular lens  implant, bilateral Bilateral   . Eye surgery    . Femoral-popliteal bypass graft Left 11/27/2012    Procedure: BYPASS GRAFT LEFT ABOVE KNEE TO BELOW KNEE POPLITEAL ARTERY USING LEFT NONREVERSED GREATER SAPPHENOUS VEIN;  Surgeon: Serafina Mitchell, MD;  Location: [facility];  Service: Vascular;  Laterality: Left;  . I&d extremity Left 06/10/2013    leg; "from the OR in 11/2012"  . I&d  extremity Left 06/10/2013    Procedure: IRRIGATION AND DEBRIDEMENT OF LEFT UPPER LEG;  Surgeon: Serafina Mitchell, MD;  Location: [facility];  Service: Vascular;  Laterality: Left;  . Lower extremity angiogram N/A 10/01/2012    Procedure: LOWER EXTREMITY ANGIOGRAM;  Surgeon: Jettie Booze, MD;  Location: [facility] CATH LAB;  Service: Cardiovascular;  Laterality: N/A;     Medications: Prior to Admission medications   Medication Sig Start Date End Date Taking?  Authorizing Provider  acetaminophen (TYLENOL) 325 MG tablet Take 650 mg by mouth at bedtime.    Yes Historical Provider, MD  amLODipine (NORVASC) 5 MG tablet Take 5 mg by mouth at bedtime.    Yes Historical Provider, MD  atorvastatin (LIPITOR) 40 MG tablet Take 40 mg by mouth at bedtime.  05/25/13  Yes Historical Provider, MD  clopidogrel (PLAVIX) 75 MG tablet Take 1 tablet (75 mg total) by mouth daily. 04/05/14  Yes Jettie Booze, MD  levothyroxine (SYNTHROID, LEVOTHROID) 125 MCG tablet Take 125 mcg by mouth at bedtime.    Yes Historical Provider, MD  Multiple Vitamins-Iron (CHLORELLA PO) Take 1 tablet by mouth daily as needed (meal supplement).   Yes Historical Provider, MD  omeprazole (PRILOSEC) 40 MG capsule Take 1 capsule (40 mg total) by mouth daily. 04/07/14  Yes Irene Shipper, MD  OVER THE COUNTER MEDICATION Place 2 sprays into both nostrils at bedtime. Over the counter nasal spray.   Yes Historical Provider, MD  ciprofloxacin (CIPRO) 500 MG tablet Take 1 tablet (500 mg total) by mouth 2 (two) times daily. 05/27/14   Lacretia Leigh, MD  metroNIDAZOLE (FLAGYL) 500 MG tablet Take 1 tablet (500 mg total) by mouth 2 (two) times daily. 05/27/14   Lacretia Leigh, MD    Allergies:   Allergies  Allergen Reactions  . Penicillins Other (See Comments)    Boils    Social History:  Ambulatory   independently   Lives at home  With family     reports that he quit smoking about 28 years ago. His smoking use included Cigarettes. He has a 20 pack-year smoking history. He has never used smokeless tobacco. He reports that he does not drink alcohol or use illicit drugs.    Family History: family history includes Alzheimer's disease in his sister; Cancer in his brother and father; Heart disease in his mother and sister; Hypertension in his sister; Lung cancer in his brother; Melanoma in his father. There is no history of Colon cancer.    Physical Exam: Patient Vitals for the past 24 hrs:  BP Temp  Temp src Pulse Resp SpO2  05/28/14 0006 168/66 mmHg - - 91 16 93 %  05/27/14 2200 165/66 mmHg - - 98 - 97 %  05/27/14 2128 175/69 mmHg - - 96 16 97 %  05/27/14 1947 157/74 mmHg 98.1 F (36.7 C) Oral 105 20 96 %    1. General:  in No Acute distress 2. Psychological: Alert and  Oriented 3. Head/ENT:    Dry Mucous Membranes                          Head Non traumatic, neck supple                          Normal   Dentition 4. SKIN:   decreased Skin turgor,  Skin clean Dry and intact no rash 5. Heart: Regular rate and rhythm no  Murmur, Rub or gallop 6. Lungs: Clear to auscultation bilaterally, no wheezes or crackles   7. Abdomen: Soft, lower abdominal tenderness, Non distended 8. Lower extremities: no clubbing, cyanosis, or edema 9. Neurologically Grossly intact, moving all 4 extremities equally 10. MSK: Normal range of motion  body mass index is unknown because there is no weight on file.   Labs on Admission:   Results for orders placed or performed during the hospital encounter of 05/27/14 (from the past 24 hour(s))  CBC with Differential/Platelet     Status: Abnormal   Collection Time: 05/27/14  6:03 PM  Result Value Ref Range   WBC 16.0 (H) 4.0 - 10.5 K/uL   RBC 4.26 4.22 - 5.81 MIL/uL   Hemoglobin 13.2 13.0 - 17.0 g/dL   HCT 39.7 39.0 - 52.0 %   MCV 93.2 78.0 - 100.0 fL   MCH 31.0 26.0 - 34.0 pg   MCHC 33.2 30.0 - 36.0 g/dL   RDW 13.1 11.5 - 15.5 %   Platelets 192 150 - 400 K/uL   Neutrophils Relative % 88 (H) 43 - 77 %   Neutro Abs 14.1 (H) 1.7 - 7.7 K/uL   Lymphocytes Relative 6 (L) 12 - 46 %   Lymphs Abs 0.9 0.7 - 4.0 K/uL   Monocytes Relative 6 3 - 12 %   Monocytes Absolute 1.0 0.1 - 1.0 K/uL   Eosinophils Relative 0 0 - 5 %   Eosinophils Absolute 0.0 0.0 - 0.7 K/uL   Basophils Relative 0 0 - 1 %   Basophils Absolute 0.0 0.0 - 0.1 K/uL  Comprehensive metabolic panel     Status: Abnormal   Collection Time: 05/27/14  6:03 PM  Result Value Ref Range   Sodium 139  135 - 145 mmol/L   Potassium 3.6 3.5 - 5.1 mmol/L   Chloride 107 101 - 111 mmol/L   CO2 21 (L) 22 - 32 mmol/L   Glucose, Bld 149 (H) 65 - 99 mg/dL   BUN 24 (H) 6 - 20 mg/dL   Creatinine, Ser 1.56 (H) 0.61 - 1.24 mg/dL   Calcium 9.1 8.9 - 10.3 mg/dL   Total Protein 6.8 6.5 - 8.1 g/dL   Albumin 4.1 3.5 - 5.0 g/dL   AST 22 15 - 41 U/L   ALT 17 17 - 63 U/L   Alkaline Phosphatase 100 38 - 126 U/L   Total Bilirubin 0.7 0.3 - 1.2 mg/dL   GFR calc non Af Amer 41 (L) >60 mL/min   GFR calc Af Amer 47 (L) >60 mL/min   Anion gap 11 5 - 15  Lipase, blood     Status: None   Collection Time: 05/27/14  6:03 PM  Result Value Ref Range   Lipase 23 22 - 51 U/L    UA not obtained  No results found for: HGBA1C  CrCl cannot be calculated (Unknown ideal weight.).  BNP (last 3 results) No results for input(s): PROBNP in the last 8760 hours.  Other results:  I have pearsonaly reviewed this: ECG REPORT not obtained   There were no vitals filed for this visit.   Cultures:    Component Value Date/Time   SDES WOUND LEFT LEG 06/10/2013 1731   SDES WOUND LEFT LEG 06/10/2013 1731   SPECREQUEST INFECTED LYMPHOCELE 06/10/2013 1731   SPECREQUEST INFECTED LYMPHOCELE 06/10/2013 1731   CULT  06/10/2013 1731    NO ANAEROBES ISOLATED Performed at [facility]  06/10/2013 1731  NO GROWTH 2 DAYS Performed at [facility] 06/15/2013 FINAL 06/10/2013 1731   REPTSTATUS 06/13/2013 FINAL 06/10/2013 1731     Radiological Exams on Admission: Ct Abdomen Pelvis W Contrast  05/27/2014   CLINICAL DATA:  Patient recently started male replacement sub limit. Now with nausea, vomiting and diarrhea as well as lower abdominal pain.  EXAM: CT ABDOMEN AND PELVIS WITH CONTRAST  TECHNIQUE: Multidetector CT imaging of the abdomen and pelvis was performed using the standard protocol following bolus administration of intravenous contrast.  CONTRAST:  20mL OMNIPAQUE IOHEXOL 300 MG/ML   SOLN  COMPARISON:  CT abdomen pelvis 05/08/2009  FINDINGS: Lower chest: Minimal dependent atelectasis right greater than left lower lobes. Normal heart size.  Hepatobiliary: No significant interval change in scattered hypodensities within liver. Liver is normal in size and contour. Gallbladder is unremarkable. No intrahepatic or extrahepatic biliary ductal dilatation  Pancreas: Unremarkable  Spleen:  Unremarkable.  Adrenals/Urinary Tract: Normal adrenal glands. Kidneys enhance symmetrically with contrast. No significant interval change in multiple bilateral renal hypodensities, favored to represent cysts. Many are too small to characterize. No hydronephrosis. Urinary bladder is unremarkable.  Stomach/Bowel: Extensive sigmoid colonic diverticulosis. There is wall thickening of descending and sigmoid colon with pericolonic fat stranding and small amount of fluid within left paracolic gutter. Additionally there is mild wall thickening of the distal ileum with associated mucosal hyperenhancement. No evidence for bowel obstruction. No free intraperitoneal air. Oral contrast within the distal esophagus.  Vascular/Lymphatic: There is mild tortuosity of the abdominal aorta with peripheral calcified atherosclerotic plaque. No retroperitoneal lymphadenopathy.  Other: Fat containing right inguinal hernia.  Musculoskeletal: Lower lumbar spine degenerative changes without aggressive or acute appearing osseous lesions.  IMPRESSION: There is wall thickening and pericolonic fat stranding involving the sigmoid and distal descending colon, most concerning for acute colitis, given the longer segment of distribution. Considerations include infectious, inflammatory ischemic etiologies.  There is mild wall thickening and associated mucosal hyper-enhancement involving the distal ileum which may represent a concomitant enteritis or potentially inflammatory change associated with the adjacent colitis.  Oral contrast in the distal esophagus  suggestive of reflux.   Electronically Signed   By: Lovey Newcomer M.D.   On: 05/27/2014 19:53    Chart has been reviewed  Family not at  Bedside   Assessment/Plan  81 yo M with hx of HTN and PVD presents with sudden onset of abdominal pain, diarrhea, nausea and vomiting after taking a supplement.   Present on Admission:  . Essential hypertension - given dehydration hold of on amlodipine for tonight to watch pressures . Colitis - IV flagyl, cipro, stool cultures, order lactic acid possibly infection vs ischemic. GI consult in AM  . Blood in stool - minimal stool in ER visualized, on plavix for PVD will hold . Dehydration  resuscitate with IVF.    Prophylaxis: SCD   CODE STATUS:    DNR/DNI as per patient    Disposition:  To home once workup is complete and patient is stable  Other plan as per orders.  I have spent a total of 55 min on this admission  Shekinah Pitones 05/28/2014, 12:41 AM  [facility]  Pager [phone]   after 2 AM please page floor coverage PA If 7AM-7PM, please contact the day team taking care of the patient  [email or web address]  Password [email or web address]

## 2014-05-28 NOTE — Progress Notes (Addendum)
[facility] PROGRESS NOTE  Walter [name] HDQ:[id number] DOB: 04-17-1933 DOA: 05/27/2014 PCP: Donnie Coffin, MD   Brief narrative 81 year old male with history of Barrett's esophagus, diverticulosis, hypothyroidism, peripheral vascular disease, GERD who presented with one-day history of lower abdominal pain associated with diarrhea, some nausea and vomiting. He started taking an OTC supplement sun Chlorella (multivitamin), one day prior to admission. In the ED patient was hemodynamically stable but had significant leukocytosis and acute kidney injury. CT scan of the abdomen and pelvis done showed acute colitis in the distal descending colon. Stool for C. difficile was positive.  Assessment/Plan: SIRS due to Acute colitis  Likely secondary to C. difficile colitis. Monitor on telemetry. Clear liquid. Will discontinue PPI. Sun chlorella would not cause cdiff. On empiric IV  Flagyl. D/c flagyl. Continue IV hydration. Serial abdominal exam. H&H stable -Appreciate GI evaluation.  Rectal bleed Possibly related to infectious colitis. H&H stable  Acute kidney injury Secondary to dehydration. Monitor with fluids  Essential hypertension Holding blood pressure medications given dehydration.  Hypothyroidism Continue Synthroid  Peripheral vascular disease Hold Plavix given rectal bleed. Continue statin  History of Barrett's esophagus On chronic PPI which I will hold given acute C. Difficile. Sees labeaur GI.     DVT prophylaxis: SCDs  Diet: Clear liquid  Code Status: Full code Family Communication: None at bedside Disposition Plan: Home possibly in the next 48 hours if symptoms improve   Consultants:  GI (Dr. Benson Norway)  Procedures:  CT abdomen and pelvis  Antibiotics:  IV cipro x 2  Flagyl 5/20--  HPI/Subjective: Patient seen and examined. Still has lower abdominal pain and mild distention. Had 3-4 episodes of loose bowel movement with some bright red blood  overnight.  Objective: Filed Vitals:   05/28/14 1053  BP: 125/55  Pulse: 71  Temp: 98.1 F (36.7 C)  Resp: 20   No intake or output data in the 24 hours ending 05/28/14 1253 Filed Weights   05/28/14 1053  Weight: 92.035 kg (202 lb 14.4 oz)    Exam:   General:  Elderly male in some distress with pain.  HEENT: No pallor, moist oral mucosa, supple  Chest: Clear to auscultation bilaterally, no added sounds  CVS: Normal S1 and S2, no murmurs or gallop  GI: Soft, nondistended, lower abdominal tenderness, bowel sounds present  Musculoskeletal: Warm, no edema  CNS: Alert and oriented    Data Reviewed: Basic Metabolic Panel:  Recent Labs Lab 05/27/14 1803 05/28/14 0553  NA 139 139  K 3.6 4.0  CL 107 106  CO2 21* 24  GLUCOSE 149* 116*  BUN 24* 25*  CREATININE 1.56* 1.32*  CALCIUM 9.1 8.7*  MG  --  1.8  PHOS  --  3.6   Liver Function Tests:  Recent Labs Lab 05/27/14 1803 05/28/14 0553  AST 22 18  ALT 17 13*  ALKPHOS 100 76  BILITOT 0.7 0.6  PROT 6.8 5.9*  ALBUMIN 4.1 3.2*    Recent Labs Lab 05/27/14 1803  LIPASE 23   No results for input(s): AMMONIA in the last 168 hours. CBC:  Recent Labs Lab 05/27/14 1803 05/28/14 0051 05/28/14 0553  WBC 16.0* 12.1* 10.1  NEUTROABS 14.1*  --   --   HGB 13.2 13.2 11.9*  HCT 39.7 39.3 36.0*  MCV 93.2 93.1 93.3  PLT 192 207 191   Cardiac Enzymes: No results for input(s): CKTOTAL, CKMB, CKMBINDEX, TROPONINI in the last 168 hours. BNP (last 3 results) No results for input(s): BNP in  the last 8760 hours.  ProBNP (last 3 results) No results for input(s): PROBNP in the last 8760 hours.  CBG: No results for input(s): GLUCAP in the last 168 hours.  Recent Results (from the past 240 hour(s))  MRSA PCR Screening     Status: None   Collection Time: 05/28/14  3:37 AM  Result Value Ref Range Status   MRSA by PCR NEGATIVE NEGATIVE Final    Comment:        The GeneXpert MRSA Assay (FDA approved for NASAL  specimens only), is one component of a comprehensive MRSA colonization surveillance program. It is not intended to diagnose MRSA infection nor to guide or monitor treatment for MRSA infections.   Clostridium Difficile by PCR     Status: Abnormal   Collection Time: 05/28/14  9:19 AM  Result Value Ref Range Status   C difficile by pcr POSITIVE (A) NEGATIVE Final    Comment: CRITICAL RESULT CALLED TO, READ BACK BY AND VERIFIED WITH: STEWART,S AT 12:30PM ON 05/28/14 BY Derrill Memo Performed at [facility]      Studies: Ct Abdomen Pelvis W Contrast  05/27/2014   CLINICAL DATA:  Patient recently started male replacement sub limit. Now with nausea, vomiting and diarrhea as well as lower abdominal pain.  EXAM: CT ABDOMEN AND PELVIS WITH CONTRAST  TECHNIQUE: Multidetector CT imaging of the abdomen and pelvis was performed using the standard protocol following bolus administration of intravenous contrast.  CONTRAST:  58mL OMNIPAQUE IOHEXOL 300 MG/ML  SOLN  COMPARISON:  CT abdomen pelvis 05/08/2009  FINDINGS: Lower chest: Minimal dependent atelectasis right greater than left lower lobes. Normal heart size.  Hepatobiliary: No significant interval change in scattered hypodensities within liver. Liver is normal in size and contour. Gallbladder is unremarkable. No intrahepatic or extrahepatic biliary ductal dilatation  Pancreas: Unremarkable  Spleen:  Unremarkable.  Adrenals/Urinary Tract: Normal adrenal glands. Kidneys enhance symmetrically with contrast. No significant interval change in multiple bilateral renal hypodensities, favored to represent cysts. Many are too small to characterize. No hydronephrosis. Urinary bladder is unremarkable.  Stomach/Bowel: Extensive sigmoid colonic diverticulosis. There is wall thickening of descending and sigmoid colon with pericolonic fat stranding and small amount of fluid within left paracolic gutter. Additionally there is mild wall thickening of the distal ileum  with associated mucosal hyperenhancement. No evidence for bowel obstruction. No free intraperitoneal air. Oral contrast within the distal esophagus.  Vascular/Lymphatic: There is mild tortuosity of the abdominal aorta with peripheral calcified atherosclerotic plaque. No retroperitoneal lymphadenopathy.  Other: Fat containing right inguinal hernia.  Musculoskeletal: Lower lumbar spine degenerative changes without aggressive or acute appearing osseous lesions.  IMPRESSION: There is wall thickening and pericolonic fat stranding involving the sigmoid and distal descending colon, most concerning for acute colitis, given the longer segment of distribution. Considerations include infectious, inflammatory ischemic etiologies.  There is mild wall thickening and associated mucosal hyper-enhancement involving the distal ileum which may represent a concomitant enteritis or potentially inflammatory change associated with the adjacent colitis.  Oral contrast in the distal esophagus suggestive of reflux.   Electronically Signed   By: Lovey Newcomer M.D.   On: 05/27/2014 19:53    Scheduled Meds: . atorvastatin  40 mg Oral QHS  . ciprofloxacin  400 mg Intravenous Q12H  . levothyroxine  125 mcg Oral QHS  . metronidazole  500 mg Intravenous Q8H  . pantoprazole  40 mg Oral Daily  . sodium chloride  3 mL Intravenous Q12H   Continuous Infusions: . sodium chloride 125  mL/hr at 05/28/14 1048      Time spent: [place] [place] [facility] Pager [phone]. If 7PM-7AM, please contact night-coverage at [email or web address], password [facility] 05/28/2014, 12:53 PM  LOS: 0 days

## 2014-05-29 LAB — BASIC METABOLIC PANEL
Anion gap: 6 (ref 5–15)
BUN: 21 mg/dL — ABNORMAL HIGH (ref 6–20)
CALCIUM: 8.2 mg/dL — AB (ref 8.9–10.3)
CHLORIDE: 107 mmol/L (ref 101–111)
CO2: 23 mmol/L (ref 22–32)
CREATININE: 1.45 mg/dL — AB (ref 0.61–1.24)
GFR calc Af Amer: 52 mL/min — ABNORMAL LOW (ref >60)
GFR calc non Af Amer: 45 mL/min — ABNORMAL LOW (ref >60)
Glucose, Bld: 94 mg/dL (ref 65–99)
POTASSIUM: 3.8 mmol/L (ref 3.5–5.1)
Sodium: 136 mmol/L (ref 135–145)

## 2014-05-29 LAB — CBC
HEMATOCRIT: 31.4 % — AB (ref 39.0–52.0)
Hemoglobin: 10.3 g/dL — ABNORMAL LOW (ref 13.0–17.0)
MCH: 31.1 pg (ref 26.0–34.0)
MCHC: 32.8 g/dL (ref 30.0–36.0)
MCV: 94.9 fL (ref 78.0–100.0)
Platelets: 167 10*3/uL (ref 150–400)
RBC: 3.31 MIL/uL — ABNORMAL LOW (ref 4.22–5.81)
RDW: 13.8 % (ref 11.5–15.5)
WBC: 9.5 10*3/uL (ref 4.0–10.5)

## 2014-05-29 NOTE — Progress Notes (Signed)
Progress Note for [facility]  Subjective: Abdominal pain decreasing, but still with diarrhea.  Objective: Vital signs in last 24 hours: Temp:  [98 F (36.7 C)-99.2 F (37.3 C)] 98.8 F (37.1 C) (05/22 0400) Pulse Rate:  [64-74] 66 (05/22 0400) Resp:  [18-20] 18 (05/22 0400) BP: (122-141)/(44-58) 122/47 mmHg (05/22 0400) SpO2:  [95 %-100 %] 95 % (05/22 0400) Weight:  [92.035 kg (202 lb 14.4 oz)] 92.035 kg (202 lb 14.4 oz) (05/21 1053) Last BM Date: 05/29/14  Intake/Output from previous day: 05/21 0701 - 05/22 0700 In: 2358.8 [I.V.:2058.8; IV Piggyback:300] Out: -  Intake/Output this shift:    General appearance: alert and no distress GI: some lower abdominal tenderness, improved.  Lab Results:  Recent Labs  05/28/14 0051 05/28/14 0553 05/29/14 0513  WBC 12.1* 10.1 9.5  HGB 13.2 11.9* 10.3*  HCT 39.3 36.0* 31.4*  PLT 207 191 167   BMET  Recent Labs  05/27/14 1803 05/28/14 0553 05/29/14 0513  NA 139 139 136  K 3.6 4.0 3.8  CL 107 106 107  CO2 21* 24 23  GLUCOSE 149* 116* 94  BUN 24* 25* 21*  CREATININE 1.56* 1.32* 1.45*  CALCIUM 9.1 8.7* 8.2*   LFT  Recent Labs  05/28/14 0553  PROT 5.9*  ALBUMIN 3.2*  AST 18  ALT 13*  ALKPHOS 76  BILITOT 0.6   PT/INR No results for input(s): LABPROT, INR in the last 72 hours. Hepatitis Panel No results for input(s): HEPBSAG, HCVAB, HEPAIGM, HEPBIGM in the last 72 hours. C-Diff No results for input(s): CDIFFTOX in the last 72 hours. Fecal Lactopherrin No results for input(s): FECLLACTOFRN in the last 72 hours.  Studies/Results: Ct Abdomen Pelvis W Contrast  05/27/2014   CLINICAL DATA:  Patient recently started male replacement sub limit. Now with nausea, vomiting and diarrhea as well as lower abdominal pain.  EXAM: CT ABDOMEN AND PELVIS WITH CONTRAST  TECHNIQUE: Multidetector CT imaging of the abdomen and pelvis was performed using the standard protocol following bolus administration of intravenous contrast.   CONTRAST:  87mL OMNIPAQUE IOHEXOL 300 MG/ML  SOLN  COMPARISON:  CT abdomen pelvis 05/08/2009  FINDINGS: Lower chest: Minimal dependent atelectasis right greater than left lower lobes. Normal heart size.  Hepatobiliary: No significant interval change in scattered hypodensities within liver. Liver is normal in size and contour. Gallbladder is unremarkable. No intrahepatic or extrahepatic biliary ductal dilatation  Pancreas: Unremarkable  Spleen:  Unremarkable.  Adrenals/Urinary Tract: Normal adrenal glands. Kidneys enhance symmetrically with contrast. No significant interval change in multiple bilateral renal hypodensities, favored to represent cysts. Many are too small to characterize. No hydronephrosis. Urinary bladder is unremarkable.  Stomach/Bowel: Extensive sigmoid colonic diverticulosis. There is wall thickening of descending and sigmoid colon with pericolonic fat stranding and small amount of fluid within left paracolic gutter. Additionally there is mild wall thickening of the distal ileum with associated mucosal hyperenhancement. No evidence for bowel obstruction. No free intraperitoneal air. Oral contrast within the distal esophagus.  Vascular/Lymphatic: There is mild tortuosity of the abdominal aorta with peripheral calcified atherosclerotic plaque. No retroperitoneal lymphadenopathy.  Other: Fat containing right inguinal hernia.  Musculoskeletal: Lower lumbar spine degenerative changes without aggressive or acute appearing osseous lesions.  IMPRESSION: There is wall thickening and pericolonic fat stranding involving the sigmoid and distal descending colon, most concerning for acute colitis, given the longer segment of distribution. Considerations include infectious, inflammatory ischemic etiologies.  There is mild wall thickening and associated mucosal hyper-enhancement involving the distal ileum which may  represent a concomitant enteritis or potentially inflammatory change associated with the adjacent  colitis.  Oral contrast in the distal esophagus suggestive of reflux.   Electronically Signed   By: Lovey Newcomer M.D.   On: 05/27/2014 19:53    Medications:  Scheduled: . atorvastatin  40 mg Oral QHS  . levothyroxine  125 mcg Oral QHS  . metronidazole  500 mg Intravenous Q8H  . sodium chloride  3 mL Intravenous Q12H   Continuous: . sodium chloride 75 mL/hr at 05/29/14 0216    Assessment/Plan: 1) C. Diff colitis. 2) Lower abdominal pain.   He is improving with metronidazole.  Once his diarrhea resolves, I think he can be discharged home.  Plan: 1) Continue with metronidazole.   LOS: 1 day   Brannan Cassedy D 05/29/2014, 9:03 AM

## 2014-05-29 NOTE — Progress Notes (Signed)
[facility] PROGRESS NOTE  BHARAT ANTILLON ZOX:[id number] DOB: [birth year] DOA: 05/27/2014 PCP: Donnie Coffin, MD   Brief narrative 81 year old male with history of Barrett's esophagus, diverticulosis, hypothyroidism, peripheral vascular disease, GERD who presented with one-day history of lower abdominal pain associated with diarrhea, some nausea and vomiting. He started taking an OTC supplement sun Chlorella (multivitamin), one day prior to admission. In the ED patient was hemodynamically stable but had significant leukocytosis and acute kidney injury. CT scan of the abdomen and pelvis done showed acute colitis in the distal descending colon. Stool for C. difficile was positive.  Assessment/Plan: SIRS due to Acute C. difficile colitis -Clinically improved. Continue empiric Flagyl. Diarrhea improved with no further rectal bleed. -Advance diet to full liquid.  Rectal bleed Secondary to C. difficile colitis. Result. H&H stable.  Acute kidney injury Secondary to dehydration. Worsened today. Monitor with gentle hydration.  Essential hypertension Holding blood pressure medications given dehydration.  Hypothyroidism Continue Synthroid  Peripheral vascular disease Hold Plavix given rectal bleed. Continue statin  History of Barrett's esophagus On chronic PPI which is held given acute C. Difficile. Sees [facility] GI.     DVT prophylaxis: SCDs  Diet: full liquid  Code Status: Full code Family Communication: None at bedside Disposition Plan: Home in 24-48 hrs if continues to improve.   Consultants:  GI (Dr. Benson Norway)  Procedures:  CT abdomen and pelvis  Antibiotics:  IV cipro x 2  Flagyl 5/20--  HPI/Subjective: Patient seen and examined. Abdominal pain much improved. Diarrhea better. No rectal bleed.  Objective: Filed Vitals:   05/29/14 1400  BP: 128/69  Pulse: 73  Temp: 99.3 F (37.4 C)  Resp: 20    Intake/Output Summary (Last 24 hours) at 05/29/14  1402 Last data filed at 05/29/14 1120  Gross per 24 hour  Intake 2498.75 ml  Output      0 ml  Net 2498.75 ml   Filed Weights   05/28/14 1053  Weight: 92.035 kg (202 lb 14.4 oz)    Exam:   General:  Elderly male in no acute distress  HEENT: moist oral mucosa, supple neck  Chest: Clear to auscultation bilaterally, no added sounds  CVS: Normal S1 and S2, no murmurs or gallop  GI: Soft, nondistended, nontender bowel sounds present  Musculoskeletal: Warm, no edema  CNS: Alert and oriented    Data Reviewed: Basic Metabolic Panel:  Recent Labs Lab 05/27/14 1803 05/28/14 0553 05/29/14 0513  NA 139 139 136  K 3.6 4.0 3.8  CL 107 106 107  CO2 21* 24 23  GLUCOSE 149* 116* 94  BUN 24* 25* 21*  CREATININE 1.56* 1.32* 1.45*  CALCIUM 9.1 8.7* 8.2*  MG  --  1.8  --   PHOS  --  3.6  --    Liver Function Tests:  Recent Labs Lab 05/27/14 1803 05/28/14 0553  AST 22 18  ALT 17 13*  ALKPHOS 100 76  BILITOT 0.7 0.6  PROT 6.8 5.9*  ALBUMIN 4.1 3.2*    Recent Labs Lab 05/27/14 1803  LIPASE 23   No results for input(s): AMMONIA in the last 168 hours. CBC:  Recent Labs Lab 05/27/14 1803 05/28/14 0051 05/28/14 0553 05/29/14 0513  WBC 16.0* 12.1* 10.1 9.5  NEUTROABS 14.1*  --   --   --   HGB 13.2 13.2 11.9* 10.3*  HCT 39.7 39.3 36.0* 31.4*  MCV 93.2 93.1 93.3 94.9  PLT 192 207 191 167   Cardiac Enzymes: No results for input(s): CKTOTAL,  CKMB, CKMBINDEX, TROPONINI in the last 168 hours. BNP (last 3 results) No results for input(s): BNP in the last 8760 hours.  ProBNP (last 3 results) No results for input(s): PROBNP in the last 8760 hours.  CBG: No results for input(s): GLUCAP in the last 168 hours.  Recent Results (from the past 240 hour(s))  MRSA PCR Screening     Status: None   Collection Time: 05/28/14  3:37 AM  Result Value Ref Range Status   MRSA by PCR NEGATIVE NEGATIVE Final    Comment:        The GeneXpert MRSA Assay (FDA approved for  NASAL specimens only), is one component of a comprehensive MRSA colonization surveillance program. It is not intended to diagnose MRSA infection nor to guide or monitor treatment for MRSA infections.   Clostridium Difficile by PCR     Status: Abnormal   Collection Time: 05/28/14  9:19 AM  Result Value Ref Range Status   C difficile by pcr POSITIVE (A) NEGATIVE Final    Comment: CRITICAL RESULT CALLED TO, READ BACK BY AND VERIFIED WITH: STEWART,S AT 12:30PM ON 05/28/14 BY Derrill Memo Performed at [facility]      Studies: Ct Abdomen Pelvis W Contrast  05/27/2014   CLINICAL DATA:  Patient recently started male replacement sub limit. Now with nausea, vomiting and diarrhea as well as lower abdominal pain.  EXAM: CT ABDOMEN AND PELVIS WITH CONTRAST  TECHNIQUE: Multidetector CT imaging of the abdomen and pelvis was performed using the standard protocol following bolus administration of intravenous contrast.  CONTRAST:  34mL OMNIPAQUE IOHEXOL 300 MG/ML  SOLN  COMPARISON:  CT abdomen pelvis 05/08/2009  FINDINGS: Lower chest: Minimal dependent atelectasis right greater than left lower lobes. Normal heart size.  Hepatobiliary: No significant interval change in scattered hypodensities within liver. Liver is normal in size and contour. Gallbladder is unremarkable. No intrahepatic or extrahepatic biliary ductal dilatation  Pancreas: Unremarkable  Spleen:  Unremarkable.  Adrenals/Urinary Tract: Normal adrenal glands. Kidneys enhance symmetrically with contrast. No significant interval change in multiple bilateral renal hypodensities, favored to represent cysts. Many are too small to characterize. No hydronephrosis. Urinary bladder is unremarkable.  Stomach/Bowel: Extensive sigmoid colonic diverticulosis. There is wall thickening of descending and sigmoid colon with pericolonic fat stranding and small amount of fluid within left paracolic gutter. Additionally there is mild wall thickening of the distal  ileum with associated mucosal hyperenhancement. No evidence for bowel obstruction. No free intraperitoneal air. Oral contrast within the distal esophagus.  Vascular/Lymphatic: There is mild tortuosity of the abdominal aorta with peripheral calcified atherosclerotic plaque. No retroperitoneal lymphadenopathy.  Other: Fat containing right inguinal hernia.  Musculoskeletal: Lower lumbar spine degenerative changes without aggressive or acute appearing osseous lesions.  IMPRESSION: There is wall thickening and pericolonic fat stranding involving the sigmoid and distal descending colon, most concerning for acute colitis, given the longer segment of distribution. Considerations include infectious, inflammatory ischemic etiologies.  There is mild wall thickening and associated mucosal hyper-enhancement involving the distal ileum which may represent a concomitant enteritis or potentially inflammatory change associated with the adjacent colitis.  Oral contrast in the distal esophagus suggestive of reflux.   Electronically Signed   By: Lovey Newcomer M.D.   On: 05/27/2014 19:53    Scheduled Meds: . atorvastatin  40 mg Oral QHS  . levothyroxine  125 mcg Oral QHS  . metronidazole  500 mg Intravenous Q8H  . sodium chloride  3 mL Intravenous Q12H   Continuous Infusions: . sodium  chloride 75 mL/hr at 05/29/14 0216      Time spent: [place]  [facility] Pager [phone]. If 7PM-7AM, please contact night-coverage at [email or web address], password [facility] 05/29/2014, 2:02 PM  LOS: 1 day

## 2014-05-30 DIAGNOSIS — A419 Sepsis, unspecified organism: Secondary | ICD-10-CM

## 2014-05-30 DIAGNOSIS — R6510 Systemic inflammatory response syndrome (SIRS) of non-infectious origin without acute organ dysfunction: Secondary | ICD-10-CM | POA: Diagnosis present

## 2014-05-30 DIAGNOSIS — K921 Melena: Secondary | ICD-10-CM

## 2014-05-30 LAB — BASIC METABOLIC PANEL
Anion gap: 7 (ref 5–15)
BUN: 15 mg/dL (ref 6–20)
CHLORIDE: 112 mmol/L — AB (ref 101–111)
CO2: 22 mmol/L (ref 22–32)
Calcium: 8.1 mg/dL — ABNORMAL LOW (ref 8.9–10.3)
Creatinine, Ser: 1.29 mg/dL — ABNORMAL HIGH (ref 0.61–1.24)
GFR calc non Af Amer: 51 mL/min — ABNORMAL LOW (ref >60)
GFR, EST AFRICAN AMERICAN: 60 mL/min — AB (ref >60)
Glucose, Bld: 98 mg/dL (ref 65–99)
Potassium: 3.6 mmol/L (ref 3.5–5.1)
Sodium: 141 mmol/L (ref 135–145)

## 2014-05-30 MED ORDER — METRONIDAZOLE 500 MG PO TABS: 500.0000 mg | ORAL_TABLET | Freq: Three times a day (TID) | ORAL | Status: AC

## 2014-05-30 NOTE — Discharge Summary (Signed)
Physician Discharge Summary  Walter [name]:[id number] DOB: 05/12/1933 DOA: 05/27/2014  PCP: Donnie Coffin, MD  Admit date: 05/27/2014 Discharge date: 05/30/2014  Time spent: 35 minutes  Recommendations for Outpatient Follow-up:  1. Discharge home with outpatient PCP follow-up in 1-2 weeks. 2. Patient will complete 14 day course of oral Flagyl for C. difficile on 06/10/2014  Discharge Diagnoses:  Principal Problem:   SIRS secondary to Clostridium difficile colitis   Active Problems:   Essential hypertension   Blood in stool   Dehydration   Acute kidney injury   Abdominal pain   Discharge Condition: Fair  Diet recommendation: Low sodium  Code Status: DO NOT RESUSCITATE  Filed Weights   05/28/14 1053  Weight: 92.035 kg (202 lb 14.4 oz)    History of present illness:  Please refer to admission H&P for details, in brief, 81 year old male with history of Barrett's esophagus, diverticulosis, hypothyroidism, peripheral vascular disease, GERD who presented with one-day history of lower abdominal pain associated with diarrhea, some nausea and vomiting. He started taking an OTC supplement sun Chlorella (multivitamin), one day prior to admission. In the ED patient was hemodynamically stable but had significant leukocytosis and acute kidney injury. CT scan of the abdomen and pelvis done showed acute colitis in the distal descending colon. Stool for C. difficile was positive. Patient met criteria for SIRS on admission and  Hospital Course:  SIRS due to Acute C. difficile colitis -Clinically improved. Has had 4 episodes of diarrhea since last evening, decreased in amount and no further rectal  Bleed. -Tolerating advanced diet. -Patient will be discharged home on oral Flagyl 500 mg every 8 hours to complete a total 2 weeks course of antibiotics. -Appreciate GI evaluation and recommendations. Patient taking sun chlorella ( multivitamin ) as outpt which has not been reported to cause  cdiff and may continue to use it.   Rectal bleed Secondary to C. difficile colitis. Now resolved and H&H stable.  Acute kidney injury Secondary to dehydration. Stable with gentle hydration. Follow-up as outpatient.  Essential hypertension Blood pressure medications held due to dehydration. Resume upon discharge.  Hypothyroidism Continue Synthroid  Peripheral vascular disease resume Plavix upon discharge since  further symptoms since admission and H&H stable.  Continue statin.  History of Barrett's esophagus On chronic PPI which I have discontinuedgiven acute C. Difficile. Follow-up with GI as outpatient.     DVT prophylaxis: SCDs  Diet: full liquid  Code Status: Full code Family Communication: None at bedside Disposition Plan: Home in 24-48 hrs if continues to improve.   Consultants:  GI (Dr. Benson Norway)  Procedures:  CT abdomen and pelvis  Antibiotics:  IV cipro x 2  Flagyl 5/20--    Discharge Exam: Filed Vitals:   05/30/14 0637  BP: 131/51  Pulse: 56  Temp: 98.4 F (36.9 C)  Resp: 20    General: Elderly male in no acute distress HEENT: No pallor, moist oral mucosa, supple neck Chest: Clear to auscultation bilaterally, no added sounds CVS: Normal S1 and S2, no murmurs or gallop GI: Soft, nondistended, nontender, bowel sounds present [name] skilled: Warm, no edema CNS: Alert and oriented  Discharge Instructions    Current Discharge Medication List    START taking these medications   Details  metroNIDAZOLE (FLAGYL) 500 MG tablet Take 1 tablet (500 mg total) by mouth 3 (three) times daily. Qty: 36 tablet, Refills: 0      CONTINUE these medications which have NOT CHANGED   Details  acetaminophen (TYLENOL) 325 MG  tablet Take 650 mg by mouth at bedtime.     amLODipine (NORVASC) 5 MG tablet Take 5 mg by mouth at bedtime.     atorvastatin (LIPITOR) 40 MG tablet Take 40 mg by mouth at bedtime.     clopidogrel (PLAVIX) 75 MG tablet Take 1 tablet  (75 mg total) by mouth daily. Qty: 90 tablet, Refills: 1    levothyroxine (SYNTHROID, LEVOTHROID) 125 MCG tablet Take 125 mcg by mouth at bedtime.     Multiple Vitamins-Iron (CHLORELLA PO) Take 1 tablet by mouth daily as needed (meal supplement).    OVER THE COUNTER MEDICATION Place 2 sprays into both nostrils at bedtime. Over the counter nasal spray.      STOP taking these medications     omeprazole (PRILOSEC) 40 MG capsule        Allergies  Allergen Reactions  . Penicillins Other (See Comments)    Boils   Follow-up Information    Follow up with Lupe Carney, MD. Schedule an appointment as soon as possible for a visit in 1 week.   Specialty:  Family Medicine   Contact information:   [address] [place] [address] [phone]        The results of significant diagnostics from this hospitalization (including imaging, microbiology, ancillary and laboratory) are listed below for reference.    Significant Diagnostic Studies: Ct Abdomen Pelvis W Contrast  05/27/2014   CLINICAL DATA:  Patient recently started male replacement sub limit. Now with nausea, vomiting and diarrhea as well as lower abdominal pain.  EXAM: CT ABDOMEN AND PELVIS WITH CONTRAST  TECHNIQUE: Multidetector CT imaging of the abdomen and pelvis was performed using the standard protocol following bolus administration of intravenous contrast.  CONTRAST:  74mL OMNIPAQUE IOHEXOL 300 MG/ML  SOLN  COMPARISON:  CT abdomen pelvis 05/08/2009  FINDINGS: Lower chest: Minimal dependent atelectasis right greater than left lower lobes. Normal heart size.  Hepatobiliary: No significant interval change in scattered hypodensities within liver. Liver is normal in size and contour. Gallbladder is unremarkable. No intrahepatic or extrahepatic biliary ductal dilatation  Pancreas: Unremarkable  Spleen:  Unremarkable.  Adrenals/Urinary Tract: Normal adrenal glands. Kidneys enhance symmetrically with contrast. No  significant interval change in multiple bilateral renal hypodensities, favored to represent cysts. Many are too small to characterize. No hydronephrosis. Urinary bladder is unremarkable.  Stomach/Bowel: Extensive sigmoid colonic diverticulosis. There is wall thickening of descending and sigmoid colon with pericolonic fat stranding and small amount of fluid within left paracolic gutter. Additionally there is mild wall thickening of the distal ileum with associated mucosal hyperenhancement. No evidence for bowel obstruction. No free intraperitoneal air. Oral contrast within the distal esophagus.  Vascular/Lymphatic: There is mild tortuosity of the abdominal aorta with peripheral calcified atherosclerotic plaque. No retroperitoneal lymphadenopathy.  Other: Fat containing right inguinal hernia.  Musculoskeletal: Lower lumbar spine degenerative changes without aggressive or acute appearing osseous lesions.  IMPRESSION: There is wall thickening and pericolonic fat stranding involving the sigmoid and distal descending colon, most concerning for acute colitis, given the longer segment of distribution. Considerations include infectious, inflammatory ischemic etiologies.  There is mild wall thickening and associated mucosal hyper-enhancement involving the distal ileum which may represent a concomitant enteritis or potentially inflammatory change associated with the adjacent colitis.  Oral contrast in the distal esophagus suggestive of reflux.   Electronically Signed   By: Annia Belt M.D.   On: 05/27/2014 19:53    Microbiology: Recent Results (from the past 240 hour(s))  MRSA PCR Screening     Status: None   Collection Time: 05/28/14  3:37 AM  Result Value Ref Range Status   MRSA by PCR NEGATIVE NEGATIVE Final    Comment:        The GeneXpert MRSA Assay (FDA approved for NASAL specimens only), is one component of a comprehensive MRSA colonization surveillance program. It is not intended to diagnose  MRSA infection nor to guide or monitor treatment for MRSA infections.   Clostridium Difficile by PCR     Status: Abnormal   Collection Time: 05/28/14  9:19 AM  Result Value Ref Range Status   C difficile by pcr POSITIVE (A) NEGATIVE Final    Comment: CRITICAL RESULT CALLED TO, READ BACK BY AND VERIFIED WITH: STEWART,S AT 12:30PM ON 05/28/14 BY FESTERMAN,C Performed at [place]: Basic Metabolic Panel:  Recent Labs Lab 05/27/14 1803 05/28/14 0553 05/29/14 0513 05/30/14 0525  NA 139 139 136 141  K 3.6 4.0 3.8 3.6  CL 107 106 107 112*  CO2 21* $Remov'24 23 22  'BeuMxd$ GLUCOSE 149* 116* 94 98  BUN 24* 25* 21* 15  CREATININE 1.56* 1.32* 1.45* 1.29*  CALCIUM 9.1 8.7* 8.2* 8.1*  MG  --  1.8  --   --   PHOS  --  3.6  --   --    Liver Function Tests:  Recent Labs Lab 05/27/14 1803 05/28/14 0553  AST 22 18  ALT 17 13*  ALKPHOS 100 76  BILITOT 0.7 0.6  PROT 6.8 5.9*  ALBUMIN 4.1 3.2*    Recent Labs Lab 05/27/14 1803  LIPASE 23   No results for input(s): AMMONIA in the last 168 hours. CBC:  Recent Labs Lab 05/27/14 1803 05/28/14 0051 05/28/14 0553 05/29/14 0513  WBC 16.0* 12.1* 10.1 9.5  NEUTROABS 14.1*  --   --   --   HGB 13.2 13.2 11.9* 10.3*  HCT 39.7 39.3 36.0* 31.4*  MCV 93.2 93.1 93.3 94.9  PLT 192 207 191 167   Cardiac Enzymes: No results for input(s): CKTOTAL, CKMB, CKMBINDEX, TROPONINI in the last 168 hours. BNP: BNP (last 3 results) No results for input(s): BNP in the last 8760 hours.  ProBNP (last 3 results) No results for input(s): PROBNP in the last 8760 hours.  CBG: No results for input(s): GLUCAP in the last 168 hours.     SignedLouellen Molder  [facility] 05/30/2014, 8:47 AM

## 2014-05-30 NOTE — Discharge Instructions (Signed)
Clostridium Difficile Infection °Clostridium difficile (C. difficile) is a bacteria found in the intestinal tract or colon. Under certain conditions, it causes diarrhea and sometimes severe disease. The severe form of the disease is known as pseudomembranous colitis (often called C. difficile colitis). This disease can damage the lining of the colon or cause the colon to become enlarged (toxic megacolon). °CAUSES °Your colon normally contains many different bacteria, including C. difficile. The balance of bacteria in your colon can change during illness. This is especially true when you take antibiotic medicine. Taking antibiotics may allow the C. difficile to grow, multiply excessively, and make a toxin that then causes illness. The elderly and people with certain medical conditions have a greater risk of getting C. difficile infections. °SYMPTOMS °· Watery diarrhea. °· Fever. °· Fatigue. °· Loss of appetite. °· Nausea. °· Abdominal swelling, pain, or tenderness. °· Dehydration. °DIAGNOSIS °Your symptoms may make your caregiver suspect a C. difficile infection, especially if you have used antibiotics in the preceding weeks. However, there are only 2 ways to know for certain whether you have a C. difficile infection: °· A lab test that finds the toxin in your stool. °· The specific appearance of an abnormality (pseudomembrane) in your colon. This can only be seen by doing a sigmoidoscopy or colonoscopy. These procedures involve passing an instrument through your rectum to look at the inside of your colon. °Your caregiver will help determine if these tests are necessary. °TREATMENT °· Most people are successfully treated with one of two specific antibiotics, usually given by mouth. Other antibiotics you are receiving are stopped if possible. °· Intravenous (IV) fluids and correction of electrolyte imbalance may be necessary. °· Rarely, surgery may be needed to remove the infected part of the intestines. °· Careful  hand washing by you and your caregivers is important to prevent the spread of infection. In the hospital, your caregivers may also put on gowns and gloves to prevent the spread of the C. difficile bacteria. Your room is also cleaned regularly with a solution containing bleach or a product that is known to kill C. difficile. °HOME CARE INSTRUCTIONS °· Drink enough fluids to keep your urine clear or pale yellow. Avoid milk, caffeine, and alcohol. °· Ask your caregiver for specific rehydration instructions. °· Try eating small, frequent meals rather than large meals. °· Take your antibiotics as directed. Finish them even if you start to feel better. °· Do not use medicines to slow diarrhea. This could delay healing or cause complications. °· Wash your hands thoroughly after using the bathroom and before preparing food. °· Make sure people who live with you wash their hands often, too. °· Carefully disinfect all surfaces with a product that contains chlorine bleach. °SEEK MEDICAL CARE IF: °· Diarrhea persists longer than expected or recurs after completing your course of antibiotic treatment for the C. difficile infection. °· You have trouble staying hydrated. °SEEK IMMEDIATE MEDICAL CARE IF: °· You develop a new fever. °· You have increasing abdominal pain or tenderness. °· There is blood in your stools, or your stools are dark black and tarry. °· You cannot hold down food or liquids. °MAKE SURE YOU: °· Understand these instructions. °· Will watch your condition. °· Will get help right away if you are not doing well or get worse. °Document Released: 10/03/2004 Document Revised: 05/10/2013 Document Reviewed: 06/01/2010 °ExitCare® Patient Information ©2015 [employer]. This information is not intended to replace advice given to you by your health care provider. Make sure you   discuss any questions you have with your health care provider. ° °

## 2014-06-21 ENCOUNTER — Telehealth: Payer: Self-pay | Admitting: Internal Medicine

## 2014-06-22 NOTE — Telephone Encounter (Signed)
Patient states he has been feeling bad since last month when he went to ED. He completed the antibiotics he was given. States he has constipation. He reports small amounts of stool x 5 yesterday. Does not smell bad and the stool is hard little balls. States he has lots of gas also. He reports abdominal pain below belly button. He doesn't feel he ever got over what he had last month. No extender appointment until 07/07/14. Dr. Henrene Pastor out of office this week. DOD- Dr. Carlean Purl. Please, advise.

## 2014-06-22 NOTE — Telephone Encounter (Signed)
Unable to reach patient. No answering machine.

## 2014-06-22 NOTE — Telephone Encounter (Signed)
Let him know that very common to have bowel irregularity after C diff colitis - people get a post-infection Irritable Bowe Syndrome  Ask him to take 4 doses of MiraLax today and and call us back tomorrow with an update Bad pain, diarrhea (not from the laxative) and / or fever - go to PCP or ED or urgent care

## 2014-06-23 ENCOUNTER — Other Ambulatory Visit: Payer: Self-pay | Admitting: Internal Medicine

## 2014-06-23 ENCOUNTER — Other Ambulatory Visit: Payer: Self-pay | Admitting: Interventional Cardiology

## 2014-06-23 NOTE — Telephone Encounter (Signed)
Spoke with patient and gave him recommendations. He states he saw his PCP for regular OV and was told to take Miralax so he started it last night. He understands when to call back.

## 2014-07-12 ENCOUNTER — Encounter: Payer: Self-pay | Admitting: Cardiology

## 2014-08-11 ENCOUNTER — Encounter: Payer: Self-pay | Admitting: Internal Medicine

## 2014-09-28 ENCOUNTER — Telehealth: Payer: Self-pay

## 2014-09-28 MED ORDER — OMEPRAZOLE 40 MG PO CPDR: DELAYED_RELEASE_CAPSULE | ORAL | Status: DC

## 2014-09-28 NOTE — Telephone Encounter (Signed)
Sent refill for Omeprazole to [facility]

## 2014-10-04 ENCOUNTER — Telehealth: Payer: Self-pay

## 2014-10-04 MED ORDER — OMEPRAZOLE 40 MG PO CPDR: DELAYED_RELEASE_CAPSULE | ORAL | Status: DC

## 2014-10-04 NOTE — Telephone Encounter (Signed)
Refilled Omeprazole 

## 2014-11-21 ENCOUNTER — Encounter (HOSPITAL_COMMUNITY): Payer: Medicare Other

## 2014-11-21 ENCOUNTER — Ambulatory Visit: Payer: Medicare Other | Admitting: Family

## 2014-12-09 ENCOUNTER — Encounter: Payer: Self-pay | Admitting: Family

## 2014-12-13 ENCOUNTER — Ambulatory Visit (INDEPENDENT_AMBULATORY_CARE_PROVIDER_SITE_OTHER)
Admission: RE | Admit: 2014-12-13 | Discharge: 2014-12-13 | Disposition: A | Discharge status: Home / Self Care | Payer: Medicare Other | Source: Ambulatory Visit | Attending: Family | Admitting: Family

## 2014-12-13 ENCOUNTER — Encounter: Payer: Self-pay | Admitting: Family

## 2014-12-13 ENCOUNTER — Other Ambulatory Visit: Payer: Self-pay | Admitting: Family

## 2014-12-13 ENCOUNTER — Ambulatory Visit (INDEPENDENT_AMBULATORY_CARE_PROVIDER_SITE_OTHER): Payer: Medicare Other | Admitting: Family

## 2014-12-13 ENCOUNTER — Ambulatory Visit (HOSPITAL_COMMUNITY)
Admission: RE | Admit: 2014-12-13 | Discharge: 2014-12-13 | Disposition: A | Discharge status: Home / Self Care | Payer: Medicare Other | Source: Ambulatory Visit | Attending: Family | Admitting: Family

## 2014-12-13 VITALS — BP 139/69 | HR 52 | Temp 97.1°F | Ht 69.0 in | Wt 191.5 lb

## 2014-12-13 DIAGNOSIS — E7800 Pure hypercholesterolemia, unspecified: Secondary | ICD-10-CM | POA: Diagnosis not present

## 2014-12-13 DIAGNOSIS — Z95828 Presence of other vascular implants and grafts: Secondary | ICD-10-CM | POA: Diagnosis not present

## 2014-12-13 DIAGNOSIS — Z48812 Encounter for surgical aftercare following surgery on the circulatory system: Secondary | ICD-10-CM

## 2014-12-13 DIAGNOSIS — Z87891 Personal history of nicotine dependence: Secondary | ICD-10-CM | POA: Diagnosis not present

## 2014-12-13 DIAGNOSIS — I10 Essential (primary) hypertension: Secondary | ICD-10-CM | POA: Insufficient documentation

## 2014-12-13 DIAGNOSIS — I739 Peripheral vascular disease, unspecified: Secondary | ICD-10-CM

## 2014-12-13 NOTE — Patient Instructions (Signed)
Peripheral Vascular Disease Peripheral vascular disease (PVD) is a disease of the blood vessels that are not part of your heart and brain. A simple term for PVD is poor circulation. In most cases, PVD narrows the blood vessels that carry blood from your heart to the rest of your body. This can result in a decreased supply of blood to your arms, legs, and internal organs, like your stomach or kidneys. However, it most often affects a person's lower legs and feet. There are two types of PVD.  Organic PVD. This is the more common type. It is caused by damage to the structure of blood vessels.  Functional PVD. This is caused by conditions that make blood vessels contract and tighten (spasm). Without treatment, PVD tends to get worse over time. PVD can also lead to acute ischemic limb. This is when an arm or limb suddenly has trouble getting enough blood. This is a medical emergency. CAUSES Each type of PVD has many different causes. The most common cause of PVD is buildup of a fatty material (plaque) inside of your arteries (atherosclerosis). Small amounts of plaque can break off from the walls of the blood vessels and become lodged in a smaller artery. This blocks blood flow and can cause acute ischemic limb. Other common causes of PVD include:  Blood clots that form inside of blood vessels.  Injuries to blood vessels.  Diseases that cause inflammation of blood vessels or cause blood vessel spasms.  Health behaviors and health history that increase your risk of developing PVD. RISK FACTORS  You may have a greater risk of PVD if you:  Have a family history of PVD.  Have certain medical conditions, including:  High cholesterol.  Diabetes.  High blood pressure (hypertension).  Coronary heart disease.  Past problems with blood clots.  Past injury, such as burns or a broken bone. These may have damaged blood vessels in your limbs.  Buerger disease. This is caused by inflamed blood  vessels in your hands and feet.  Some forms of arthritis.  Rare birth defects that affect the arteries in your legs.  Use tobacco.  Do not get enough exercise.  Are obese.  Are age 50 or older. SIGNS AND SYMPTOMS  PVD may cause many different symptoms. Your symptoms depend on what part of your body is not getting enough blood. Some common signs and symptoms include:  Cramps in your lower legs. This may be a symptom of poor leg circulation (claudication).  Pain and weakness in your legs while you are physically active that goes away when you rest (intermittent claudication).  Leg pain when at rest.  Leg numbness, tingling, or weakness.  Coldness in a leg or foot, especially when compared with the other leg.  Skin or hair changes. These can include:  Hair loss.  Shiny skin.  Pale or bluish skin.  Thick toenails.  Inability to get or maintain an erection (erectile dysfunction). People with PVD are more prone to developing ulcers and sores on their toes, feet, or legs. These may take longer than normal to heal. DIAGNOSIS Your health care provider may diagnose PVD from your signs and symptoms. The health care provider will also do a physical exam. You may have tests to find out what is causing your PVD and determine its severity. Tests may include:  Blood pressure recordings from your arms and legs and measurements of the strength of your pulses (pulse volume recordings).  Imaging studies using sound waves to take pictures of   the blood flow through your blood vessels (Doppler ultrasound).  Injecting a dye into your blood vessels before having imaging studies using:  X-rays (angiogram or arteriogram).  Computer-generated X-rays (CT angiogram).  A powerful electromagnetic field and a computer (magnetic resonance angiogram or MRA). TREATMENT Treatment for PVD depends on the cause of your condition and the severity of your symptoms. It also depends on your age. Underlying  causes need to be treated and controlled. These include long-lasting (chronic) conditions, such as diabetes, high cholesterol, and high blood pressure. You may need to first try making lifestyle changes and taking medicines. Surgery may be needed if these do not work. Lifestyle changes may include:  Quitting smoking.  Exercising regularly.  Following a low-fat, low-cholesterol diet. Medicines may include:  Blood thinners to prevent blood clots.  Medicines to improve blood flow.  Medicines to improve your blood cholesterol levels. Surgical procedures may include:  A procedure that uses an inflated balloon to open a blocked artery and improve blood flow (angioplasty).  A procedure to put in a tube (stent) to keep a blocked artery open (stent implant).  Surgery to reroute blood flow around a blocked artery (peripheral bypass surgery).  Surgery to remove dead tissue from an infected wound on the affected limb.  Amputation. This is surgical removal of the affected limb. This may be necessary in cases of acute ischemic limb that are not improved through medical or surgical treatments. HOME CARE INSTRUCTIONS  Take medicines only as directed by your health care provider.  Do not use any tobacco products, including cigarettes, chewing tobacco, or electronic cigarettes. If you need help quitting, ask your health care provider.  Lose weight if you are overweight, and maintain a healthy weight as directed by your health care provider.  Eat a diet that is low in fat and cholesterol. If you need help, ask your health care provider.  Exercise regularly. Ask your health care provider to suggest some good activities for you.  Use compression stockings or other mechanical devices as directed by your health care provider.  Take good care of your feet.  Wear comfortable shoes that fit well.  Check your feet often for any cuts or sores. SEEK MEDICAL CARE IF:  You have cramps in your legs  while walking.  You have leg pain when you are at rest.  You have coldness in a leg or foot.  Your skin changes.  You have erectile dysfunction.  You have cuts or sores on your feet that are not healing. SEEK IMMEDIATE MEDICAL CARE IF:  Your arm or leg turns cold and blue.  Your arms or legs become red, warm, swollen, painful, or numb.  You have chest pain or trouble breathing.  You suddenly have weakness in your face, arm, or leg.  You become very confused or lose the ability to speak.  You suddenly have a very bad headache or lose your vision.   This information is not intended to replace advice given to you by your health care provider. Make sure you discuss any questions you have with your health care provider.   Document Released: 02/01/2004 Document Revised: 01/14/2014 Document Reviewed: 06/03/2013 Elsevier Interactive Patient Education 2016 [employer] Inc.  

## 2014-12-13 NOTE — Progress Notes (Signed)
[facility] [place] HISTORY AND PHYSICAL -PAD  History of Present Illness Walter [name] is a 81 y.o. male patient of Dr. Kellie Simmering who is s/p left femoral popliteal bypass graft using non reversed greater saphenous vein on 11/27/12 by Dr. Trula Slade.  He is also s/p I&D left leg, general anesthesia, by Dr. Trula Slade on 06/10/13. He returns today for follow up. He no longer has lymphocele problems in his left thigh since the I&D. Pt states he no longer feels the generalized bad feeling.  He had therapy for right rotator cuff issue.  He walked a great deal on his job, has been retired a few years, states walking did not help his circulation since he developed the LE arterial blockages; however, with further investigation, he smoked for 20 years until 1981 then was exposed to second hand smoke in his office chronically until about 2005 in his office. Therefore he no longer walks much, no barriers to walking, states he will start on a treadmill twice/week. He denies claudication symptoms in his legs with walking, denies non healing wounds.   He denies any history of stroke or TIA.  Pt Diabetic: No Pt smoker: former smoker, quit in 1981, then exposed to second hand smoke until about 2005 in his office.  Pt meds include: Statin :Yes Betablocker: no ASA: No, cannot take due to stage 3 CRF Other anticoagulants/antiplatelets: Plavix  The patient denies New Medical or Surgical History.    Past Medical History  Diagnosis Date  . Anxiety   . Hypertension   . Barrett's esophagus     "never dilated" (06/10/2013)  . Diverticulosis   . Colon polyps     tubular adenoma-last colon 12/18/2005  . Hiatal hernia   . Depression   . Hematoma     on mid back  . Hypothyroidism     takes synthroid  . GERD (gastroesophageal reflux disease)     takes omeprazole  . Peripheral vascular disease   . High cholesterol   . Arthritis     "legs and feet" (06/10/2013)    Social  History Social History  Substance Use Topics  . Smoking status: Former Smoker -- 1.00 packs/day for 20 years    Types: Cigarettes    Quit date: 06/28/1985  . Smokeless tobacco: Never Used  . Alcohol Use: No     Comment: 06/10/2013 "drank recreationally when I did drink; stopped ~ 2010"    Family History Family History  Problem Relation Age of Onset  . Colon cancer Neg Hx   . Heart disease Mother   . Lung cancer Brother     and sister  . Cancer Brother   . Melanoma Father   . Cancer Father   . Alzheimer's disease Sister     x 2  . Heart disease Sister   . Hypertension Sister     Past Surgical History  Procedure Laterality Date  . Transurethral resection of prostate    . Tonsillectomy    . Pyloroplasty    . Polypectomy      stomach  . Colonoscopy    . Upper gastrointestinal endoscopy    . Cardiac catheterization    . Cataract extraction w/ intraocular lens  implant, bilateral Bilateral   . Eye surgery    . Femoral-popliteal bypass graft Left 11/27/2012    Procedure: BYPASS GRAFT LEFT ABOVE KNEE TO BELOW KNEE POPLITEAL ARTERY USING LEFT NONREVERSED GREATER SAPPHENOUS VEIN;  Surgeon: Serafina Mitchell, MD;  Location: [facility];  Service: Vascular;  Laterality: Left;  . I&d extremity Left 06/10/2013    leg; "from the OR in 11/2012"  . I&d extremity Left 06/10/2013    Procedure: IRRIGATION AND DEBRIDEMENT OF LEFT UPPER LEG;  Surgeon: Serafina Mitchell, MD;  Location: [facility];  Service: Vascular;  Laterality: Left;  . Lower extremity angiogram N/A 10/01/2012    Procedure: LOWER EXTREMITY ANGIOGRAM;  Surgeon: Jettie Booze, MD;  Location: [facility] CATH LAB;  Service: Cardiovascular;  Laterality: N/A;    Allergies  Allergen Reactions  . Penicillins Other (See Comments)    Boils    Current Outpatient Prescriptions  Medication Sig Dispense Refill  . acetaminophen (TYLENOL) 325 MG tablet Take 650 mg by mouth at bedtime.     Marland Kitchen amLODipine (NORVASC) 5 MG tablet Take 5 mg by mouth at  bedtime.     Marland Kitchen atorvastatin (LIPITOR) 40 MG tablet Take 40 mg by mouth at bedtime.     . clopidogrel (PLAVIX) 75 MG tablet Take 1 tablet by mouth  daily 90 tablet 3  . levothyroxine (SYNTHROID, LEVOTHROID) 125 MCG tablet Take 125 mcg by mouth at bedtime.     . Multiple Vitamins-Iron (CHLORELLA PO) Take 1 tablet by mouth daily as needed (meal supplement).    Marland Kitchen omeprazole (PRILOSEC) 40 MG capsule Take 1 capsule by mouth  daily 90 capsule 1  . OVER THE COUNTER MEDICATION Place 2 sprays into both nostrils at bedtime. Over the counter nasal spray.     No current facility-administered medications for this visit.    ROS: See HPI for pertinent positives and negatives.   Physical Examination  Filed Vitals:   12/13/14 0956 12/13/14 0958  BP: 148/74 139/69  Pulse: 52   Temp: 97.1 F (36.2 C)   TempSrc: Oral   Height: 5\' 9"  (1.753 m)   Weight: 191 lb 8 oz (86.864 kg)   SpO2: 98%    Body mass index is 28.27 kg/(m^2).   General: A&O x 3, WDWN. Gait: normal Eyes: no gross abnormalites Pulmonary: Respirations are non labored.  Cardiac: regular Rythm   Radial pulses: are 2+ palpable and =.   VASCULAR EXAM: Extremities without ischemic changes  without Gangrene; without open wounds.     LE Pulses LEFT RIGHT   FEMORAL 2+ palpable 1+ palpable    POPLITEAL not palpable  not palpable   POSTERIOR TIBIAL not palpable  not palpable    DORSALIS PEDIS  ANTERIOR TIBIAL 2+ palpable  not palpable    Abdomen: soft, NT, no palpable masses. Skin: no rashes, no ulcers. Musculoskeletal: no muscle wasting or atrophy. Neurologic: A&O X 3; Appropriate Affect ; SENSATION: normal; MOTOR FUNCTION: moving all extremities equally. Speech is fluent/normal. CN 2-12 intact.                Non-Invasive Vascular Imaging: DATE: 12/13/2014 LOWER EXTREMITY ARTERIAL DUPLEX EVALUATION    INDICATION: Peripheral vascular disease    PREVIOUS INTERVENTION(S): Left above knee to below knee popliteal bypass graft 11/27/2012; I & D left leg seroma 06/10/2013    DUPLEX EXAM:     RIGHT  LEFT   Peak Systolic Velocity (cm/s) Ratio (if abnormal) Waveform  Peak Systolic Velocity (cm/s) Ratio (if abnormal) Waveform     Inflow Artery 97  T     Proximal Anastomosis 94  B     Proximal Graft 75  B     Mid Graft 74  B      Distal Graft 53  T  Distal Anastomosis 52  T     Outflow Artery 73/48/103/79  T/B/T/B  0.73/0.65 Today's ABI / TBI 0.94/0.72  0.69/0.46 Previous ABI / TBI (05/02/2014  ) 1.15/0.74    Waveform:    M - Monophasic       B - Biphasic       T - Triphasic  If Ankle Brachial Index (ABI) or Toe Brachial Index (TBI) performed, please see complete report     ADDITIONAL FINDINGS:     IMPRESSION: Patent left above knee to below knee popliteal bypass graft, no hyperplasia or hemodynamically significant plaque present.    Compared to the previous exam:  Stable right and slightly decreased left ankle brachial index since study on 05/02/2014.     ASSESSMENT: Walter [name] is a 81 y.o. male who is s/p left femoral popliteal bypass graft using non reversed greater saphenous vein on 11/27/12. His risk factors for PAD include former smoker until 1981 and exposure to chronic second hand smoke in his office until 2005. Fortunately he does not have DM.  He has no claudication with walking, no signs of ischemia in his lower extremities. He has not been walking much, has no barriers to walking; states he will join a gym and walk on a treadmill several days/week. Today's left LE arterial duplex suggests a patent left above knee to below knee popliteal bypass graft, no hyperplasia or hemodynamically significant plaque. Stable right and slightly decreased left ankle  brachial index since study on 05/02/2014. Right ABI waveforms are monophasic, left are triphasic.    PLAN:  Graduated walking program discussed in detail. Based on the patient's vascular studies and examination, pt will return to clinic in 1 year with ABI's and left LE arterial Duplex according to post operative surveillance timeline.   I discussed in depth with the patient the nature of atherosclerosis, and emphasized the importance of maximal medical management including strict control of blood pressure, blood glucose, and lipid levels, obtaining regular exercise, and continued cessation of smoking.  The patient is aware that without maximal medical management the underlying atherosclerotic disease process will progress, limiting the benefit of any interventions.  The patient was given information about PAD including signs, symptoms, treatment, what symptoms should prompt the patient to seek immediate medical care, and risk reduction measures to take.  Clemon Chambers, RN, MSN, FNP-C [facility] Phone: [phone]  Clinic MD: Early  12/13/2014 9:43 AM

## 2014-12-13 NOTE — Addendum Note (Signed)
Addended by: Thresa Ross C on: 12/13/2014 01:58 PM   Modules accepted: Orders

## 2015-02-11 ENCOUNTER — Other Ambulatory Visit: Payer: Self-pay | Admitting: Internal Medicine

## 2015-04-05 ENCOUNTER — Telehealth: Payer: Self-pay | Admitting: Internal Medicine

## 2015-04-05 MED ORDER — OMEPRAZOLE 40 MG PO CPDR: DELAYED_RELEASE_CAPSULE | ORAL | Status: DC

## 2015-04-05 NOTE — Telephone Encounter (Signed)
Resent Omeprazole to [facility]

## 2015-06-17 ENCOUNTER — Other Ambulatory Visit: Payer: Self-pay | Admitting: Internal Medicine

## 2015-06-17 ENCOUNTER — Other Ambulatory Visit: Payer: Self-pay | Admitting: Interventional Cardiology

## 2015-06-27 IMAGING — CR DG CHEST 2V
2 series · 2 of 2 positions shown · non-contrast
Comparison: Chest x-ray of 03/14/2008

CLINICAL DATA: Preop for fem-pop bypass graft, former smoking
history

EXAM:
CHEST  2 VIEW

[w chest pa]
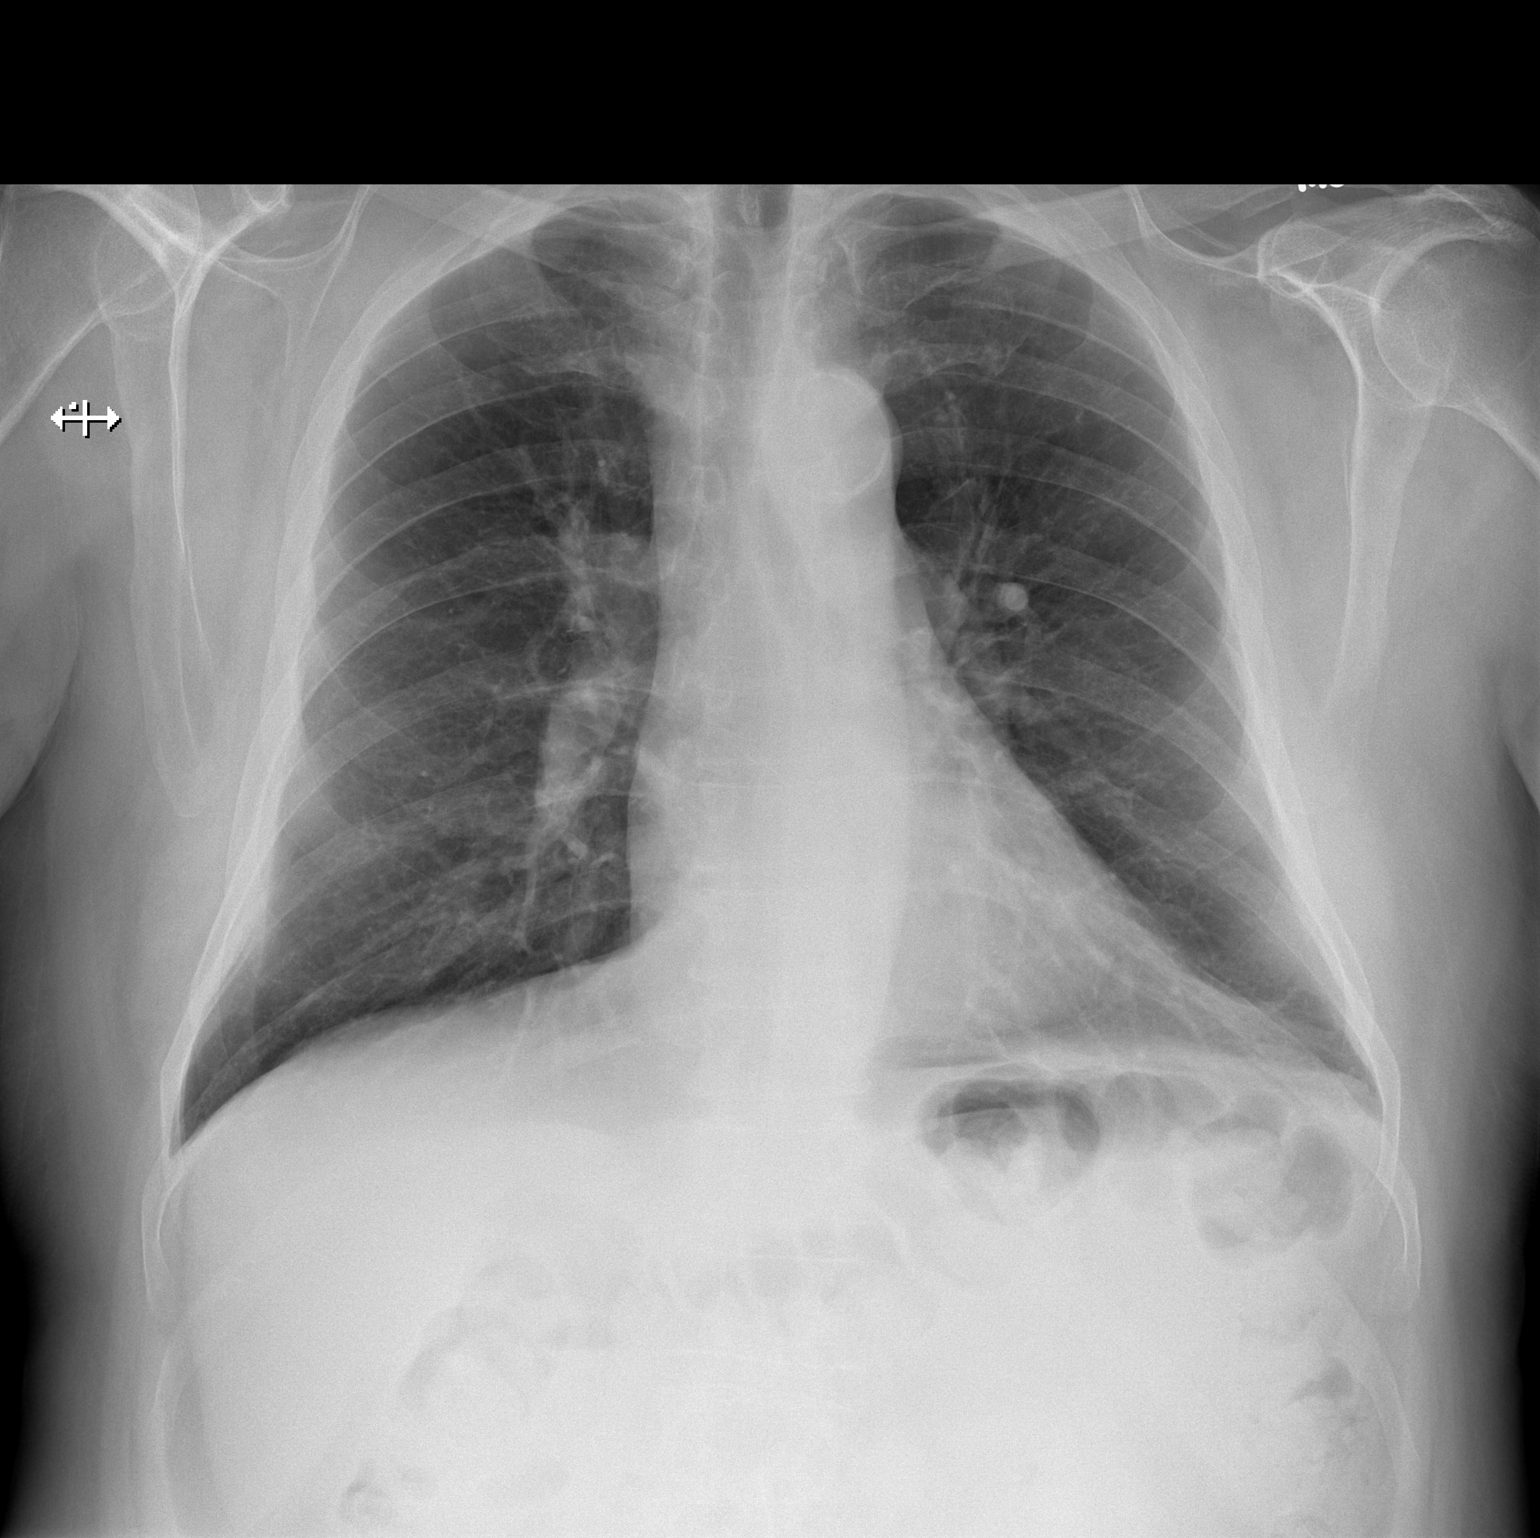

[w chest lat]
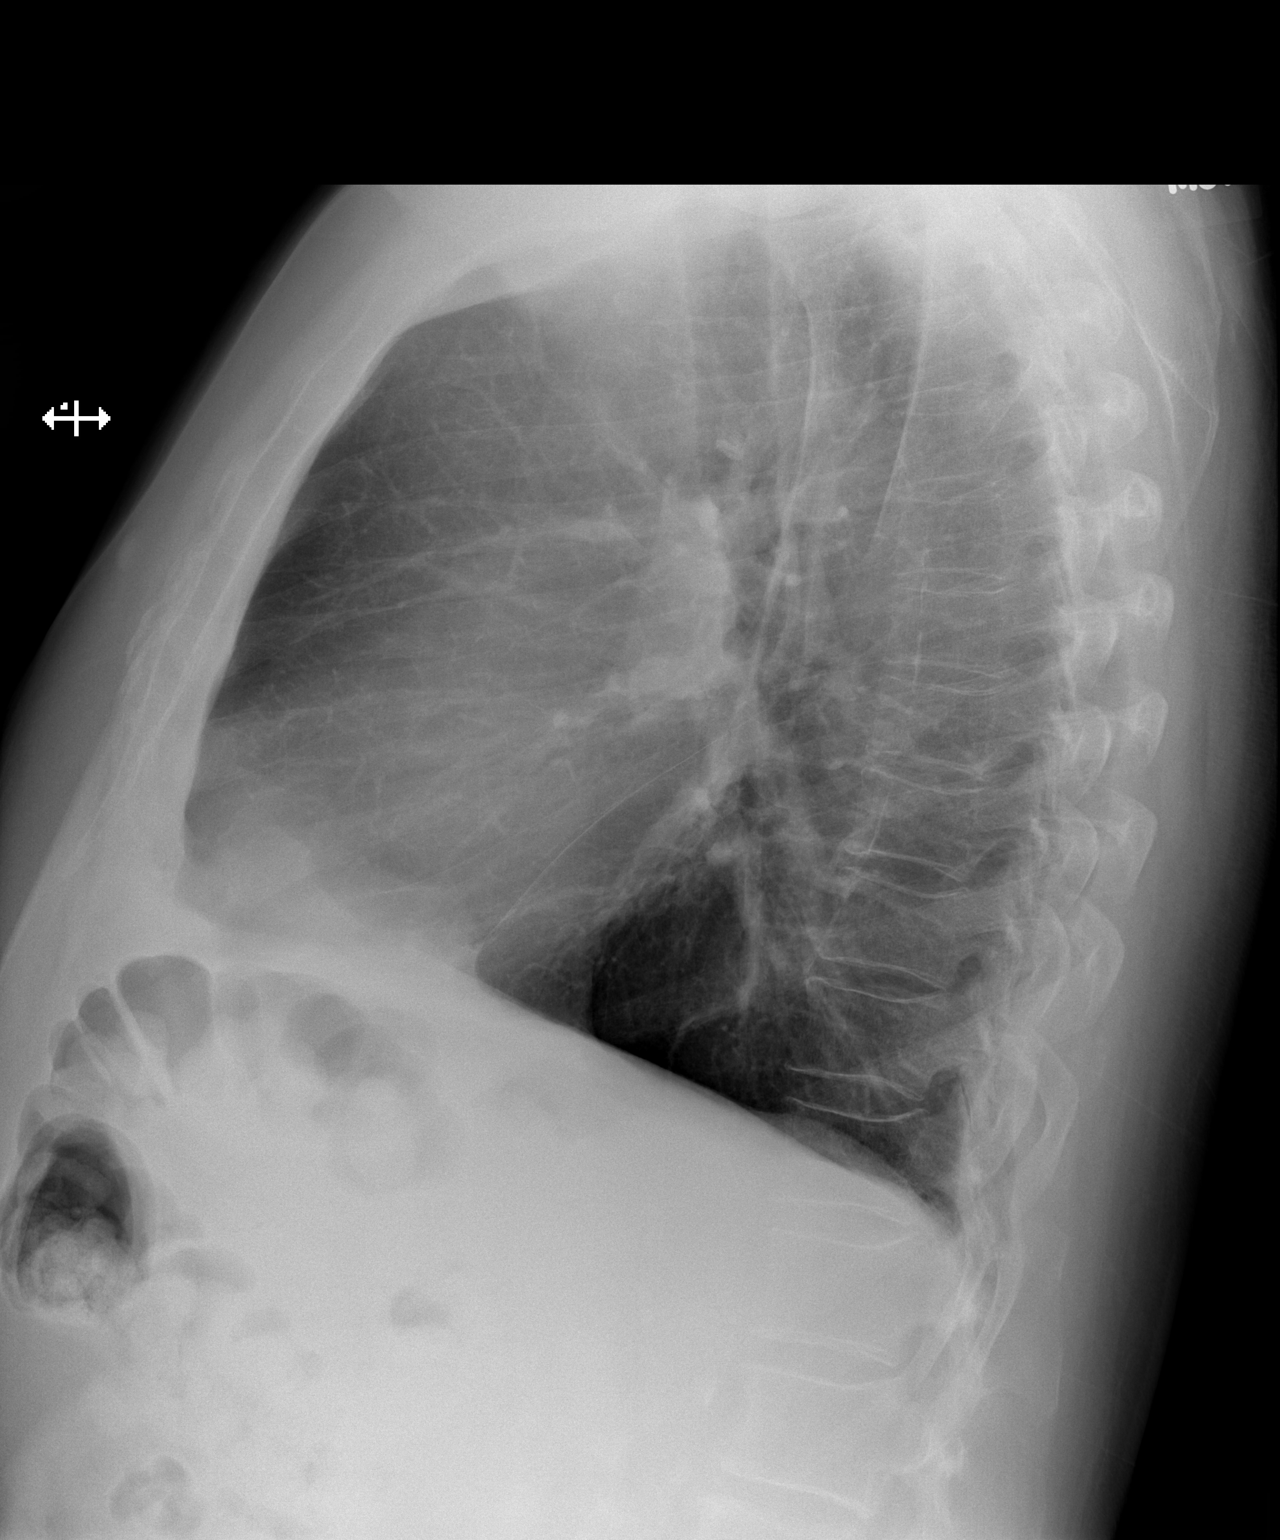

[2 of 2 positions shown; findings below may reference images not displayed]

FINDINGS: The lungs are clear but hyperaerated. Mediastinal contours are
stable. The heart is within normal limits in size. No acute bony
abnormality is seen.
IMPRESSION: Stable chest x-ray with hyper aeration.  No active lung disease.

## 2015-08-30 ENCOUNTER — Encounter: Payer: Self-pay | Admitting: Internal Medicine

## 2015-10-18 ENCOUNTER — Telehealth: Payer: Self-pay

## 2015-10-18 ENCOUNTER — Encounter: Payer: Self-pay | Admitting: Internal Medicine

## 2015-10-18 ENCOUNTER — Encounter (INDEPENDENT_AMBULATORY_CARE_PROVIDER_SITE_OTHER): Payer: Self-pay

## 2015-10-18 ENCOUNTER — Ambulatory Visit (INDEPENDENT_AMBULATORY_CARE_PROVIDER_SITE_OTHER): Payer: Medicare Other | Admitting: Internal Medicine

## 2015-10-18 VITALS — BP 118/68 | HR 58 | Ht 69.0 in | Wt 188.0 lb

## 2015-10-18 DIAGNOSIS — Z860100 Personal history of colon polyps, unspecified: Secondary | ICD-10-CM

## 2015-10-18 DIAGNOSIS — K219 Gastro-esophageal reflux disease without esophagitis: Secondary | ICD-10-CM

## 2015-10-18 DIAGNOSIS — K5909 Other constipation: Secondary | ICD-10-CM | POA: Diagnosis not present

## 2015-10-18 DIAGNOSIS — K2270 Barrett's esophagus without dysplasia: Secondary | ICD-10-CM | POA: Diagnosis not present

## 2015-10-18 MED ORDER — NA SULFATE-K SULFATE-MG SULF 17.5-3.13-1.6 GM/177ML PO SOLN: 1.0000 | Freq: Once | ORAL | 0 refills | Status: AC

## 2015-10-18 NOTE — Patient Instructions (Signed)
You have been scheduled for a colonoscopy. Please follow written instructions given to you at your visit today.  Please pick up your prep supplies at the pharmacy within the next 1-3 days. If you use inhalers (even only as needed), please bring them with you on the day of your procedure. Your physician has requested that you go to www.startemmi.com and enter the access code given to you at your visit today. This web site gives a general overview about your procedure. However, you should still follow specific instructions given to you by our office regarding your preparation for the procedure.   Try the Linzess samples - one tablet 30 minutes before your first meal of the day.  If this works well, call us and I'll send in a prescription.

## 2015-10-18 NOTE — Telephone Encounter (Signed)
  10/18/2015   RE: Walter [name] DOB: 1933-01-27 MRN: [id number]   Dear Dr. Irish Lack,    We have scheduled the above patient for an endoscopic procedure. Our records show that he is on anticoagulation therapy.   Please advise as to how long the patient may come off his therapy of Plavix prior to the procedure, which is scheduled for 11/10/2015.  Please fax back/ or route the completed form to [facility] at [phone].   Sincerely,    Phillis Haggis

## 2015-10-18 NOTE — Telephone Encounter (Signed)
Please verify that he has not had any cardiac events since his last visit.  If no events, he can hold his Plavix for 5 days prior to the procedure.

## 2015-10-18 NOTE — Progress Notes (Signed)
HISTORY OF PRESENT ILLNESS:  Walter [name] is a 82 y.o. male with hypertension, hypothyroidism, anxiety, GERD complicated by Barrett's esophagus with low-grade dysplasia, adenomatous colon polyps, and chronic functional abdominal complaints. Patient was last evaluated in the office 10/02/2010. See that dictation. His last complete colonoscopy was July 2012. He has severe diverticulosis. Follow-up in 5 years recommended. His last upper endoscopy was in August 2015. That examination and one previous in 2013 were without dysplasia or atypia. Follow-up in 3 years recommended. Patient presents today with a number of complaints. First, intermittent problems with epigastric discomfort relieved with an acid. Otherwise no reflux symptoms or dysphagia on omeprazole 40 mg daily. Next, problems with constipation. Associated with constipation is significant lower abdominal discomfort. Discomfort is initially made worse after defecation that improves. MiraLAX was not effective. Patient is on Plavix for peripheral vascular disease. He acknowledges being due for surveillance colonoscopy and is interested in such. It should be noted that he had a CT scan of the abdomen and pelvis May 2016 to evaluate abdominal pain with nausea vomiting and diarrhea. He was found to have inflammatory changes of the distal small bowel and colon.  REVIEW OF SYSTEMS:  All non-GI ROS negative except for back pain, sinus allergy,  Past Medical History:  Diagnosis Date  . Anxiety   . Arthritis    "legs and feet" (06/10/2013)  . Barrett's esophagus    "never dilated" (06/10/2013)  . Colon polyps    tubular adenoma-last colon 12/18/2005  . Depression   . Diverticulosis   . GERD (gastroesophageal reflux disease)    takes omeprazole  . Hematoma    on mid back  . Hiatal hernia   . High cholesterol   . Hypertension   . Hypothyroidism    takes synthroid  . Peripheral vascular disease [facility])     Past Surgical History:  Procedure  Laterality Date  . CARDIAC CATHETERIZATION    . CATARACT EXTRACTION W/ INTRAOCULAR LENS  IMPLANT, BILATERAL Bilateral   . COLONOSCOPY    . EYE SURGERY    . FEMORAL-POPLITEAL BYPASS GRAFT Left 11/27/2012   Procedure: BYPASS GRAFT LEFT ABOVE KNEE TO BELOW KNEE POPLITEAL ARTERY USING LEFT NONREVERSED GREATER SAPPHENOUS VEIN;  Surgeon: Serafina Mitchell, MD;  Location: [place] OR;  Service: Vascular;  Laterality: Left;  . I&D EXTREMITY Left 06/10/2013   leg; "from the OR in 11/2012"  . I&D EXTREMITY Left 06/10/2013   Procedure: IRRIGATION AND DEBRIDEMENT OF LEFT UPPER LEG;  Surgeon: Serafina Mitchell, MD;  Location: [place];  Service: Vascular;  Laterality: Left;  . LOWER EXTREMITY ANGIOGRAM N/A 10/01/2012   Procedure: LOWER EXTREMITY ANGIOGRAM;  Surgeon: Jettie Booze, MD;  Location: [facility] CATH LAB;  Service: Cardiovascular;  Laterality: N/A;  . POLYPECTOMY     stomach  . PYLOROPLASTY    . TONSILLECTOMY    . TRANSURETHRAL RESECTION OF PROSTATE    . UPPER GASTROINTESTINAL ENDOSCOPY      Social History Walter [name]  reports that he quit smoking about 30 years ago. His smoking use included Cigarettes. He has a 20.00 pack-year smoking history. He has never used smokeless tobacco. He reports that he does not drink alcohol or use drugs.  family history includes Alzheimer's disease in his sister; Cancer in his brother and father; Heart disease in his mother and sister; Hypertension in his sister; Lung cancer in his brother; Melanoma in his father.  Allergies  Allergen Reactions  . Penicillins Other (See Comments)  Boils       PHYSICAL EXAMINATION: Vital signs: BP 118/68   Pulse (!) 58   Ht 5\' 9"  (1.753 m)   Wt 188 lb (85.3 kg)   BMI 27.76 kg/m   Constitutional: generally well-appearing, no acute distress Psychiatric: alert and oriented x3, cooperative Eyes: extraocular movements intact, anicteric, conjunctiva pink Mouth: oral pharynx moist, no lesions. No thrush Neck: suppleWithout  thyromegaly Lymph: no supraclavicular lymphadenopathy Cardiovascular: heart regular rate and rhythm, no murmur Lungs: clear to auscultation bilaterally Abdomen: soft, nontender, nondistended, no obvious ascites, no peritoneal signs, normal bowel sounds, no organomegaly Rectal:Deferred until colonoscopy Extremities: no clubbing cyanosis or lower extremity edema bilaterally Skin: no lesions on visible extremities Neuro: No focal deficits. Cranial nerves intact. No asterixis.   ASSESSMENT:  #1. Constipation. Likely functional #2. Lower abdominal pain. Likely secondary to constipation. CT scan last year as described  #3. History of adenomatous colon polyps on colonoscopy 2007. Negative exam 2012 with severe diverticular change throughout #4. GERD, complicated by Barrett's esophagus. History of low-grade dysplasia and atypia with subsequent negative exams 2. Last examination August 2015 #5. Multiple medical problems including peripheral vascular disease for which she is on Plavix  PLAN:  #1. Schedule surveillance colonoscopy. The patient is high risk given the need to address Plavix therapy.The nature of the procedure, as well as the risks, benefits, and alternatives were carefully and thoroughly reviewed with the patient. Ample time for discussion and questions allowed. The patient understood, was satisfied, and agreed to proceed. #2. Reflux precautions #3. Continue PPI #4. Prescribe linzess 290 g daily for constipation and abdominal pain #5. Hold Plavix 1 week prior to colonoscopy. Confirm with patient's cardiologist that this is acceptable #6. Surveillance upper endoscopy next August

## 2015-10-19 NOTE — Telephone Encounter (Signed)
**Note De-Identified Walter [name] Obfuscation** The pt states that he has had no cardiac events since his last OV with Dr Irish Lack. He denies CP/discomfort, SOB, dizziness, syncope, fatigue or weakness at this time.   Will forward message to Julieanne Cotton.

## 2015-10-19 NOTE — Telephone Encounter (Signed)
LMTCB

## 2015-10-20 ENCOUNTER — Telehealth: Payer: Self-pay | Admitting: Internal Medicine

## 2015-10-20 MED ORDER — NA SULFATE-K SULFATE-MG SULF 17.5-3.13-1.6 GM/177ML PO SOLN: ORAL | 0 refills | Status: DC

## 2015-10-20 NOTE — Telephone Encounter (Signed)
rx sent Patient notified 

## 2015-10-23 NOTE — Telephone Encounter (Signed)
Spoke with patient and told him that, per Dr. Irish Lack, he could hold his Plavix for 5 days prior to his procedure. Patient agreed.

## 2015-10-26 ENCOUNTER — Telehealth: Payer: Self-pay | Admitting: Internal Medicine

## 2015-10-30 NOTE — Telephone Encounter (Signed)
Suprep sent to [facility] by mistake.  They have cancelled the order.

## 2015-10-31 ENCOUNTER — Encounter: Payer: Self-pay | Admitting: Internal Medicine

## 2015-11-03 ENCOUNTER — Telehealth: Payer: Self-pay

## 2015-11-03 NOTE — Telephone Encounter (Signed)
Patient wanted to be reminded of how long to hold his Plavix prior to his procedure.  I clarified that it was five days.  Patient understood.

## 2015-11-10 ENCOUNTER — Encounter: Payer: Medicare Other | Admitting: Internal Medicine

## 2015-12-09 ENCOUNTER — Other Ambulatory Visit: Payer: Self-pay | Admitting: Interventional Cardiology

## 2015-12-13 ENCOUNTER — Encounter: Payer: Self-pay | Admitting: Family

## 2015-12-18 ENCOUNTER — Ambulatory Visit (INDEPENDENT_AMBULATORY_CARE_PROVIDER_SITE_OTHER): Payer: Medicare Other | Admitting: Family

## 2015-12-18 ENCOUNTER — Ambulatory Visit (HOSPITAL_COMMUNITY)
Admission: RE | Admit: 2015-12-18 | Discharge: 2015-12-18 | Disposition: A | Discharge status: Home / Self Care | Payer: Medicare Other | Source: Ambulatory Visit | Attending: Family | Admitting: Family

## 2015-12-18 ENCOUNTER — Encounter: Payer: Self-pay | Admitting: Family

## 2015-12-18 VITALS — BP 137/80 | HR 59 | Wt 185.0 lb

## 2015-12-18 DIAGNOSIS — I739 Peripheral vascular disease, unspecified: Secondary | ICD-10-CM

## 2015-12-18 DIAGNOSIS — Z95828 Presence of other vascular implants and grafts: Secondary | ICD-10-CM

## 2015-12-18 DIAGNOSIS — Z48812 Encounter for surgical aftercare following surgery on the circulatory system: Secondary | ICD-10-CM

## 2015-12-18 DIAGNOSIS — I779 Disorder of arteries and arterioles, unspecified: Secondary | ICD-10-CM

## 2015-12-18 DIAGNOSIS — I7389 Other specified peripheral vascular diseases: Secondary | ICD-10-CM | POA: Diagnosis not present

## 2015-12-18 DIAGNOSIS — Z87891 Personal history of nicotine dependence: Secondary | ICD-10-CM

## 2015-12-18 NOTE — Progress Notes (Signed)
[facility] [place]   CC: Follow up peripheral artery occlusive disease  History of Present Illness Walter [name] is a 82 y.o. male patient of Dr. Kellie Simmering who is s/p left femoral popliteal bypass graft using non reversed greater saphenous vein on 11/27/12 by Dr. Trula Slade.  He is also s/p I&D left leg, general anesthesia, by Dr. Trula Slade on 06/10/13. He returns today for follow up. He no longer has lymphocele problems in his left thigh since the I&D. Pt states he no longer feels the generalized bad feeling.  He had therapy for right rotator cuff issue.  He walked a great deal on his job, has been retired a few years, states walking did not help his circulation since he developed the LE arterial blockages; however, with further investigation, he smoked for 20 years until 1981 then was exposed to second hand smoke in his office chronically until about 2005 in his office.  Since his last visit he has been using a treadmill several x/week.  He started having low back pain a few months ago, made worse by walking, denies claudication sx's with walking, denies radiating pain in his legs with walking.  His back does not hurt when he uses a grocery cart to walk.   He denies any history of stroke or TIA.  Pt Diabetic: No Pt smoker: former smoker, quit in 1981, then exposed to second hand smoke until about 2005 in his office.  Pt meds include: Statin :Yes Betablocker: no ASA: No, pt states he cannot take due to stage 3 CRF Other anticoagulants/antiplatelets: Plavix    Past Medical History:  Diagnosis Date  . Anxiety   . Arthritis    "legs and feet" (06/10/2013)  . Barrett's esophagus    "never dilated" (06/10/2013)  . Colon polyps    tubular adenoma-last colon 12/18/2005  . Depression   . Diverticulosis   . GERD (gastroesophageal reflux disease)    takes omeprazole  . Hematoma    on mid back  . Hiatal hernia   . High cholesterol   . Hypertension   .  Hypothyroidism    takes synthroid  . Peripheral vascular disease [facility])     Social History Social History  Substance Use Topics  . Smoking status: Former Smoker    Packs/day: 1.00    Years: 20.00    Types: Cigarettes    Quit date: 06/28/1985  . Smokeless tobacco: Never Used  . Alcohol use No     Comment: 06/10/2013 "drank recreationally when I did drink; stopped ~ 2010"    Family History Family History  Problem Relation Age of Onset  . Heart disease Mother   . Lung cancer Brother     and sister  . Cancer Brother   . Melanoma Father   . Cancer Father   . Alzheimer's disease Sister     x 2  . Heart disease Sister   . Hypertension Sister   . Colon cancer Neg Hx     Past Surgical History:  Procedure Laterality Date  . CARDIAC CATHETERIZATION    . CATARACT EXTRACTION W/ INTRAOCULAR LENS  IMPLANT, BILATERAL Bilateral   . COLONOSCOPY    . EYE SURGERY    . FEMORAL-POPLITEAL BYPASS GRAFT Left 11/27/2012   Procedure: BYPASS GRAFT LEFT ABOVE KNEE TO BELOW KNEE POPLITEAL ARTERY USING LEFT NONREVERSED GREATER SAPPHENOUS VEIN;  Surgeon: Serafina Mitchell, MD;  Location: [place];  Service: Vascular;  Laterality: Left;  . I&D EXTREMITY Left 06/10/2013  leg; "from the OR in 11/2012"  . I&D EXTREMITY Left 06/10/2013   Procedure: IRRIGATION AND DEBRIDEMENT OF LEFT UPPER LEG;  Surgeon: Serafina Mitchell, MD;  Location: [place];  Service: Vascular;  Laterality: Left;  . LOWER EXTREMITY ANGIOGRAM N/A 10/01/2012   Procedure: LOWER EXTREMITY ANGIOGRAM;  Surgeon: Jettie Booze, MD;  Location: [facility] CATH LAB;  Service: Cardiovascular;  Laterality: N/A;  . POLYPECTOMY     stomach  . PYLOROPLASTY    . TONSILLECTOMY    . TRANSURETHRAL RESECTION OF PROSTATE    . UPPER GASTROINTESTINAL ENDOSCOPY      Allergies  Allergen Reactions  . Penicillins Other (See Comments)    Boils    Current Outpatient Prescriptions  Medication Sig Dispense Refill  . acetaminophen (TYLENOL) 325 MG tablet Take 650 mg by  mouth at bedtime.     Marland Kitchen amLODipine (NORVASC) 5 MG tablet Take 5 mg by mouth at bedtime.     Marland Kitchen atorvastatin (LIPITOR) 40 MG tablet Take 40 mg by mouth at bedtime.     . clopidogrel (PLAVIX) 75 MG tablet TAKE 1 TABLET BY MOUTH  DAILY 15 tablet 0  . levothyroxine (SYNTHROID, LEVOTHROID) 125 MCG tablet Take 125 mcg by mouth at bedtime.     . Multiple Vitamins-Iron (CHLORELLA PO) Take 1 tablet by mouth daily as needed (meal supplement).    . Na Sulfate-K Sulfate-Mg Sulf 17.5-3.13-1.6 GM/180ML SOLN Take as directed per prep instructions 354 mL 0  . omeprazole (PRILOSEC) 40 MG capsule Take 1 capsule by mouth  daily 90 capsule 0  . OVER THE COUNTER MEDICATION Place 2 sprays into both nostrils at bedtime. Over the counter nasal spray.     No current facility-administered medications for this visit.     ROS: See HPI for pertinent positives and negatives.   Physical Examination  Vitals:   12/18/15 1556  BP: 137/80  Pulse: (!) 59  SpO2: 98%  Weight: 185 lb (83.9 kg)   Body mass index is 27.32 kg/m.  General: A&O x 3, WDWN. Gait: normal Eyes: no gross abnormalites Pulmonary: Respirations are non labored.  Cardiac: regular rythm   Radial pulses: are 2+ palpable and =.   VASCULAR EXAM: Extremities without ischemic changes  without Gangrene; without open wounds.     LE Pulses LEFT RIGHT   FEMORAL 2+ palpable 1+ palpable    POPLITEAL not palpable  not palpable   POSTERIOR TIBIAL not palpable  not palpable    DORSALIS PEDIS  ANTERIOR TIBIAL 2+ palpable  faintly palpable    Abdomen: soft, NT, no palpable masses. Skin: no rashes, no ulcers. Musculoskeletal: no muscle wasting or atrophy. Neurologic: A&O X 3; Appropriate Affect ; SENSATION: normal;  MOTOR FUNCTION: moving all extremities equally. Speech is fluent/normal. CN 2-12 intact.     ASSESSMENT: [name] is a 82 y.o. male who is s/p left femoral popliteal bypass graft using non reversed greater saphenous vein on 11/27/12. His risk factors for PAD include former smoker until 1981 and exposure to chronic second hand smoke in his office until 2005. Fortunately he does not have DM.  He has no claudication with walking, no signs of ischemia in his lower extremities. He has increased his walking since his last visit.  DATA Today's left LE arterial duplex suggests a patent left above knee to below knee popliteal bypass graft, no hyperplasia or hemodynamically significant plaque. Stable right, today 0.75, prior 0.73;  and slightly decreased left ankle brachial index , today 0.90,  prior 0.94, since study on 12-13-2014. Right ABI waveforms are monophasic, left are bi and triphasic.  Both TBI's are normal however: right is 0.75 and left is 0.86.   PLAN: Graduated walking program discussed and how to achieve.  Based on the patient's vascular studies and examination, pt will return to clinic in 6 months with ABI's and left LE arterial duplex.   I discussed in depth with the patient the nature of atherosclerosis, and emphasized the importance of maximal medical management including strict control of blood pressure, blood glucose, and lipid levels, obtaining regular exercise, and continued cessation of smoking.  The patient is aware that without maximal medical management the underlying atherosclerotic disease process will progress, limiting the benefit of any interventions.  The patient was given information about PAD including signs, symptoms, treatment, what symptoms should prompt the patient to seek immediate medical care, and risk reduction measures to take.  Clemon Chambers, RN, MSN, FNP-C [facility] Phone: [phone]  Clinic MD:  Trula Slade  12/18/15 3:58 PM

## 2015-12-18 NOTE — Patient Instructions (Signed)

## 2016-01-31 ENCOUNTER — Other Ambulatory Visit: Payer: Self-pay | Admitting: Internal Medicine

## 2016-02-16 ENCOUNTER — Other Ambulatory Visit: Payer: Self-pay | Admitting: *Deleted

## 2016-02-16 MED ORDER — CLOPIDOGREL BISULFATE 75 MG PO TABS: 75.0000 mg | ORAL_TABLET | Freq: Every day | ORAL | 0 refills | Status: DC

## 2016-04-01 ENCOUNTER — Telehealth: Payer: Self-pay | Admitting: Internal Medicine

## 2016-04-01 NOTE — Telephone Encounter (Signed)
Pt was scheduled for a colon in November but cancelled the appt due to not having transportation for the procedure. Pt calling to reschedule procedure. He is currently taking plavix. Does pt need another OV prior to scheduling procedure? Please advise.

## 2016-04-01 NOTE — Telephone Encounter (Signed)
Okay to schedule. Does not need office visit. Hold Plavix 1 week prior if okay with his cardiologist (confirmed via correspondence). Continue aspirin throughout if he takes. Thanks

## 2016-04-04 ENCOUNTER — Encounter: Payer: Self-pay | Admitting: Internal Medicine

## 2016-04-10 NOTE — Telephone Encounter (Signed)
Pt is scheduled for Colonoscopy with Dr. Henrene Pastor 06/28/16. Please advise if ok to hold pts Plavix for 5 days prior to procedure.

## 2016-04-11 NOTE — Telephone Encounter (Signed)
I have not seen him in over 2 years.  He is now managed by [facility] for his peripheral vascular disease.

## 2016-04-15 NOTE — Telephone Encounter (Signed)
Please advise regarding if ok for pt to hold plavix for 5 days prior to colonoscopy.

## 2016-06-14 ENCOUNTER — Ambulatory Visit (AMBULATORY_SURGERY_CENTER): Payer: Self-pay | Admitting: *Deleted

## 2016-06-14 VITALS — Ht 69.0 in | Wt 190.6 lb

## 2016-06-14 DIAGNOSIS — Z86010 Personal history of colonic polyps: Secondary | ICD-10-CM

## 2016-06-14 NOTE — Progress Notes (Signed)
No egg or soy allergy known to patient  No issues with past sedation with any surgeries  or procedures, no intubation problems  No diet pills per patient No home 02 use per patient  blood thinners per patient - pt. Instructed to hold Plavix x 5 days per Vinnie Level Nickel, NP Pt denies issues with constipation-not currently No A fib or A flutter  EMMI video sent to pt's e mail - pt. declined

## 2016-06-19 ENCOUNTER — Encounter: Payer: Self-pay | Admitting: Family

## 2016-06-20 NOTE — Addendum Note (Signed)
Addended by: Lianne Cure A on: 06/20/2016 10:40 AM   Modules accepted: Orders

## 2016-06-24 ENCOUNTER — Ambulatory Visit (HOSPITAL_COMMUNITY)
Admission: RE | Admit: 2016-06-24 | Discharge: 2016-06-24 | Disposition: A | Discharge status: Home / Self Care | Payer: Medicare Other | Source: Ambulatory Visit | Attending: Surgery | Admitting: Surgery

## 2016-06-24 ENCOUNTER — Ambulatory Visit (INDEPENDENT_AMBULATORY_CARE_PROVIDER_SITE_OTHER)
Admission: RE | Admit: 2016-06-24 | Discharge: 2016-06-24 | Disposition: A | Discharge status: Home / Self Care | Payer: Medicare Other | Source: Ambulatory Visit | Attending: Surgery | Admitting: Surgery

## 2016-06-24 DIAGNOSIS — R0989 Other specified symptoms and signs involving the circulatory and respiratory systems: Secondary | ICD-10-CM | POA: Diagnosis present

## 2016-06-24 DIAGNOSIS — R938 Abnormal findings on diagnostic imaging of other specified body structures: Secondary | ICD-10-CM | POA: Diagnosis not present

## 2016-06-24 DIAGNOSIS — I10 Essential (primary) hypertension: Secondary | ICD-10-CM | POA: Diagnosis not present

## 2016-06-24 DIAGNOSIS — Z87891 Personal history of nicotine dependence: Secondary | ICD-10-CM | POA: Diagnosis not present

## 2016-06-24 DIAGNOSIS — E785 Hyperlipidemia, unspecified: Secondary | ICD-10-CM | POA: Diagnosis not present

## 2016-06-24 DIAGNOSIS — I739 Peripheral vascular disease, unspecified: Secondary | ICD-10-CM | POA: Diagnosis not present

## 2016-06-24 DIAGNOSIS — Z95828 Presence of other vascular implants and grafts: Secondary | ICD-10-CM

## 2016-06-24 DIAGNOSIS — I779 Disorder of arteries and arterioles, unspecified: Secondary | ICD-10-CM | POA: Insufficient documentation

## 2016-06-28 ENCOUNTER — Encounter: Payer: Self-pay | Admitting: Internal Medicine

## 2016-06-28 ENCOUNTER — Encounter: Payer: Medicare Other | Admitting: Internal Medicine

## 2016-07-01 ENCOUNTER — Encounter: Payer: Self-pay | Admitting: Family

## 2016-07-01 ENCOUNTER — Ambulatory Visit (INDEPENDENT_AMBULATORY_CARE_PROVIDER_SITE_OTHER): Payer: Medicare Other | Admitting: Family

## 2016-07-01 VITALS — BP 120/64 | HR 54 | Resp 20 | Ht 69.0 in | Wt 189.5 lb

## 2016-07-01 DIAGNOSIS — R0989 Other specified symptoms and signs involving the circulatory and respiratory systems: Secondary | ICD-10-CM | POA: Diagnosis not present

## 2016-07-01 DIAGNOSIS — N183 Chronic kidney disease, stage 3 (moderate): Secondary | ICD-10-CM

## 2016-07-01 DIAGNOSIS — Z95828 Presence of other vascular implants and grafts: Secondary | ICD-10-CM

## 2016-07-01 DIAGNOSIS — I779 Disorder of arteries and arterioles, unspecified: Secondary | ICD-10-CM

## 2016-07-01 DIAGNOSIS — Z87891 Personal history of nicotine dependence: Secondary | ICD-10-CM

## 2016-07-01 DIAGNOSIS — N1832 Chronic kidney disease, stage 3b: Secondary | ICD-10-CM

## 2016-07-01 NOTE — Progress Notes (Signed)
[facility] [place]   CC: Follow up peripheral artery occlusive disease  History of Present Illness Walter [name] is a 83 y.o. male patient of Dr. Kellie Simmering who is s/p left femoral popliteal bypass graft using non reversed greater saphenous vein on 11/27/12 by Dr. Trula Slade.  He is also s/p I&D left leg, general anesthesia, by Dr. Trula Slade on 06/10/13. He returns today for follow up. He no longer has lymphocele problems in his left thigh since the I&D. Pt states he no longer feels the generalized bad feeling.  He had therapy for right rotator cuff issue.  He walked a great deal on his job, has been retired a few years, states walking did not help his circulation since he developed the LE arterial blockages; however, with further investigation, he smoked for 20 years until 1981 then was exposed to second hand smoke in his office chronically until about 2005.  He has been using a treadmill several x/week.  He has intermittent low back pain, made worse by walking, denies claudication sx's with walking, denies radiating pain in his legs with walking.  His back does not hurt when he uses a grocery cart to walk.   eGFR is 40 (stage 3b CKD), serum creatinine is 1.67 on 05-20-16, results from his PCP's office.  He denies any history of stroke or TIA.  Pt Diabetic: No Pt smoker: former smoker, quit in 1981, then exposed to second hand smoke until about 2005 in his office.  Pt meds include: Statin :Yes Betablocker: no ASA: No, pt states he cannot take due to stage 3 CRF Other anticoagulants/antiplatelets: Plavix     Past Medical History:  Diagnosis Date  . Anemia   . Anxiety   . Arthritis    "legs and feet" (06/10/2013)  . Barrett's esophagus    "never dilated" (06/10/2013)  . Cataract   . Chronic kidney disease   . Colon polyps    tubular adenoma-last colon 12/18/2005  . Depression   . Diverticulosis   . GERD (gastroesophageal reflux disease)    takes  omeprazole  . Hematoma    on mid back  . Hiatal hernia   . High cholesterol   . Hypertension   . Hypothyroidism    takes synthroid  . Peripheral vascular disease [facility])     Social History Social History  Substance Use Topics  . Smoking status: Former Smoker    Packs/day: 1.00    Years: 20.00    Types: Cigarettes    Quit date: 06/28/1985  . Smokeless tobacco: Never Used  . Alcohol use No     Comment: 06/10/2013 "drank recreationally when I did drink; stopped ~ 2010"    Family History Family History  Problem Relation Age of Onset  . Heart disease Mother   . Lung cancer Brother        and sister  . Cancer Brother   . Melanoma Father   . Cancer Father   . Alzheimer's disease Sister        x 2  . Heart disease Sister   . Hypertension Sister   . Colon cancer Neg Hx   . Colon polyps Neg Hx   . Rectal cancer Neg Hx   . Pancreatic cancer Neg Hx   . Stomach cancer Neg Hx     Past Surgical History:  Procedure Laterality Date  . CARDIAC CATHETERIZATION    . CATARACT EXTRACTION W/ INTRAOCULAR LENS  IMPLANT, BILATERAL Bilateral   . COLONOSCOPY    .  EYE SURGERY    . FEMORAL-POPLITEAL BYPASS GRAFT Left 11/27/2012   Procedure: BYPASS GRAFT LEFT ABOVE KNEE TO BELOW KNEE POPLITEAL ARTERY USING LEFT NONREVERSED GREATER SAPPHENOUS VEIN;  Surgeon: Serafina Mitchell, MD;  Location: [place] OR;  Service: Vascular;  Laterality: Left;  . I&D EXTREMITY Left 06/10/2013   leg; "from the OR in 11/2012"  . I&D EXTREMITY Left 06/10/2013   Procedure: IRRIGATION AND DEBRIDEMENT OF LEFT UPPER LEG;  Surgeon: Serafina Mitchell, MD;  Location: [place];  Service: Vascular;  Laterality: Left;  . LOWER EXTREMITY ANGIOGRAM N/A 10/01/2012   Procedure: LOWER EXTREMITY ANGIOGRAM;  Surgeon: Jettie Booze, MD;  [facility] C[facility] LAB;  Service: Cardiovascular;  Laterality: N/A;  . POLYPECTOMY     stomach  . PYLOROPLASTY    . TONSILLECTOMY    . TRANSURETHRAL RESECTION OF PROSTATE    . UPPER GASTROINTESTINAL  ENDOSCOPY      Allergies  Allergen Reactions  . Penicillins Other (See Comments)    Boils    Current Outpatient Prescriptions  Medication Sig Dispense Refill  . acetaminophen (TYLENOL) 325 MG tablet Take 650 mg by mouth at bedtime.     Marland Kitchen amLODipine (NORVASC) 5 MG tablet Take 5 mg by mouth at bedtime.     . clopidogrel (PLAVIX) 75 MG tablet Take 1 tablet (75 mg total) by mouth daily. 15 tablet 0  . levothyroxine (SYNTHROID, LEVOTHROID) 125 MCG tablet Take 100 mcg by mouth at bedtime.     . Multiple Vitamins-Iron (CHLORELLA PO) Take 1 tablet by mouth daily as needed (meal supplement).    Marland Kitchen omeprazole (PRILOSEC) 40 MG capsule TAKE 1 CAPSULE BY MOUTH  DAILY 90 capsule 1  . OVER THE COUNTER MEDICATION Place 2 sprays into both nostrils at bedtime. Over the counter nasal spray.    . Probiotic Product (PROBIOTIC PO) Take by mouth daily. Colon support OTC    . rosuvastatin (CRESTOR) 10 MG tablet Take 10 mg by mouth daily.     No current facility-administered medications for this visit.     ROS: See HPI for pertinent positives and negatives.   Physical Examination  Vitals:   07/01/16 1156  BP: 120/64  Pulse: (!) 54  Resp: 20  SpO2: 97%  Weight: 189 lb 8 oz (86 kg)  Height: '5\' 9"'$  (1.753 m)   Body mass index is 27.98 kg/m.  General: A&O x 3, WDWN male. Gait: normal Eyes: no gross abnormalites Pulmonary: Respirations are non labored, good air movement.   Cardiac: regular rythm and rate, no detected murmur. + right carotid bruit.   Radial pulses: are 2+ palpable and =.   VASCULAR EXAM: Extremitieswithout ischemic changes  without Gangrene; without open wounds.     LE Pulses LEFT RIGHT   FEMORAL 2+ palpable 1+ palpable    POPLITEAL not palpable  not palpable   POSTERIOR  TIBIAL not palpable  not palpable    DORSALIS PEDIS  ANTERIOR TIBIAL 1+ palpable  not palpable    Abdomen: soft, NT, no palpable masses. Skin: no rashes, no ulcers. Musculoskeletal: no muscle wasting or atrophy. Neurologic: A&O X 3; Appropriate Affect ; SENSATION: normal; MOTOR FUNCTION: moving all extremities equally. Speech is fluent/normal. CN 2-12 intact.     ASSESSMENT: Walter [name] is a 83 y.o. male who is s/p left femoral popliteal bypass graft using non reversed greater saphenous vein on 11/27/12.  His risk factors for PAD include former smoker until 1981, exposure to chronic second hand smoke in his  office until 2005, and CKD stage 3. Fortunately he does not have DM.  He has no claudication with walking, no signs of ischemia in his lower extremities. He has increased his walking since his last visit.  He has a right carotid bruit, no hx of stroke or TIA. Caroid duplex in 2014 showed <40% bilateral ICA stenosis with significant right ECA stenosis which can account for the right carotid bruit.    DATA  Left LE arterial duplex (06-24-16): Patent left above knee to below knee popliteal bypass graft with no evidence of restenosis. No significant change since the last exam on 12-18-15.    ABI (Date:06-24-16):  R:   ABI: 0.78 (was 0.75 on 12-13-14),   PT: mono  DP: mono   TBI:  0.49 (was 0.75)  L:   ABI: 1.08 (was 0.90),   PT: bi  DP: bi  TBI: 0.87 (was 0.86)   Right ABI remains stable with moderate arterial occlusive disease.  Left has improved from mild disease to normal.   Carotid Duplex in 2014: <40% bilateral ICA stenosis, significant right ECA stenosis.   PLAN: Continue graduated walking program. Based on the patient's vascular studies and examination, pt will return to clinic in 1 year with ABI's, left LE arterial duplex, and bilateral carotid duplex.  I advised pt to notify us if he develops concerns re the  circulation in his feet or legs.   I discussed in depth with the patient the nature of atherosclerosis, and emphasized the importance of maximal medical management including strict control of blood pressure, blood glucose, and lipid levels, obtaining regular exercise, and continued cessation of smoking.  The patient is aware that without maximal medical management the underlying atherosclerotic disease process will progress, limiting the benefit of any interventions.  The patient was given information about PAD including signs, symptoms, treatment, what symptoms should prompt the patient to seek immediate medical care, and risk reduction measures to take.  Clemon Chambers, RN, MSN, FNP-C [facility] Phone: [phone]  Clinic MD: Trula Slade  07/01/16 6:46 PM

## 2016-07-01 NOTE — Patient Instructions (Signed)

## 2016-07-02 NOTE — Addendum Note (Signed)
Addended by: Lianne Cure A on: 07/02/2016 04:30 PM   Modules accepted: Orders

## 2016-07-12 ENCOUNTER — Encounter: Payer: Self-pay | Admitting: Internal Medicine

## 2016-07-12 ENCOUNTER — Ambulatory Visit (AMBULATORY_SURGERY_CENTER): Payer: Medicare Other | Admitting: Internal Medicine

## 2016-07-12 VITALS — BP 124/62 | HR 48 | Temp 96.6°F | Resp 14 | Ht 69.0 in | Wt 189.0 lb

## 2016-07-12 DIAGNOSIS — Z86010 Personal history of colonic polyps: Secondary | ICD-10-CM

## 2016-07-12 DIAGNOSIS — D122 Benign neoplasm of ascending colon: Secondary | ICD-10-CM | POA: Diagnosis not present

## 2016-07-12 DIAGNOSIS — D123 Benign neoplasm of transverse colon: Secondary | ICD-10-CM | POA: Diagnosis not present

## 2016-07-12 MED ORDER — SODIUM CHLORIDE 0.9 % IV SOLN: 500.0000 mL | INTRAVENOUS | Status: DC

## 2016-07-12 NOTE — Progress Notes (Signed)
Adin Hector, CRNA reported pt heart rate  Was in the 40's and 50's during the colonoscopy.  Pt was scheduled for EGD for barrett's esophagus surveillance for 09-20-16 at 2:00 at [facility] and pre-visit for 09-06-16 at 9;00.  No problems noted in the  Recovery room. maw

## 2016-07-12 NOTE — Progress Notes (Signed)
Alert and oriented x3, pleased with MAC, report to RN  

## 2016-07-12 NOTE — Op Note (Signed)
[place] Patient Name: Walter [name] Procedure Date: 07/12/2016 12:36 PM MRN: [id number] Endoscopist: Docia Chuck. Henrene Pastor , MD Age: 83 Referring MD:  Date of Birth: Sep 04, 1933 Gender: Male Account #: [id number] Procedure:                Colonoscopy, with cold snare polypectomy X9 Indications:              High risk colon cancer surveillance: Personal                            history of non-advanced adenoma. Index exam 2007.                            Surveillance Exam 2012(negative) Medicines:                Monitored Anesthesia Care Procedure:                Pre-Anesthesia Assessment:                           - Prior to the procedure, a History and Physical                            was performed, and patient medications and                            allergies were reviewed. The patient's tolerance of                            previous anesthesia was also reviewed. The risks                            and benefits of the procedure and the sedation                            options and risks were discussed with the patient.                            All questions were answered, and informed consent                            was obtained. Prior Anticoagulants: The patient has                            taken Plavix (clopidogrel), last dose was 7 days                            prior to procedure. ASA Grade Assessment: III - A                            patient with severe systemic disease. After                            reviewing the risks and benefits, the patient was  deemed in satisfactory condition to undergo the                            procedure.                           After obtaining informed consent, the colonoscope                            was passed under direct vision. Throughout the                            procedure, the patient's blood pressure, pulse, and                            oxygen saturations were monitored  continuously. The                            Colonoscope was introduced through the anus and                            advanced to the the cecum, identified by                            appendiceal orifice and ileocecal valve. The                            ileocecal valve, appendiceal orifice, and rectum                            were photographed. The quality of the bowel                            preparation was good. The colonoscopy was performed                            without difficulty. The patient tolerated the                            procedure well. The bowel preparation used was                            SUPREP. Scope In: 12:42:40 PM Scope Out: 1:04:07 PM Scope Withdrawal Time: 0 hours 16 minutes 48 seconds  Total Procedure Duration: 0 hours 21 minutes 27 seconds  Findings:                 Nine polyps were found in the transverse colon and                            ascending colon. The polyps were 2 to 7 mm in size.                            These polyps were removed with a cold snare.  Resection and retrieval were complete.                           Multiple small and large-mouthed diverticula were                            found in the entire colon. The changes were most                            prominent in the sigmoid region with an element of                            stenosis of rectal discomfort he.                           Internal hemorrhoids were found during retroflexion.                           The exam was otherwise without abnormality on                            direct and retroflexion views. Complications:            No immediate complications. Estimated blood loss:                            None. Estimated Blood Loss:     Estimated blood loss: none. Impression:               - Nine 2 to 7 mm polyps in the transverse colon and                            in the ascending colon, removed with a cold snare.                             Resected and retrieved.                           - Diverticulosis in the entire examined colon.                           - Internal hemorrhoids.                           - The examination was otherwise normal on direct                            and retroflexion views. Recommendation:           - Repeat colonoscopy in 3 years for surveillance.                           - Resume Plavix (clopidogrel) today at prior dose.                           - Patient has a contact number available  for                            emergencies. The signs and symptoms of potential                            delayed complications were discussed with the                            patient. Return to normal activities tomorrow.                            Written discharge instructions were provided to the                            patient.                           - Resume previous diet.                           - Continue present medications.                           - Await pathology results.                           **PLEASE SCHEDULE PATIENT FOR EGD IN [facility] "Barrett's                            esophagus, surveillance". HOLD PLAVIX 1 WEEK PRIOR. Docia Chuck. Henrene Pastor, MD 07/12/2016 1:12:57 PM This report has been signed electronically.

## 2016-07-12 NOTE — Patient Instructions (Signed)
YOU HAD AN ENDOSCOPIC PROCEDURE TODAY AT [facility]:   Refer to the procedure report that was given to you for any specific questions about what was found during the examination.  If the procedure report does not answer your questions, please call your gastroenterologist to clarify.  If you requested that your care partner not be given the details of your procedure findings, then the procedure report has been included in a sealed envelope for you to review at your convenience later.  YOU SHOULD EXPECT: Some feelings of bloating in the abdomen. Passage of more gas than usual.  Walking can help get rid of the air that was put into your GI tract during the procedure and reduce the bloating. If you had a lower endoscopy (such as a colonoscopy or flexible sigmoidoscopy) you may notice spotting of blood in your stool or on the toilet paper. If you underwent a bowel prep for your procedure, you may not have a normal bowel movement for a few days.  Please Note:  You might notice some irritation and congestion in your nose or some drainage.  This is from the oxygen used during your procedure.  There is no need for concern and it should clear up in a day or so.  SYMPTOMS TO REPORT IMMEDIATELY:   Following lower endoscopy (colonoscopy or flexible sigmoidoscopy):  Excessive amounts of blood in the stool  Significant tenderness or worsening of abdominal pains  Swelling of the abdomen that is new, acute  Fever of 100F or higher   For urgent or emergent issues, a gastroenterologist can be reached at any hour by calling [phone].   DIET:  We do recommend a small meal at first, but then you may proceed to your regular diet.  Drink plenty of fluids but you should avoid alcoholic beverages for 24 hours.  ACTIVITY:  You should plan to take it easy for the rest of today and you should NOT DRIVE or use heavy machinery until tomorrow (because of the sedation medicines used during the test).     FOLLOW UP: Our staff will call the number listed on your records the next business day following your procedure to check on you and address any questions or concerns that you may have regarding the information given to you following your procedure. If we do not reach you, we will leave a message.  However, if you are feeling well and you are not experiencing any problems, there is no need to return our call.  We will assume that you have returned to your regular daily activities without incident.  If any biopsies were taken you will be contacted by phone or by letter within the next 1-3 weeks.  Please call us at [phone] if you have not heard about the biopsies in 3 weeks.    SIGNATURES/CONFIDENTIALITY: You and/or your care partner have signed paperwork which will be entered into your electronic medical record.  These signatures attest to the fact that that the information above on your After Visit Summary has been reviewed and is understood.  Full responsibility of the confidentiality of this discharge information lies with you and/or your care-partner.    Handouts were given to your care partner on polyps, diverticulosis, hemorrhoids and a high fiber diet with liberal fluid intake. Per Dr. Henrene Pastor you may resume your PLAVIX today at prior dose. You may resume your other current medications today. Await biopsy results. You need to have your upper endoscopy scheduled for Barrett's  esophagus surveillance and will need to hold PLAVIX 1 week prior. Please call if any questions or concerns.

## 2016-07-12 NOTE — Progress Notes (Signed)
Pt's states no medical or surgical changes since previsit or office visit. 

## 2016-07-12 NOTE — Progress Notes (Signed)
Called to room to assist during endoscopic procedure.  Patient ID and intended procedure confirmed with present staff. Received instructions for my participation in the procedure from the performing physician.  

## 2016-07-15 ENCOUNTER — Telehealth: Payer: Self-pay | Admitting: *Deleted

## 2016-07-15 ENCOUNTER — Telehealth: Payer: Self-pay

## 2016-07-15 NOTE — Telephone Encounter (Signed)
  Follow up Call-  Call back number 07/12/2016  Post procedure Call Back phone  # [phone]  Permission to leave phone message Yes  Some recent data might be hidden     Patient questions:  Do you have a fever, pain , or abdominal swelling? No. Pain Score  0 *  Have you tolerated food without any problems? Yes.    Have you been able to return to your normal activities? Yes.    Do you have any questions about your discharge instructions: Diet   No. Medications  No. Follow up visit  No.  Do you have questions or concerns about your Care? No.  Actions: * If pain score is 4 or above: No action needed, pain <4.

## 2016-07-15 NOTE — Telephone Encounter (Signed)
Called [phone] and left a messaged we tried to reach pt for a follow up call. maw

## 2016-07-16 ENCOUNTER — Encounter: Payer: Self-pay | Admitting: Internal Medicine

## 2016-08-16 ENCOUNTER — Telehealth: Payer: Self-pay | Admitting: Internal Medicine

## 2016-08-19 MED ORDER — LINACLOTIDE 290 MCG PO CAPS: 290.0000 ug | ORAL_CAPSULE | Freq: Every day | ORAL | 6 refills | Status: DC

## 2016-08-19 NOTE — Telephone Encounter (Signed)
Calling back about his Linzess

## 2016-08-19 NOTE — Telephone Encounter (Signed)
Linzess 290 mcg sent to [facility]

## 2016-09-06 ENCOUNTER — Ambulatory Visit (AMBULATORY_SURGERY_CENTER): Payer: Self-pay | Admitting: *Deleted

## 2016-09-06 ENCOUNTER — Encounter: Payer: Self-pay | Admitting: Internal Medicine

## 2016-09-06 VITALS — Ht 69.0 in | Wt 188.8 lb

## 2016-09-06 DIAGNOSIS — K2270 Barrett's esophagus without dysplasia: Secondary | ICD-10-CM

## 2016-09-06 NOTE — Progress Notes (Signed)
No egg or soy allergy  No hx of anesthesia or intubation problem  No hx of sleep apnea or home oxygen used  No diet medications taken

## 2016-09-09 ENCOUNTER — Other Ambulatory Visit: Payer: Self-pay | Admitting: Internal Medicine

## 2016-09-20 ENCOUNTER — Encounter: Payer: Self-pay | Admitting: Internal Medicine

## 2016-09-20 ENCOUNTER — Ambulatory Visit (AMBULATORY_SURGERY_CENTER): Payer: Medicare Other | Admitting: Internal Medicine

## 2016-09-20 ENCOUNTER — Encounter: Payer: Medicare Other | Admitting: Internal Medicine

## 2016-09-20 VITALS — BP 160/69 | HR 57 | Temp 96.2°F | Resp 15 | Ht 69.0 in | Wt 188.0 lb

## 2016-09-20 DIAGNOSIS — K2270 Barrett's esophagus without dysplasia: Secondary | ICD-10-CM

## 2016-09-20 MED ORDER — SODIUM CHLORIDE 0.9 % IV SOLN: 500.0000 mL | INTRAVENOUS | Status: DC

## 2016-09-20 NOTE — Progress Notes (Signed)
Pt's states no medical or surgical changes since previsit or office visit. 

## 2016-09-20 NOTE — Op Note (Signed)
[place] Patient Name: Walter [name] Procedure Date: 09/20/2016 10:39 AM MRN: [id number] Endoscopist: Docia Chuck. Henrene Pastor , MD Age: 83 Referring MD:  Date of Birth: November 26, 1933 Gender: Male Account #: [id number] Procedure:                Upper GI endoscopy, with biopsies Indications:              Surveillance for malignancy due to personal history                            of Barrett's esophagus. Remote history of low-grade                            dysplasia and atypia. Last examinations in 2013 and                            2015 with nondysplastic Barrett's Medicines:                Monitored Anesthesia Care Procedure:                Pre-Anesthesia Assessment:                           - Prior to the procedure, a History and Physical                            was performed, and patient medications and                            allergies were reviewed. The patient's tolerance of                            previous anesthesia was also reviewed. The risks                            and benefits of the procedure and the sedation                            options and risks were discussed with the patient.                            All questions were answered, and informed consent                            was obtained. Prior Anticoagulants: The patient has                            taken Plavix (clopidogrel), last dose was 7 days                            prior to procedure. ASA Grade Assessment: III - A                            patient with severe systemic disease. After  reviewing the risks and benefits, the patient was                            deemed in satisfactory condition to undergo the                            procedure.                           After obtaining informed consent, the endoscope was                            passed under direct vision. Throughout the                            procedure, the patient's blood pressure,  pulse, and                            oxygen saturations were monitored continuously. The                            Endoscope was introduced through the mouth, and                            advanced to the second part of duodenum. The upper                            GI endoscopy was accomplished without difficulty.                            The patient tolerated the procedure well. Scope In: Scope Out: Findings:                 There were esophageal mucosal changes secondary to                            established long-segment Barrett's disease present                            in the lower third of the esophagus. This was                            examined under white light and narrow band imaging.                            Standard and magnified views. No obvious                            inflammation, nodules, or mucosal irregularity.                            Mucosa was biopsied with a cold forceps for                            histology in  4 quadrants at intervals of 1.5 cm in                            the lower third of the esophagus. A total of 4                            specimen bottles were sent to pathology with total                            of 24 biopsies obtained.                           The stomach was normal, except for diminutive                            isolated benign-appearing antral polyp.                           The examined duodenum was normal.                           The cardia and gastric fundus were normal on                            retroflexion. Complications:            No immediate complications. Estimated Blood Loss:     Estimated blood loss: none. Impression:               - Esophageal mucosal changes secondary to                            established long-segment Barrett's disease.                            Biopsied.                           - Normal stomach.                           - Normal examined  duodenum. Recommendation:           - Patient has a contact number available for                            emergencies. The signs and symptoms of potential                            delayed complications were discussed with the                            patient. Return to normal activities tomorrow.                            Written discharge instructions were provided to the  patient.                           - Resume previous diet.                           - Continue present medications.                           - Await pathology results. If no dysplasia on                            biopsies, then no further surveillance planned.                           - Resume Plavix (clopidogrel) at prior dose today. Docia Chuck. Henrene Pastor, MD 09/20/2016 11:05:17 AM This report has been signed electronically.

## 2016-09-20 NOTE — Progress Notes (Signed)
Called to room to assist during endoscopic procedure.  Patient ID and intended procedure confirmed with present staff. Received instructions for my participation in the procedure from the performing physician.  

## 2016-09-20 NOTE — Patient Instructions (Signed)
YOU HAD AN ENDOSCOPIC PROCEDURE TODAY AT [facility]:   Refer to the procedure report that was given to you for any specific questions about what was found during the examination.  If the procedure report does not answer your questions, please call your gastroenterologist to clarify.  If you requested that your care partner not be given the details of your procedure findings, then the procedure report has been included in a sealed envelope for you to review at your convenience later.  YOU SHOULD EXPECT: Some feelings of bloating in the abdomen. Passage of more gas than usual.  Walking can help get rid of the air that was put into your GI tract during the procedure and reduce the bloating.   Please Note:  You might notice some irritation and congestion in your nose or some drainage.  This is from the oxygen used during your procedure.  There is no need for concern and it should clear up in a day or so.  SYMPTOMS TO REPORT IMMEDIATELY:    Following upper endoscopy (EGD)  Vomiting of blood or coffee ground material  New chest pain or pain under the shoulder blades  Painful or persistently difficult swallowing  New shortness of breath  Fever of 100F or higher  Black, tarry-looking stools  For urgent or emergent issues, a gastroenterologist can be reached at any hour by calling [phone].   DIET:  We do recommend a small meal at first, but then you may proceed to your regular diet.  Drink plenty of fluids but you should avoid alcoholic beverages for 24 hours. Start with liquids, then soft diet, and work your way up to a regular diet as tolerated.  ACTIVITY:  You should plan to take it easy for the rest of today and you should NOT DRIVE or use heavy machinery until tomorrow (because of the sedation medicines used during the test).    FOLLOW UP: Our staff will call the number listed on your records the next business day following your procedure to check on you and address  any questions or concerns that you may have regarding the information given to you following your procedure. If we do not reach you, we will leave a message.  However, if you are feeling well and you are not experiencing any problems, there is no need to return our call.  We will assume that you have returned to your regular daily activities without incident.  If any biopsies were taken you will be contacted by phone or by letter within the next 1-3 weeks.  Please call us at [phone] if you have not heard about the biopsies in 3 weeks. We took several biopsies out of your esophagus.  You may have some soreness in that area.  Resume your plavix today per Dr. Henrene Pastor.   SIGNATURES/CONFIDENTIALITY: You and/or your care partner have signed paperwork which will be entered into your electronic medical record.  These signatures attest to the fact that that the information above on your After Visit Summary has been reviewed and is understood.  Full responsibility of the confidentiality of this discharge information lies with you and/or your care-partner.  Read all of the handouts given to you by your recovery room nurse.

## 2016-09-20 NOTE — Progress Notes (Signed)
To recovery, report bto RN, VSS

## 2016-09-23 ENCOUNTER — Telehealth: Payer: Self-pay

## 2016-09-23 ENCOUNTER — Telehealth: Payer: Self-pay | Admitting: Nurse Practitioner

## 2016-09-23 NOTE — Telephone Encounter (Signed)
  Follow up Call-  Call Vidit Boissonneault number 09/20/2016 07/12/2016  Post procedure Call Evens Meno phone  # [phone]  Permission to leave phone message Yes Yes  Some recent data might be hidden     Patient questions:  Do you have a fever, pain , or abdominal swelling? No. Pain Score  0 *  Have you tolerated food without any problems? Yes.    Have you been able to return to your normal activities? Yes.    Do you have any questions about your discharge instructions: Diet   No. Medications  No. Follow up visit  No.  Do you have questions or concerns about your Care? No.  Actions: * If pain score is 4 or above: No action needed, pain <4.

## 2016-09-23 NOTE — Telephone Encounter (Signed)
Walter [name], please follow up with patient regarding chest pain. He call Friday after EGD wit complaints of chest pain that was improving after OTC antiacid. He had no SOB. No nausea, diaphoresis. He had not taken his PPI. I advised him to take the PPI and call back if pain persisted. He felt like the pain was reflux related. I didn't hear back from him but could you just make sure he is doing okay?  Thanks

## 2016-09-25 ENCOUNTER — Encounter: Payer: Self-pay | Admitting: Internal Medicine

## 2016-12-09 ENCOUNTER — Other Ambulatory Visit: Payer: Self-pay | Admitting: Internal Medicine

## 2017-01-16 ENCOUNTER — Other Ambulatory Visit: Payer: Self-pay

## 2017-01-16 MED ORDER — OMEPRAZOLE 40 MG PO CPDR: 40.0000 mg | DELAYED_RELEASE_CAPSULE | Freq: Every day | ORAL | 0 refills | Status: DC

## 2017-02-03 ENCOUNTER — Other Ambulatory Visit: Payer: Self-pay

## 2017-02-03 MED ORDER — OMEPRAZOLE 40 MG PO CPDR: 40.0000 mg | DELAYED_RELEASE_CAPSULE | Freq: Every day | ORAL | 2 refills | Status: DC

## 2017-07-02 ENCOUNTER — Encounter (HOSPITAL_COMMUNITY): Payer: Medicare Other

## 2017-07-02 ENCOUNTER — Other Ambulatory Visit (HOSPITAL_COMMUNITY): Payer: Medicare Other

## 2017-07-02 ENCOUNTER — Ambulatory Visit: Payer: Medicare Other | Admitting: Family

## 2017-07-03 ENCOUNTER — Ambulatory Visit: Payer: Medicare Other | Admitting: Family

## 2017-07-03 ENCOUNTER — Encounter: Payer: Self-pay | Admitting: Family

## 2017-07-03 ENCOUNTER — Ambulatory Visit (INDEPENDENT_AMBULATORY_CARE_PROVIDER_SITE_OTHER)
Admission: RE | Admit: 2017-07-03 | Discharge: 2017-07-03 | Disposition: A | Discharge status: Home / Self Care | Payer: Medicare Other | Source: Ambulatory Visit | Attending: Family | Admitting: Family

## 2017-07-03 ENCOUNTER — Ambulatory Visit (HOSPITAL_COMMUNITY)
Admission: RE | Admit: 2017-07-03 | Discharge: 2017-07-03 | Disposition: A | Discharge status: Home / Self Care | Payer: Medicare Other | Source: Ambulatory Visit | Attending: Family | Admitting: Family

## 2017-07-03 VITALS — BP 139/69 | HR 49 | Temp 97.1°F | Ht 68.0 in | Wt 198.0 lb

## 2017-07-03 DIAGNOSIS — Z95828 Presence of other vascular implants and grafts: Secondary | ICD-10-CM

## 2017-07-03 DIAGNOSIS — Z87891 Personal history of nicotine dependence: Secondary | ICD-10-CM

## 2017-07-03 DIAGNOSIS — I779 Disorder of arteries and arterioles, unspecified: Secondary | ICD-10-CM

## 2017-07-03 DIAGNOSIS — I10 Essential (primary) hypertension: Secondary | ICD-10-CM | POA: Diagnosis not present

## 2017-07-03 DIAGNOSIS — R0989 Other specified symptoms and signs involving the circulatory and respiratory systems: Secondary | ICD-10-CM

## 2017-07-03 NOTE — Progress Notes (Signed)
[facility] [place]   CC: Follow up peripheral artery occlusive disease  History of Present Illness Walter [name] is a 84 y.o. male who is s/p left femoral popliteal bypass graft using non reversed greater saphenous vein on 11/27/12 by Dr. Trula Slade.  He is also s/p I&D left leg, general anesthesia, by Dr. Trula Slade on 06/10/13. He returns today for follow up. He no longer has lymphocele problems in his left thigh since the I&D. Pt states he no longer feels the generalized bad feeling.  He had therapy for right rotator cuff issue.  He walked a great deal on his job, has been retired a few years, states walking did not help his circulation since he developed the LE arterial blockages; however, with further investigation, he smoked for 20 years until 1981 then was exposed to second hand smoke in his office chronically until about 2005.  He has been usinga treadmill several x/week.  He has intermittent low back pain, made worse by walking, denies claudication sx's with walking, denies radiating pain in his legs with walking.  His back does not hurt when he uses a grocery cart to walk.   Swelling in his left foot and calf started a few weeks ago, not improved by elevation, he denies pain in his left calf.  The swelling started when he used a vibration machine on his legs.  He is taking amlodipine which commonly causes non pitting edema in the ankles.   He occasionally has pain at the plantar surface, base of his metatarsals.  He denies dyspnea.    eGFR is 40 (stage 3b CKD), serum creatinine is 1.67 on 05-20-16, results from his PCP's office.  He denies any history of stroke or TIA.  Pt Diabetic: No Pt smoker: former smoker, quit in 1981, then exposed to second hand smoke until about 2005 in his office.  Pt meds include: Statin :Yes Betablocker: no ASA: No, pt states he cannot take due to stage 3 CRF Other anticoagulants/antiplatelets: Plavix    Past Medical  History:  Diagnosis Date  . Anemia    pt denies this  . Anxiety   . Arthritis    "legs and feet" (06/10/2013)  . Barrett's esophagus    "never dilated" (06/10/2013)  . Cataract   . Chronic kidney disease    stage 3  . Colon polyps    tubular adenoma-last colon 12/18/2005  . Depression   . Diverticulosis   . GERD (gastroesophageal reflux disease)    takes omeprazole  . Hematoma    on mid back  . Hiatal hernia   . High cholesterol   . Hypertension   . Hypothyroidism    takes synthroid  . Peripheral vascular disease [facility])     Social History Social History   Tobacco Use  . Smoking status: Former Smoker    Packs/day: 1.00    Years: 20.00    Pack years: 20.00    Types: Cigarettes    Last attempt to quit: 06/28/1985    Years since quitting: 32.0  . Smokeless tobacco: Never Used  Substance Use Topics  . Alcohol use: No    Comment: 06/10/2013 "drank recreationally when I did drink; stopped ~ 2010"  . Drug use: No    Family History Family History  Problem Relation Age of Onset  . Heart disease Mother   . Lung cancer Brother        and sister  . Cancer Brother   . Melanoma Father   .  Cancer Father   . Alzheimer's disease Sister        x 2  . Heart disease Sister   . Hypertension Sister   . Colon cancer Neg Hx   . Colon polyps Neg Hx   . Rectal cancer Neg Hx   . Pancreatic cancer Neg Hx   . Stomach cancer Neg Hx   . Esophageal cancer Neg Hx     Past Surgical History:  Procedure Laterality Date  . CARDIAC CATHETERIZATION    . CATARACT EXTRACTION W/ INTRAOCULAR LENS  IMPLANT, BILATERAL Bilateral   . COLONOSCOPY    . EYE SURGERY    . FEMORAL-POPLITEAL BYPASS GRAFT Left 11/27/2012   Procedure: BYPASS GRAFT LEFT ABOVE KNEE TO BELOW KNEE POPLITEAL ARTERY USING LEFT NONREVERSED GREATER SAPPHENOUS VEIN;  Surgeon: Serafina Mitchell, MD;  Location: [place] OR;  Service: Vascular;  Laterality: Left;  . I&D EXTREMITY Left 06/10/2013   leg; "from the OR in 11/2012"  . I&D  EXTREMITY Left 06/10/2013   Procedure: IRRIGATION AND DEBRIDEMENT OF LEFT UPPER LEG;  Surgeon: Serafina Mitchell, MD;  Location: [place];  Service: Vascular;  Laterality: Left;  . LOWER EXTREMITY ANGIOGRAM N/A 10/01/2012   Procedure: LOWER EXTREMITY ANGIOGRAM;  Surgeon: Jettie Booze, MD;  Location: [facility] CATH LAB;  Service: Cardiovascular;  Laterality: N/A;  . POLYPECTOMY     stomach  . PYLOROPLASTY    . TONSILLECTOMY    . TRANSURETHRAL RESECTION OF PROSTATE    . UPPER GASTROINTESTINAL ENDOSCOPY      Allergies  Allergen Reactions  . Penicillins Other (See Comments)    Boils    Current Outpatient Medications  Medication Sig Dispense Refill  . acetaminophen (TYLENOL) 325 MG tablet Take 650 mg by mouth at bedtime.     Marland Kitchen amLODipine (NORVASC) 5 MG tablet Take 5 mg by mouth at bedtime.     . clopidogrel (PLAVIX) 75 MG tablet Take 1 tablet (75 mg total) by mouth daily. 15 tablet 0  . levothyroxine (SYNTHROID, LEVOTHROID) 125 MCG tablet Take 100 mcg by mouth at bedtime.     . Multiple Vitamins-Iron (CHLORELLA PO) Take 1 tablet by mouth daily as needed (meal supplement).    Marland Kitchen omeprazole (PRILOSEC) 40 MG capsule Take 1 capsule (40 mg total) by mouth daily. 90 capsule 2  . OVER THE COUNTER MEDICATION Place 2 sprays into both nostrils at bedtime. Over the counter nasal spray.    . Probiotic Product (PROBIOTIC PO) Take by mouth daily. Colon support OTC    . rosuvastatin (CRESTOR) 10 MG tablet Take 10 mg by mouth daily.    Marland Kitchen linaclotide (LINZESS) 290 MCG CAPS capsule Take 1 capsule (290 mcg total) by mouth daily before breakfast. (Patient not taking: Reported on 07/03/2017) 30 capsule 6   Current Facility-Administered Medications  Medication Dose Route Frequency Provider Last Rate Last Dose  . 0.9 %  sodium chloride infusion  500 mL Intravenous Continuous Irene Shipper, MD        ROS: See HPI for pertinent positives and negatives.   Physical Examination  Vitals:   07/03/17 1255 07/03/17 1257   BP: 129/64 139/69  Pulse: (!) 49 (!) 49  Temp: (!) 97.1 F (36.2 C)   SpO2: 97% 97%  Weight: 198 lb (89.8 kg)   Height: _0  (1.727 m)    Body mass index is 30.11 kg/m.  General: A&O x 3, WDWN male. Gait: normal Eyes: no gross abnormalites Pulmonary: Respirations are non labored, good air  movement.   Cardiac: regular rythm and rate, no detected murmur. + right carotid bruit.    VASCULAR EXAM: Extremitieswithout ischemic changes, without Gangrene; without open wounds. Bilateral pretibial edema: trace right, 1+ left.   Radial pulses: are 2+ palpable and =.  Abdominal aortic pulse is not palpable.      LE Pulses LEFT RIGHT   FEMORAL 2+ palpable 1+ palpable    POPLITEAL not palpable  not palpable   POSTERIOR TIBIAL not palpable  not palpable    DORSALIS PEDIS  ANTERIOR TIBIAL 1+ palpable  notpalpable    Abdomen: soft, NT, no palpable masses. Skin: no rashes, no cellulitis, no ulcers noted. Musculoskeletal: no muscle wasting or atrophy.  Neurologic: A&O X 3; appropriate affect, Sensation is normal; MOTOR FUNCTION:  moving all extremities equally, motor strength 5/5 throughout. Speech is fluent/normal. CN 2-12 intact. Psychiatric: Thought content is normal, mood appropriate for clinical situation.    ASSESSMENT: Walter [name] is a 84 y.o. male who is s/p left femoral popliteal bypass graft using non reversed greater saphenous vein on 11/27/12.  His risk factors for PAD include former smoker until 1981, exposure to chronic second hand smoke in his office until 2005, and CKD stage 3. Fortunately he does not have DM.  He has no claudication with walking, no signs of ischemia in his lower extremities.  He has a right carotid bruit, no hx of stroke or  TIA. Caroid duplex in 2014 showed <40% bilateral ICA stenosis with significant right ECA stenosis which can account for the right carotid bruit, no change in carotid stenosis, duplex on 07-03-17.  He states that he wears his knee high compression hose daily, has worn since November 2014.   I advised pt to discuss with his PCP whether he would benefit from a spine evaluation.    DATA  Carotid Duplex (07-03-17): 1-39% bilateral ICA stenosis Right ECA: >50% stenosis Bilateral vertebral artery flow is antegrade.  Bilateral subclavian artery waveforms are normal.  No change compared to the exam in 2014.   Left LE Arterial duplex (07-03-17):  No significant stenosis in the bypass graft. All biphasic waveforms.  A cursory look was given to the left venous system without evidence of DVT.  No significant change compared the exams on 12-18-15 and 06-24-16.   ABI (Date: 07/03/2017):  R:   ABI: 0.90 (was 0.78 on 06-24-16),   PT: bi  DP: mono  TBI:  0.70 (was 0.49)  L:   ABI: 0.95 (was 1.08),   PT: bi  DP: bi  TBI: 0.67 (was 0.87)  Improved ABI on the right with mild disease, bi and monophasic waveforms. Slight decline in ABI in the left with no disease, biphasic waveforms. TBI improved on the right, declined in the left.    PLAN: Continue graduated walking program, walk at least 30 minutes daily. Based on the patient's vascular studies and examination, pt will return to clinic in 1 yearwith ABI's, left LE arterial duplex,  Carotid duplex in 2-3 years. I advised pt to notify us if he develops concerns re the circulation in his feet or legs.    I discussed in depth with the patient the nature of atherosclerosis, and emphasized the importance of maximal medical management including strict control of blood pressure, blood glucose, and lipid levels, obtaining regular exercise, and continued cessation of smoking.  The patient is aware that without maximal medical management the  underlying atherosclerotic disease process will progress, limiting the benefit of any interventions.  The patient was given information about PAD including signs, symptoms, treatment, what symptoms should prompt the patient to seek immediate medical care, and risk reduction measures to take.  Clemon Chambers, RN, MSN, FNP-C [facility] Phone: [phone]  Clinic MD: Oneida Alar  07/03/17 1:30 PM

## 2017-07-03 NOTE — Patient Instructions (Signed)

## 2017-12-16 ENCOUNTER — Other Ambulatory Visit: Payer: Self-pay | Admitting: Internal Medicine

## 2018-07-03 ENCOUNTER — Other Ambulatory Visit: Payer: Self-pay

## 2018-07-03 DIAGNOSIS — I779 Disorder of arteries and arterioles, unspecified: Secondary | ICD-10-CM

## 2018-07-06 ENCOUNTER — Ambulatory Visit (HOSPITAL_COMMUNITY)
Admission: RE | Admit: 2018-07-06 | Discharge: 2018-07-06 | Disposition: A | Discharge status: Home / Self Care | Payer: Medicare Other | Source: Ambulatory Visit | Attending: Family | Admitting: Family

## 2018-07-06 ENCOUNTER — Other Ambulatory Visit: Payer: Self-pay

## 2018-07-06 ENCOUNTER — Ambulatory Visit (INDEPENDENT_AMBULATORY_CARE_PROVIDER_SITE_OTHER)
Admission: RE | Admit: 2018-07-06 | Discharge: 2018-07-06 | Disposition: A | Discharge status: Home / Self Care | Payer: Medicare Other | Source: Ambulatory Visit | Attending: Family | Admitting: Family

## 2018-07-06 ENCOUNTER — Ambulatory Visit: Payer: Medicare Other | Admitting: Family

## 2018-07-06 ENCOUNTER — Encounter: Payer: Self-pay | Admitting: Family

## 2018-07-06 VITALS — BP 143/66 | Temp 97.6°F | Resp 14 | Ht 69.0 in | Wt 199.3 lb

## 2018-07-06 DIAGNOSIS — I779 Disorder of arteries and arterioles, unspecified: Secondary | ICD-10-CM | POA: Insufficient documentation

## 2018-07-06 DIAGNOSIS — Z95828 Presence of other vascular implants and grafts: Secondary | ICD-10-CM | POA: Diagnosis not present

## 2018-07-06 DIAGNOSIS — Z87891 Personal history of nicotine dependence: Secondary | ICD-10-CM

## 2018-07-06 NOTE — Patient Instructions (Signed)
Peripheral Vascular Disease  Peripheral vascular disease (PVD) is a disease of the blood vessels that are not part of your heart and brain. A simple term for PVD is poor circulation. In most cases, PVD narrows the blood vessels that carry blood from your heart to the rest of your body. This can reduce the supply of blood to your arms, legs, and internal organs, like your stomach or kidneys. However, PVD most often affects a person's lower legs and feet. Without treatment, PVD tends to get worse. PVD can also lead to acute ischemic limb. This is when an arm or leg suddenly cannot get enough blood. This is a medical emergency. Follow these instructions at home: Lifestyle  Do not use any products that contain nicotine or tobacco, such as cigarettes and e-cigarettes. If you need help quitting, ask your doctor.  Lose weight if you are overweight. Or, stay at a healthy weight as told by your doctor.  Eat a diet that is low in fat and cholesterol. If you need help, ask your doctor.  Exercise regularly. Ask your doctor for activities that are right for you. General instructions  Take over-the-counter and prescription medicines only as told by your doctor.  Take good care of your feet: ? Wear comfortable shoes that fit well. ? Check your feet often for any cuts or sores.  Keep all follow-up visits as told by your doctor This is important. Contact a doctor if:  You have cramps in your legs when you walk.  You have leg pain when you are at rest.  You have coldness in a leg or foot.  Your skin changes.  You are unable to get or have an erection (erectile dysfunction).  You have cuts or sores on your feet that do not heal. Get help right away if:  Your arm or leg turns cold, numb, and blue.  Your arms or legs become red, warm, swollen, painful, or numb.  You have chest pain.  You have trouble breathing.  You suddenly have weakness in your face, arm, or leg.  You become very  confused or you cannot speak.  You suddenly have a very bad headache.  You suddenly cannot see. Summary  Peripheral vascular disease (PVD) is a disease of the blood vessels.  A simple term for PVD is poor circulation. Without treatment, PVD tends to get worse.  Treatment may include exercise, low fat and low cholesterol diet, and quitting smoking. This information is not intended to replace advice given to you by your health care provider. Make sure you discuss any questions you have with your health care provider. Document Released: 03/20/2009 Document Revised: 12/06/2016 Document Reviewed: 02/01/2016 [employer]vier Patient Education  2020 [employer]  

## 2018-07-06 NOTE — Progress Notes (Signed)
[facility] [place]   CC: Follow up peripheral artery occlusive disease  History of Present Illness Walter [name] is a 85 y.o. male who is s/p left femoral popliteal bypass graft using non reversed greater saphenous vein on 11/27/12 by Dr. Trula Slade.  He is also s/p I&D left leg, general anesthesia, by Dr. Trula Slade on 06/10/13. He returns today for follow up. He no longer has lymphocele problems in his left thigh since the I&D.  He had therapy for right rotator cuff issue.  He walked a great deal on his job, has been retired a few years, states walking did not help his circulation since he developed the LE arterial blockages; however, with further investigation, he smoked for 20 years until 1981 then was exposed to second hand smoke in his office chronically until about 2005.  He had been usinga treadmill several x/week at his gym until January 2020. Since then is performing daily seated leg exercises.   Hehas intermittent low back pain, made worse by walking, denies claudication sx's with walking, denies radiating pain in his legs with walking.  He does report numbness in his feet for a long time.  He has been having bilateral hip pain with walking in the last several months, relieved by sitting.   His back does not hurt when he uses a grocery cart to walk.   Swelling in his left foot and calf continues, significantly improved by the use of knee high compression hose, not improved by elevation, he denies pain in his left calf.  The swelling started when he used a vibration machine on his legs.  He is taking amlodipine which commonly causes non pitting edema in the ankles.   He occasionally has pain at the plantar surface, base of his metatarsals. He has numbness in his feet which he states is neuropathy.  He denies dyspnea.    eGFR is 40(stage 3b CKD), serum creatinine is 1.67 on 05-20-16, results from his PCP's office. He denies any history of stroke or  TIA.  Diabetic: No Tobacco use:  former smoker, quit in 1981, then exposed to second hand smoke until about 2005 in his office  Pt meds include: Statin :Yes Betablocker: No ASA: No, pt states he cannot take due to stage 3 CRF Other anticoagulants/antiplatelets: Plavix  Past Medical History:  Diagnosis Date  . Anemia    pt denies this  . Anxiety   . Arthritis    "legs and feet" (06/10/2013)  . Barrett's esophagus    "never dilated" (06/10/2013)  . Cataract   . Chronic kidney disease    stage 3  . Colon polyps    tubular adenoma-last colon 12/18/2005  . Depression   . Diverticulosis   . GERD (gastroesophageal reflux disease)    takes omeprazole  . Hematoma    on mid back  . Hiatal hernia   . High cholesterol   . Hypertension   . Hypothyroidism    takes synthroid  . Peripheral vascular disease [facility])     Social History Social History   Tobacco Use  . Smoking status: Former Smoker    Packs/day: 1.00    Years: 20.00    Pack years: 20.00    Types: Cigarettes    Quit date: 06/28/1985    Years since quitting: 33.0  . Smokeless tobacco: Never Used  Substance Use Topics  . Alcohol use: No    Comment: 06/10/2013 "drank recreationally when I did drink; stopped ~ 2010"  .  Drug use: No    Family History Family History  Problem Relation Age of Onset  . Heart disease Mother   . Lung cancer Brother        and sister  . Cancer Brother   . Melanoma Father   . Cancer Father   . Alzheimer's disease Sister        x 2  . Heart disease Sister   . Hypertension Sister   . Colon cancer Neg Hx   . Colon polyps Neg Hx   . Rectal cancer Neg Hx   . Pancreatic cancer Neg Hx   . Stomach cancer Neg Hx   . Esophageal cancer Neg Hx     Past Surgical History:  Procedure Laterality Date  . CARDIAC CATHETERIZATION    . CATARACT EXTRACTION W/ INTRAOCULAR LENS  IMPLANT, BILATERAL Bilateral   . COLONOSCOPY    . EYE SURGERY    . FEMORAL-POPLITEAL BYPASS GRAFT Left 11/27/2012    Procedure: BYPASS GRAFT LEFT ABOVE KNEE TO BELOW KNEE POPLITEAL ARTERY USING LEFT NONREVERSED GREATER SAPPHENOUS VEIN;  Surgeon: Serafina Mitchell, MD;  Location: [place] OR;  Service: Vascular;  Laterality: Left;  . I&D EXTREMITY Left 06/10/2013   leg; "from the OR in 11/2012"  . I&D EXTREMITY Left 06/10/2013   Procedure: IRRIGATION AND DEBRIDEMENT OF LEFT UPPER LEG;  Surgeon: Serafina Mitchell, MD;  Location: [place];  Service: Vascular;  Laterality: Left;  . LOWER EXTREMITY ANGIOGRAM N/A 10/01/2012   Procedure: LOWER EXTREMITY ANGIOGRAM;  Surgeon: Jettie Booze, MD;  Location: [facility] C[facility] LAB;  Service: Cardiovascular;  Laterality: N/A;  . POLYPECTOMY     stomach  . PYLOROPLASTY    . TONSILLECTOMY    . TRANSURETHRAL RESECTION OF PROSTATE    . UPPER GASTROINTESTINAL ENDOSCOPY      Allergies  Allergen Reactions  . Penicillins Other (See Comments)    Boils    Current Outpatient Medications  Medication Sig Dispense Refill  . acetaminophen (TYLENOL) 325 MG tablet Take 650 mg by mouth at bedtime.     Marland Kitchen amLODipine (NORVASC) 5 MG tablet Take 5 mg by mouth at bedtime.     . clopidogrel (PLAVIX) 75 MG tablet Take 1 tablet (75 mg total) by mouth daily. 15 tablet 0  . levothyroxine (SYNTHROID, LEVOTHROID) 125 MCG tablet Take 100 mcg by mouth at bedtime.     . Multiple Vitamins-Iron (CHLORELLA PO) Take 1 tablet by mouth daily as needed (meal supplement).    Marland Kitchen omeprazole (PRILOSEC) 40 MG capsule TAKE 1 CAPSULE BY MOUTH  DAILY 90 capsule 2  . OVER THE COUNTER MEDICATION Place 2 sprays into both nostrils at bedtime. Over the counter nasal spray.    . Probiotic Product (PROBIOTIC PO) Take by mouth daily. Colon support OTC    . rosuvastatin (CRESTOR) 10 MG tablet Take 10 mg by mouth daily.     Current Facility-Administered Medications  Medication Dose Route Frequency Provider Last Rate Last Dose  . 0.9 %  sodium chloride infusion  500 mL Intravenous Continuous Irene Shipper, MD        ROS: See HPI for  pertinent positives and negatives.   Physical Examination  Vitals:   07/06/18 1403 07/06/18 1405  BP: (!) 141/63 (!) 143/66  Resp: 14   Temp: 97.6 F (36.4 C)   TempSrc: Temporal   Weight: 199 lb 4.8 oz (90.4 kg)   Height: _0  (1.753 m)    Body mass index is 29.43 kg/m.  General: A&O x 3, WDWN, male. Gait: normal HENT: No gross abnormalities.  Eyes: PERRLA. Pulmonary: Respirations are non labored, CTAB, good air movement in all fields Cardiac: regular rhythm, no detected murmur.         Carotid Bruits Right Left   Negative Negative   Radial pulses are 2+ palpable bilaterally   Adominal aortic pulse is not palpable                         VASCULAR EXAM: Extremities without ischemic changes, without Gangrene; without open wounds. Pretibial pitting edema: 1+ right, 2+ left                                                                                                          LE Pulses Right Left       FEMORAL  2+ palpable  2+ palpable        POPLITEAL  faintly palpable   faintly palpable       POSTERIOR TIBIAL  not palpable   not palpable        DORSALIS PEDIS      ANTERIOR TIBIAL 1+ palpable  2+ palpable    Abdomen: soft, NT, no palpable masses. Skin: no rashes, no cellulitis, no ulcers noted. Musculoskeletal: no muscle wasting or atrophy.  Neurologic: A&O X 3; appropriate affect, Sensation is normal; MOTOR FUNCTION:  moving all extremities equally, motor strength 5/5 throughout. Speech is fluent/normal. CN 2-12 intact. Psychiatric: Thought content is normal, mood appropriate for clinical situation.    ASSESSMENT: Walter [name] is a 85 y.o. male who is s/p left femoral popliteal bypass graft using non reversed greater saphenous vein on 11/27/12.  His risk factors for PAD include former smoker until 1981,exposure to chronic second hand smoke in his office until 2005, and CKD stage 3. Fortunately he does not have DM.  He has no claudication with walking,  no signs of ischemia in his lower extremities.  Caroid duplex in 2014 showed <40% bilateral ICA stenosis with significant right ECA stenosis, no change in carotid stenosis, duplex on 07-03-17.  He states that he wears his knee high compression hose daily, has worn since November 2014.   I advised pt to discuss with his PCP whether he would benefit from a spine evaluation.    DATA  07-06-18 Left LE Arterial Duplex: Left Graft #1: Above knee to below knee poplitleal BPG +--------------------+--------+--------+---------+--------+                     PSV cm/sStenosisWaveform Comments +--------------------+--------+--------+---------+--------+ Inflow              116             triphasic         +--------------------+--------+--------+---------+--------+ Proximal Anastomosis137             triphasic         +--------------------+--------+--------+---------+--------+ Proximal Graft      69  triphasic         +--------------------+--------+--------+---------+--------+ Mid Graft           81              triphasic         +--------------------+--------+--------+---------+--------+ Distal Graft        69              triphasic         +--------------------+--------+--------+---------+--------+ Distal Anastamosis  109             triphasic         +--------------------+--------+--------+---------+--------+ Outflow             25              biphasic          +--------------------+--------+--------+---------+--------+ Summary: Left Graft(s): Above knee to below knee poplitleal BPG bypass graft patent with no evidence of stenosis noted.   ABI (Date: 07/06/2018): ABI Findings: +---------+------------------+-----+---------+--------+ Right    Rt Pressure (mmHg)IndexWaveform Comment  +---------+------------------+-----+---------+--------+ Brachial 144                    triphasic          +---------+------------------+-----+---------+--------+ PTA      125               0.77 biphasic          +---------+------------------+-----+---------+--------+ DP       119               0.73 biphasic          +---------+------------------+-----+---------+--------+ Great Toe85                0.52 Abnormal          +---------+------------------+-----+---------+--------+  +---------+------------------+-----+----------+-------+ Left     Lt Pressure (mmHg)IndexWaveform  Comment +---------+------------------+-----+----------+-------+ Brachial 162                    triphasic         +---------+------------------+-----+----------+-------+ PTA      130               0.80 biphasic          +---------+------------------+-----+----------+-------+ DP       158               0.98 monophasic        +---------+------------------+-----+----------+-------+ Great Toe141               0.87 Normal            +---------+------------------+-----+----------+-------+  +-------+-----------+-----------+------------+------------+ ABI/TBIToday's ABIToday's TBIPrevious ABIPrevious TBI +-------+-----------+-----------+------------+------------+ Right  0.77       0.52       0.90        0.70         +-------+-----------+-----------+------------+------------+ Left   0.98       0.87       0.95        0.67         +-------+-----------+-----------+------------+------------+  Right ABIs and TBIs appear decreased compared to prior study on 07/03/2017. Left ABIs appear essentially unchanged compared to prior study on 07/03/2017. Left TBIs appear increased compared to prior study 07/03/2017.   Summary: Right: Resting right ankle-brachial index indicates moderate right lower extremity arterial disease. The right toe-brachial index is abnormal. RT great toe pressure = 85 mmHg.   Carotid Duplex (07-03-17): 1-39% bilateral ICA stenosis Right ECA: >50%  stenosis Bilateral  vertebral artery flow is antegrade.  Bilateral subclavian artery waveforms are normal.  No change compared to the exam in 2014.    PLAN:  Graduated walking program, walk at least 30 minutes daily. Based on the patient's vascular studies and examination, pt will return to clinic in1 yearwith ABI's,left LE arterial duplex,  Carotid duplex in 2-3 years.I advised pt to notify us if he develops concerns re the circulation in his feet or legs.   I discussed in depth with the patient the nature of atherosclerosis, and emphasized the importance of maximal medical management including strict control of blood pressure, blood glucose, and lipid levels, obtaining regular exercise, and continued cessation of smoking.  The patient is aware that without maximal medical management the underlying atherosclerotic disease process will progress, limiting the benefit of any interventions.  The patient was given information about PAD including signs, symptoms, treatment, what symptoms should prompt the patient to seek immediate medical care, and risk reduction measures to take.  Clemon Chambers, RN, MSN, FNP-C [facility] Phone: [phone]  Clinic MD: Trula Slade  07/06/18 3:00 PM

## 2018-09-16 ENCOUNTER — Other Ambulatory Visit: Payer: Self-pay | Admitting: Internal Medicine

## 2018-12-07 ENCOUNTER — Other Ambulatory Visit: Payer: Self-pay | Admitting: Internal Medicine

## 2019-02-04 ENCOUNTER — Ambulatory Visit: Payer: Medicare Other | Admitting: Nurse Practitioner

## 2019-02-10 ENCOUNTER — Ambulatory Visit: Payer: Medicare Other | Admitting: Nurse Practitioner

## 2019-02-10 ENCOUNTER — Encounter: Payer: Self-pay | Admitting: Nurse Practitioner

## 2019-02-10 VITALS — BP 140/60 | HR 51 | Temp 97.8°F | Ht 69.0 in | Wt 196.0 lb

## 2019-02-10 DIAGNOSIS — K5909 Other constipation: Secondary | ICD-10-CM | POA: Diagnosis not present

## 2019-02-10 DIAGNOSIS — K2270 Barrett's esophagus without dysplasia: Secondary | ICD-10-CM | POA: Diagnosis not present

## 2019-02-10 DIAGNOSIS — K219 Gastro-esophageal reflux disease without esophagitis: Secondary | ICD-10-CM

## 2019-02-10 MED ORDER — OMEPRAZOLE 40 MG PO CPDR: 40.0000 mg | DELAYED_RELEASE_CAPSULE | Freq: Every day | ORAL | 4 refills | Status: DC

## 2019-02-10 NOTE — Patient Instructions (Signed)
If you are age 84 or older, your body mass index should be between 23-30. Your Body mass index is 28.94 kg/m. If this is out of the aforementioned range listed, please consider follow up with your Primary Care Provider.  If you are age 53 or younger, your body mass index should be between 19-25. Your Body mass index is 28.94 kg/m. If this is out of the aformentioned range listed, please consider follow up with your Primary Care Provider.   We have sent the following medications to your pharmacy for you to pick up at your convenience: Omeprazole.

## 2019-02-10 NOTE — Progress Notes (Signed)
Assessment and plan reviewed 

## 2019-02-10 NOTE — Progress Notes (Signed)
IMPRESSION and PLAN:    86 yo male with long segment Barrett's esophagus. Remote low grade dysplasia, none on last EGD 2018. No GERD symptoms. Doing well --No longer requiring surveillance EGDs --Refill Prilosec 40 mg daily # 90, refills for a year.   Chronic constipation --bowels moving well after starting nightly dose of mylanta  HPI:    Primary GI: Dr. Henrene Pastor  Chief complaint : medication refill.    Patient is an 86 yo male with a pmh including but not limited to hypothyroidism, HTN, CKD, PAD s/p popliteal bypass graft in 2014 on chronic plavix, chronic constipation, adenomatous colon polyps, diverticulosis,  and Barrett's esophagus with low grade dysplasia (none on last EGD with biopsies Sept 2018).     Walter [name] needs a refill on Prilosec. Takes Prilosec 40 mg Q HS and has no GERD symptoms.  He does have chronic constipaition. Linzess caused abdominal pain. Several months ago started taking a dose of mylanta at night. Since then no further constipation / abdominal pain   Review of systems:     No chest pain, no SOB, no fevers, no urinary sx   Past Medical History:  Diagnosis Date  . Anemia    pt denies this  . Anxiety   . Arthritis    "legs and feet" (06/10/2013)  . Barrett's esophagus    "never dilated" (06/10/2013)  . Cataract   . Chronic kidney disease    stage 3  . Colon polyps    tubular adenoma-last colon 12/18/2005  . Depression   . Diverticulosis   . GERD (gastroesophageal reflux disease)    takes omeprazole  . Hematoma    on mid back  . Hiatal hernia   . High cholesterol   . Hypertension   . Hypothyroidism    takes synthroid  . Peripheral vascular disease ([place])     Patient's surgical history, family medical history, social history, medications and allergies were all reviewed in Epic   Creatinine clearance cannot be calculated (Patient's most recent lab result is older than the maximum 21 days allowed.)  Current Outpatient Medications    Medication Sig Dispense Refill  . acetaminophen (TYLENOL) 500 MG tablet Take 1,000 mg by mouth at bedtime.    Marland Kitchen alum & mag hydroxide-simeth (MAALOX/MYLANTA) 200-200-20 MG/5ML suspension Take 15 mLs by mouth daily.    Marland Kitchen amLODipine (NORVASC) 5 MG tablet Take 5 mg by mouth at bedtime.     . clopidogrel (PLAVIX) 75 MG tablet Take 1 tablet (75 mg total) by mouth daily. 15 tablet 0  . levothyroxine (SYNTHROID, LEVOTHROID) 125 MCG tablet Take 100 mcg by mouth at bedtime.     . Omega 3-6-9 Fatty Acids (OMEGA 3-6-9 COMPLEX PO) Take 2 capsules by mouth daily.    Marland Kitchen omeprazole (PRILOSEC) 40 MG capsule TAKE 1 CAPSULE BY MOUTH  DAILY 30 capsule 0  . OVER THE COUNTER MEDICATION Place 2 sprays into both nostrils at bedtime. Over the counter nasal spray.    . Probiotic Product (PROBIOTIC PO) Take by mouth daily. Colon support OTC    . rosuvastatin (CRESTOR) 10 MG tablet Take 10 mg by mouth daily.     No current facility-administered medications for this visit.    Physical Exam:     BP 140/60   Pulse (!) 51   Temp 97.8 F (36.6 C)   Ht 5\' 9"  (1.753 m)   Wt 196 lb (88.9 kg)   BMI 28.94  kg/m   GENERAL:  Pleasant male in NAD PSYCH: : Cooperative, normal affect EENT:  conjunctiva pink, mucous membranes moist, neck supple without masses CARDIAC:  RRR, no murmur heard, no peripheral edema PULM: Normal respiratory effort, lungs CTA bilaterally, no wheezing ABDOMEN:  Nondistended, soft, nontender. No obvious masses, no hepatomegaly,  normal bowel sounds SKIN:  turgor, no lesions seen Musculoskeletal:  Normal muscle tone, normal strength NEURO: Alert and oriented x 3, no focal neurologic deficits   Walter [name] , NP 02/10/2019, 10:30 AM

## 2019-06-23 ENCOUNTER — Encounter: Payer: Self-pay | Admitting: Gastroenterology

## 2019-06-28 ENCOUNTER — Other Ambulatory Visit: Payer: Self-pay | Admitting: *Deleted

## 2019-06-28 DIAGNOSIS — I779 Disorder of arteries and arterioles, unspecified: Secondary | ICD-10-CM

## 2019-06-28 DIAGNOSIS — Z95828 Presence of other vascular implants and grafts: Secondary | ICD-10-CM

## 2019-07-06 ENCOUNTER — Encounter: Payer: Self-pay | Admitting: Surgery

## 2019-07-09 ENCOUNTER — Ambulatory Visit (HOSPITAL_COMMUNITY)
Admission: RE | Admit: 2019-07-09 | Discharge: 2019-07-09 | Disposition: A | Discharge status: Home / Self Care | Payer: Medicare Other | Source: Ambulatory Visit | Attending: Vascular Surgery | Admitting: Vascular Surgery

## 2019-07-09 ENCOUNTER — Ambulatory Visit: Payer: Medicare Other | Admitting: Physician Assistant

## 2019-07-09 ENCOUNTER — Other Ambulatory Visit: Payer: Self-pay

## 2019-07-09 ENCOUNTER — Ambulatory Visit (INDEPENDENT_AMBULATORY_CARE_PROVIDER_SITE_OTHER)
Admission: RE | Admit: 2019-07-09 | Discharge: 2019-07-09 | Disposition: A | Discharge status: Home / Self Care | Payer: Medicare Other | Source: Ambulatory Visit | Attending: Vascular Surgery | Admitting: Vascular Surgery

## 2019-07-09 VITALS — BP 135/60 | HR 42 | Temp 97.8°F | Resp 20 | Ht 69.0 in | Wt 195.4 lb

## 2019-07-09 DIAGNOSIS — I779 Disorder of arteries and arterioles, unspecified: Secondary | ICD-10-CM | POA: Insufficient documentation

## 2019-07-09 DIAGNOSIS — M545 Low back pain, unspecified: Secondary | ICD-10-CM

## 2019-07-09 DIAGNOSIS — Z95828 Presence of other vascular implants and grafts: Secondary | ICD-10-CM | POA: Diagnosis not present

## 2019-07-09 DIAGNOSIS — G8929 Other chronic pain: Secondary | ICD-10-CM | POA: Diagnosis not present

## 2019-07-09 NOTE — Progress Notes (Signed)
Established Previous Bypass   History of Present Illness   Walter [name] is a 86 y.o. (1933-11-06) male who presents for bypass surveillance.  He underwent L  femoral to AK popliteal bypass with vein by Dr. Trula Slade on 11/26/2013 due to toe ulceration.  His activity is limited by back pain however he denies claudication, non healing wounds, and rest pain of bilateral lower extremities.  He is taking a statin and plavix daily.  He denies tobacco use.  He is following regularly with his PCP for management of HTN.  He has no interest in seeing a spine specialist for lifestyle limiting low back pain.  The patient's PMH, PSH, SH, and FamHx were reviewed and are unchanged from prior visit.  Current Outpatient Medications  Medication Sig Dispense Refill  . acetaminophen (TYLENOL) 500 MG tablet Take 1,000 mg by mouth at bedtime.    Marland Kitchen alum & mag hydroxide-simeth (MAALOX/MYLANTA) 200-200-20 MG/5ML suspension Take 15 mLs by mouth daily.    Marland Kitchen amLODipine (NORVASC) 5 MG tablet Take 5 mg by mouth at bedtime.     . clopidogrel (PLAVIX) 75 MG tablet Take 1 tablet (75 mg total) by mouth daily. 15 tablet 0  . levothyroxine (SYNTHROID, LEVOTHROID) 125 MCG tablet Take 100 mcg by mouth at bedtime.     . Omega 3-6-9 Fatty Acids (OMEGA 3-6-9 COMPLEX PO) Take 2 capsules by mouth daily.    Marland Kitchen omeprazole (PRILOSEC) 40 MG capsule Take 1 capsule (40 mg total) by mouth daily. 90 capsule 4  . OVER THE COUNTER MEDICATION Place 2 sprays into both nostrils at bedtime. Over the counter nasal spray.    . Probiotic Product (PROBIOTIC PO) Take by mouth daily. Colon support OTC    . rosuvastatin (CRESTOR) 10 MG tablet Take 10 mg by mouth daily.     No current facility-administered medications for this visit.    REVIEW OF SYSTEMS (negative unless checked):   Cardiac:  []  Chest pain or chest pressure? []  Shortness of breath upon activity? []  Shortness of breath when lying flat? []  Irregular heart rhythm?  Vascular:  []   Pain in calf, thigh, or hip brought on by walking? []  Pain in feet at night that wakes you up from your sleep? []  Blood clot in your veins? []  Leg swelling?  Pulmonary:  []  Oxygen at home? []  Productive cough? []  Wheezing?  Neurologic:  []  Sudden weakness in arms or legs? []  Sudden numbness in arms or legs? []  Sudden onset of difficult speaking or slurred speech? []  Temporary loss of vision in one eye? []  Problems with dizziness?  Gastrointestinal:  []  Blood in stool? []  Vomited blood?  Genitourinary:  []  Burning when urinating? []  Blood in urine?  Psychiatric:  []  Major depression  Hematologic:  []  Bleeding problems? []  Problems with blood clotting?  Dermatologic:  []  Rashes or ulcers?  Constitutional:  []  Fever or chills?  Ear/Nose/Throat:  []  Change in hearing? []  Nose bleeds? []  Sore throat?  Musculoskeletal:  [x]  Back pain? []  Joint pain? []  Muscle pain?   Physical Examination   Vitals:   07/09/19 1035  BP: 135/60  Pulse: (!) 42  Resp: 20  Temp: 97.8 F (36.6 C)  TempSrc: Temporal  SpO2: 97%  Weight: 195 lb 6.4 oz (88.6 kg)  Height: 5\' 9"  (1.753 m)   Body mass index is 28.86 kg/m.  General:  WDWN in NAD; vital signs documented above Gait: Not observed HENT: WNL, normocephalic Pulmonary: normal non-labored breathing , without Rales,  rhonchi,  wheezing Cardiac: regular HR Abdomen: soft, NT, no masses Skin: without rashes Vascular Exam/Pulses:  Right Left  Radial 2+ (normal) 2+ (normal)  DP absent 2+ (normal)  PT absent absent   Extremities: without ischemic changes, without Gangrene , without cellulitis; without open wounds;  Musculoskeletal: no muscle wasting or atrophy  Neurologic: A&O X 3;  No focal weakness or paresthesias are detected Psychiatric:  The pt has Normal affect.  Non-Invasive Vascular Imaging  ABI  ABI/TBIToday's ABIToday's TBIPrevious ABIPrevious TBI    +-------+-----------+-----------+------------+------------+  Right 0.83    0.61    0.77    0.52      +-------+-----------+-----------+------------+------------+  Left  0.85    0.78    0.98    0.87      Bypass Duplex   Widely patent LLE bypass   Medical Decision Making   Walter [name] is a 86 y.o. male who presents for LLE bypass graft surveillance   LLE well perfused with palpable DP pulse  L leg bypass is widely patent based on graft duplex  Continue plavix and statin daily  Encouraged ambulation; offered patient referral for back pain but he is not interested at this time  Recheck ABIs and bypass duplex in 1 year   Dagoberto Ligas PA-C [facility] [place] Office: [facility] MD: Donzetta Matters

## 2019-10-19 ENCOUNTER — Encounter: Payer: Self-pay | Admitting: Internal Medicine

## 2019-10-19 ENCOUNTER — Ambulatory Visit: Payer: Medicare Other | Admitting: Internal Medicine

## 2019-10-19 VITALS — BP 132/64 | HR 52 | Ht 69.0 in | Wt 195.6 lb

## 2019-10-19 DIAGNOSIS — K219 Gastro-esophageal reflux disease without esophagitis: Secondary | ICD-10-CM

## 2019-10-19 DIAGNOSIS — K5909 Other constipation: Secondary | ICD-10-CM

## 2019-10-19 DIAGNOSIS — K2270 Barrett's esophagus without dysplasia: Secondary | ICD-10-CM

## 2019-10-19 DIAGNOSIS — M6208 Separation of muscle (nontraumatic), other site: Secondary | ICD-10-CM

## 2019-10-19 MED ORDER — OMEPRAZOLE 40 MG PO CPDR: 40.0000 mg | DELAYED_RELEASE_CAPSULE | Freq: Every day | ORAL | 3 refills | Status: DC

## 2019-10-19 NOTE — Patient Instructions (Signed)
We have sent the following medications to your pharmacy for you to pick up at your convenience:  Omeprazole.  Please follow up in one year  

## 2019-10-19 NOTE — Progress Notes (Signed)
HISTORY OF PRESENT ILLNESS:  Walter [name] is a 86 y.o. male with past medical history as listed below who is followed in this office for GERD complicated by Barrett's esophagus, chronic functional abdominal complaints, adenomatous colon polyps.  He presents today for ongoing management of his chronic GERD and constipation.  Also, for evaluation of relatively new issues with abdominal bulging with certain movements.  Last underwent complete colonoscopy July 12, 2016.  He was found to have multiple diminutive colon polyps and pandiverticulosis.  Follow-up in 3 years to be considered.  She last underwent upper endoscopy with biopsy September 20, 2016.  He was found to have nondysplastic Barrett's.  No follow-up recommended.  Patient tells me that he has been compliant with PPI therapy.  He continues on omeprazole 40 mg daily.  No active reflux symptoms.  No dysphagia.  He tells me that he has been taking Mylanta at night.  This has helped his issues with constipation.  No lower GI bleeding.  Terms of the abdominal bulge, this is painless.  Noticed with certain movements such as sitting up.  I am sorry to hear that his wife Silva Bandy is suffering from progressive dementia.  This has been quite burdensome for Moise.  REVIEW OF SYSTEMS:  All non-GI ROS negative less otherwise stated in the HPI except for anxiety, arthritis  Past Medical History:  Diagnosis Date  . Anemia    pt denies this  . Anxiety   . Arthritis    "legs and feet" (06/10/2013)  . Barrett's esophagus    "never dilated" (06/10/2013)  . Cataract   . Chronic kidney disease    stage 3  . Colon polyps    tubular adenoma-last colon 12/18/2005  . Depression   . Diverticulosis   . GERD (gastroesophageal reflux disease)    takes omeprazole  . Hematoma    on mid back  . Hiatal hernia   . High cholesterol   . Hypertension   . Hypothyroidism    takes synthroid  . Peripheral vascular disease [facility])     Past Surgical History:  Procedure  Laterality Date  . CARDIAC CATHETERIZATION    . CATARACT EXTRACTION W/ INTRAOCULAR LENS  IMPLANT, BILATERAL Bilateral   . COLONOSCOPY    . EYE SURGERY    . FEMORAL-POPLITEAL BYPASS GRAFT Left 11/27/2012   Procedure: BYPASS GRAFT LEFT ABOVE KNEE TO BELOW KNEE POPLITEAL ARTERY USING LEFT NONREVERSED GREATER SAPPHENOUS VEIN;  Surgeon: Serafina Mitchell, MD;  Location: [facility];  Service: Vascular;  Laterality: Left;  . I & D EXTREMITY Left 06/10/2013   leg; "from the OR in 11/2012"  . I & D EXTREMITY Left 06/10/2013   Procedure: IRRIGATION AND DEBRIDEMENT OF LEFT UPPER LEG;  Surgeon: Serafina Mitchell, MD;  Location: [facility];  Service: Vascular;  Laterality: Left;  . LOWER EXTREMITY ANGIOGRAM N/A 10/01/2012   Procedure: LOWER EXTREMITY ANGIOGRAM;  Surgeon: Jettie Booze, MD;  Location: [facility] CATH LAB;  Service: Cardiovascular;  Laterality: N/A;  . POLYPECTOMY     stomach  . PYLOROPLASTY    . TONSILLECTOMY    . TRANSURETHRAL RESECTION OF PROSTATE    . UPPER GASTROINTESTINAL ENDOSCOPY      Social History KREW HORTMAN  reports that he quit smoking about 34 years ago. His smoking use included cigarettes. He has a 20.00 pack-year smoking history. He has never used smokeless tobacco. He reports that he does not drink alcohol and does not use drugs.  family history includes  Alzheimer's disease in his sister; Heart disease in his mother and sister; Hypertension in his sister; Lung cancer in his brother; Melanoma in his father.  Allergies  Allergen Reactions  . Penicillins Other (See Comments)    Boils       PHYSICAL EXAMINATION: Vital signs: BP 132/64   Pulse (!) 52   Ht 5\' 9"  (1.753 m)   Wt 195 lb 9.6 oz (88.7 kg)   BMI 28.89 kg/m   Constitutional: generally well-appearing, no acute distress Psychiatric: alert and oriented x3, cooperative Eyes: extraocular movements intact, anicteric, conjunctiva pink Mouth: oral pharynx moist, no lesions Neck: supple no lymphadenopathy Cardiovascular:  heart regular rate and rhythm, no murmur Lungs: clear to auscultation bilaterally Abdomen: soft, nontender, nondistended, no obvious ascites, no peritoneal signs, normal bowel sounds, no organomegaly.  Diastases recti abdominis Rectal: Omitted Extremities: no clubbing, cyanosis, or lower extremity edema bilaterally Skin: no lesions on visible extremities Neuro: No focal deficits.  Cranial nerves intact  ASSESSMENT:  1.  Chronic GERD with Barrett's esophagus.  Last examination nondysplastic.  Asymptomatic on PPI 2.  Chronic constipation.  Improved with antacids 3.  History of multiple adenomatous colon polyps.  Last examination 2018.  Currently asymptomatic 4.  Multiple medical problems 5.  Abdominal wall diastases   PLAN:  1.  Reflux precautions 2.  Continue omeprazole 40 mg daily.  Prescription refilled 3.  Patient has aged out of his surveillance programs.  We discussed this 4.  Reassurance regarding abdominal wall diastases 5.  GI follow-up as needed Total time of 30 minutes was spent.  Seeing patient, reviewing test, obtaining comprehensive interval history, performing comprehensive physical exam, counseling patient regarding his above listed issues, recommending prescribing ongoing medical therapy, and documenting clinical information in the health record

## 2020-02-14 DIAGNOSIS — M545 Low back pain, unspecified: Secondary | ICD-10-CM | POA: Diagnosis not present

## 2020-03-08 DIAGNOSIS — M545 Low back pain, unspecified: Secondary | ICD-10-CM | POA: Diagnosis not present

## 2020-03-10 DIAGNOSIS — M545 Low back pain, unspecified: Secondary | ICD-10-CM | POA: Diagnosis not present

## 2020-03-14 DIAGNOSIS — M545 Low back pain, unspecified: Secondary | ICD-10-CM | POA: Diagnosis not present

## 2020-03-20 DIAGNOSIS — M545 Low back pain, unspecified: Secondary | ICD-10-CM | POA: Diagnosis not present

## 2020-03-22 DIAGNOSIS — M545 Low back pain, unspecified: Secondary | ICD-10-CM | POA: Diagnosis not present

## 2020-04-07 DIAGNOSIS — R079 Chest pain, unspecified: Secondary | ICD-10-CM | POA: Diagnosis not present

## 2020-04-07 DIAGNOSIS — K219 Gastro-esophageal reflux disease without esophagitis: Secondary | ICD-10-CM | POA: Diagnosis not present

## 2020-05-26 ENCOUNTER — Other Ambulatory Visit: Payer: Self-pay | Admitting: *Deleted

## 2020-05-26 DIAGNOSIS — Z95828 Presence of other vascular implants and grafts: Secondary | ICD-10-CM

## 2020-05-26 DIAGNOSIS — I779 Disorder of arteries and arterioles, unspecified: Secondary | ICD-10-CM

## 2020-07-06 DIAGNOSIS — M150 Primary generalized (osteo)arthritis: Secondary | ICD-10-CM | POA: Diagnosis not present

## 2020-07-06 DIAGNOSIS — Z0000 Encounter for general adult medical examination without abnormal findings: Secondary | ICD-10-CM | POA: Diagnosis not present

## 2020-07-06 DIAGNOSIS — N183 Chronic kidney disease, stage 3 unspecified: Secondary | ICD-10-CM | POA: Diagnosis not present

## 2020-07-06 DIAGNOSIS — I739 Peripheral vascular disease, unspecified: Secondary | ICD-10-CM | POA: Diagnosis not present

## 2020-07-06 DIAGNOSIS — E039 Hypothyroidism, unspecified: Secondary | ICD-10-CM | POA: Diagnosis not present

## 2020-07-06 DIAGNOSIS — E782 Mixed hyperlipidemia: Secondary | ICD-10-CM | POA: Diagnosis not present

## 2020-07-06 DIAGNOSIS — I10 Essential (primary) hypertension: Secondary | ICD-10-CM | POA: Diagnosis not present

## 2020-07-06 DIAGNOSIS — K219 Gastro-esophageal reflux disease without esophagitis: Secondary | ICD-10-CM | POA: Diagnosis not present

## 2020-07-17 ENCOUNTER — Ambulatory Visit: Payer: Medicare Other | Admitting: Surgery

## 2020-07-17 ENCOUNTER — Encounter (HOSPITAL_COMMUNITY): Payer: Medicare Other

## 2020-07-17 ENCOUNTER — Other Ambulatory Visit (HOSPITAL_COMMUNITY): Payer: Medicare Other

## 2020-08-14 ENCOUNTER — Other Ambulatory Visit (HOSPITAL_COMMUNITY): Payer: Medicare Other

## 2020-08-14 ENCOUNTER — Inpatient Hospital Stay (HOSPITAL_COMMUNITY): Admission: RE | Admit: 2020-08-14 | Payer: Medicare Other | Source: Ambulatory Visit

## 2020-08-14 ENCOUNTER — Ambulatory Visit: Payer: Medicare Other | Admitting: Surgery

## 2020-08-28 ENCOUNTER — Ambulatory Visit (INDEPENDENT_AMBULATORY_CARE_PROVIDER_SITE_OTHER): Payer: Medicare Other | Admitting: Surgery

## 2020-08-28 ENCOUNTER — Encounter: Payer: Self-pay | Admitting: Surgery

## 2020-08-28 ENCOUNTER — Ambulatory Visit (HOSPITAL_COMMUNITY)
Admission: RE | Admit: 2020-08-28 | Discharge: 2020-08-28 | Disposition: A | Discharge status: Home / Self Care | Payer: Medicare Other | Source: Ambulatory Visit | Attending: Surgery | Admitting: Surgery

## 2020-08-28 ENCOUNTER — Ambulatory Visit (INDEPENDENT_AMBULATORY_CARE_PROVIDER_SITE_OTHER)
Admission: RE | Admit: 2020-08-28 | Discharge: 2020-08-28 | Disposition: A | Discharge status: Home / Self Care | Payer: Medicare Other | Source: Ambulatory Visit | Attending: Surgery | Admitting: Surgery

## 2020-08-28 VITALS — BP 145/70 | HR 48 | Temp 97.7°F | Resp 20 | Ht 69.0 in | Wt 190.0 lb

## 2020-08-28 DIAGNOSIS — Z95828 Presence of other vascular implants and grafts: Secondary | ICD-10-CM | POA: Insufficient documentation

## 2020-08-28 DIAGNOSIS — I779 Disorder of arteries and arterioles, unspecified: Secondary | ICD-10-CM | POA: Insufficient documentation

## 2020-08-28 NOTE — Progress Notes (Signed)
[facility] [place]  Patient name: Walter [name] MRN: [id number] DOB: 09-22-1933 Sex: male   REASON FOR VISIT:    Follow-up  HISOTRY OF PRESENT ILLNESS:    Walter [name] is a 87 y.o. male who is status post left common femoral to above-knee popliteal artery bypass graft with vein on 11/26/2013 for a toe ulcer.  He is back today for follow-up.  He does not have claudication symptoms.  He denies ulcers.  He is medically managed for hypertension and takes a statin for hypercholesterolemia.  He is a non-smoker.   PAST MEDICAL HISTORY:   Past Medical History:  Diagnosis Date   Anemia    pt denies this   Anxiety    Arthritis    "legs and feet" (06/10/2013)   Barrett's esophagus    "never dilated" (06/10/2013)   Cataract    Chronic kidney disease    stage 3   Colon polyps    tubular adenoma-last colon 12/18/2005   Depression    Diverticulosis    GERD (gastroesophageal reflux disease)    takes omeprazole   Hematoma    on mid back   Hiatal hernia    High cholesterol    Hypertension    Hypothyroidism    takes synthroid   Peripheral vascular disease ([place])      FAMILY HISTORY:   Family History  Problem Relation Age of Onset   Heart disease Mother    Lung cancer Brother        and sister   Melanoma Father    Alzheimer's disease Sister        x 2   Heart disease Sister    Hypertension Sister    Colon cancer Neg Hx    Colon polyps Neg Hx    Rectal cancer Neg Hx    Pancreatic cancer Neg Hx    Stomach cancer Neg Hx    Esophageal cancer Neg Hx     SOCIAL HISTORY:   Social History   Tobacco Use   Smoking status: Former    Packs/day: 1.00    Years: 20.00    Pack years: 20.00    Types: Cigarettes    Quit date: 06/28/1985    Years since quitting: 35.1   Smokeless tobacco: Never  Substance Use Topics   Alcohol use: No    Comment: 06/10/2013 "drank recreationally when I did drink; stopped ~ 2010"      ALLERGIES:   Allergies  Allergen Reactions   Penicillins Other (See Comments)    Boils     CURRENT MEDICATIONS:   Current Outpatient Medications  Medication Sig Dispense Refill   acetaminophen (TYLENOL) 500 MG tablet Take 1,000 mg by mouth at bedtime.     alum & mag hydroxide-simeth (MAALOX/MYLANTA) 200-200-20 MG/5ML suspension Take 15 mLs by mouth daily.     amLODipine (NORVASC) 5 MG tablet Take 5 mg by mouth at bedtime.      clopidogrel (PLAVIX) 75 MG tablet Take 1 tablet (75 mg total) by mouth daily. 15 tablet 0   levothyroxine (SYNTHROID, LEVOTHROID) 125 MCG tablet Take 100 mcg by mouth at bedtime.      Omega 3-6-9 Fatty Acids (OMEGA 3-6-9 COMPLEX PO) Take 2 capsules by mouth daily.     omeprazole (PRILOSEC) 40 MG capsule Take 1 capsule (40 mg total) by mouth daily. 90 capsule 3   Probiotic Product (PROBIOTIC PO) Take by mouth daily. Colon support OTC     rosuvastatin (CRESTOR) 10  MG tablet Take 10 mg by mouth daily.     No current facility-administered medications for this visit.    REVIEW OF SYSTEMS:   '[X]'$  denotes positive finding, '[ ]'$  denotes negative finding Cardiac  Comments:  Chest pain or chest pressure:    Shortness of breath upon exertion:    Short of breath when lying flat:    Irregular heart rhythm:        Vascular    Pain in calf, thigh, or hip brought on by ambulation: x   Pain in feet at night that wakes you up from your sleep:  x   Blood clot in your veins:    Leg swelling:         Pulmonary    Oxygen at home:    Productive cough:     Wheezing:         Neurologic    Sudden weakness in arms or legs:     Sudden numbness in arms or legs:     Sudden onset of difficulty speaking or slurred speech:    Temporary loss of vision in one eye:     Problems with dizziness:         Gastrointestinal    Blood in stool:     Vomited blood:         Genitourinary    Burning when urinating:     Blood in urine:        Psychiatric    Major depression:          Hematologic    Bleeding problems:    Problems with blood clotting too easily:        Skin    Rashes or ulcers:        Constitutional    Fever or chills:      PHYSICAL EXAM:   Vitals:   08/28/20 0937  BP: (!) 145/70  Pulse: (!) 48  Resp: 20  Temp: 97.7 F (36.5 C)  SpO2: 96%  Weight: 190 lb (86.2 kg)  Height: '5\' 9"'$  (1.753 m)    GENERAL: The patient is a well-nourished male, in no acute distress. The vital signs are documented above. CARDIAC: There is a regular rate and rhythm.  VASCULAR: He has a palpable left dorsalis pedis pulse PULMONARY: Non-labored respirations MUSCULOSKELETAL: There are no major deformities or cyanosis. NEUROLOGIC: No focal weakness or paresthesias are detected. SKIN: There are no ulcers or rashes noted. PSYCHIATRIC: The patient has a normal affect.  STUDIES:   I have reviewed his vascular studies with the following findings: Right ABI is 0.91 Left ABI is 1.06 The bypass graft is widely patent without stenosis.  MEDICAL ISSUES:   PAD: The patient denies any claudication type symptoms.  His bypass graft is widely patent.  He will return in 1 year for follow-up duplex.    Leia Alf, MD, FACS [facility] [phone] Pager [phone]

## 2020-08-29 ENCOUNTER — Other Ambulatory Visit: Payer: Self-pay

## 2020-10-23 ENCOUNTER — Other Ambulatory Visit: Payer: Self-pay | Admitting: Internal Medicine

## 2020-11-15 DIAGNOSIS — L603 Nail dystrophy: Secondary | ICD-10-CM | POA: Diagnosis not present

## 2020-11-15 DIAGNOSIS — I739 Peripheral vascular disease, unspecified: Secondary | ICD-10-CM | POA: Diagnosis not present

## 2021-01-02 DIAGNOSIS — M545 Low back pain, unspecified: Secondary | ICD-10-CM | POA: Diagnosis not present

## 2021-01-02 DIAGNOSIS — E782 Mixed hyperlipidemia: Secondary | ICD-10-CM | POA: Diagnosis not present

## 2021-01-02 DIAGNOSIS — N183 Chronic kidney disease, stage 3 unspecified: Secondary | ICD-10-CM | POA: Diagnosis not present

## 2021-01-02 DIAGNOSIS — K219 Gastro-esophageal reflux disease without esophagitis: Secondary | ICD-10-CM | POA: Diagnosis not present

## 2021-01-02 DIAGNOSIS — E039 Hypothyroidism, unspecified: Secondary | ICD-10-CM | POA: Diagnosis not present

## 2021-01-02 DIAGNOSIS — I10 Essential (primary) hypertension: Secondary | ICD-10-CM | POA: Diagnosis not present

## 2021-01-02 DIAGNOSIS — I739 Peripheral vascular disease, unspecified: Secondary | ICD-10-CM | POA: Diagnosis not present

## 2021-01-02 DIAGNOSIS — Z23 Encounter for immunization: Secondary | ICD-10-CM | POA: Diagnosis not present

## 2021-02-14 DIAGNOSIS — I739 Peripheral vascular disease, unspecified: Secondary | ICD-10-CM | POA: Diagnosis not present

## 2021-02-14 DIAGNOSIS — L603 Nail dystrophy: Secondary | ICD-10-CM | POA: Diagnosis not present

## 2021-02-21 ENCOUNTER — Inpatient Hospital Stay (HOSPITAL_COMMUNITY)
Admission: EM | Admit: 2021-02-21 | Discharge: 2021-02-28 | DRG: 391 | Disposition: A | Discharge status: Home Health | Payer: Medicare Other | Attending: Internal Medicine | Admitting: Internal Medicine

## 2021-02-21 ENCOUNTER — Encounter (HOSPITAL_COMMUNITY): Payer: Self-pay | Admitting: Internal Medicine

## 2021-02-21 ENCOUNTER — Emergency Department (HOSPITAL_COMMUNITY): Payer: Medicare Other

## 2021-02-21 ENCOUNTER — Other Ambulatory Visit: Payer: Self-pay

## 2021-02-21 DIAGNOSIS — E860 Dehydration: Secondary | ICD-10-CM | POA: Diagnosis not present

## 2021-02-21 DIAGNOSIS — M4313 Spondylolisthesis, cervicothoracic region: Secondary | ICD-10-CM | POA: Diagnosis not present

## 2021-02-21 DIAGNOSIS — E785 Hyperlipidemia, unspecified: Secondary | ICD-10-CM | POA: Diagnosis present

## 2021-02-21 DIAGNOSIS — Z87891 Personal history of nicotine dependence: Secondary | ICD-10-CM

## 2021-02-21 DIAGNOSIS — Z820 Family history of epilepsy and other diseases of the nervous system: Secondary | ICD-10-CM

## 2021-02-21 DIAGNOSIS — M5031 Other cervical disc degeneration,  high cervical region: Secondary | ICD-10-CM | POA: Diagnosis not present

## 2021-02-21 DIAGNOSIS — Z20822 Contact with and (suspected) exposure to covid-19: Secondary | ICD-10-CM | POA: Diagnosis not present

## 2021-02-21 DIAGNOSIS — W2201XA Walked into wall, initial encounter: Secondary | ICD-10-CM | POA: Diagnosis present

## 2021-02-21 DIAGNOSIS — Z79890 Hormone replacement therapy (postmenopausal): Secondary | ICD-10-CM | POA: Diagnosis not present

## 2021-02-21 DIAGNOSIS — E039 Hypothyroidism, unspecified: Secondary | ICD-10-CM | POA: Diagnosis present

## 2021-02-21 DIAGNOSIS — I739 Peripheral vascular disease, unspecified: Secondary | ICD-10-CM | POA: Diagnosis not present

## 2021-02-21 DIAGNOSIS — Z66 Do not resuscitate: Secondary | ICD-10-CM | POA: Diagnosis not present

## 2021-02-21 DIAGNOSIS — E7800 Pure hypercholesterolemia, unspecified: Secondary | ICD-10-CM | POA: Diagnosis present

## 2021-02-21 DIAGNOSIS — R112 Nausea with vomiting, unspecified: Secondary | ICD-10-CM | POA: Diagnosis present

## 2021-02-21 DIAGNOSIS — Z79899 Other long term (current) drug therapy: Secondary | ICD-10-CM | POA: Diagnosis not present

## 2021-02-21 DIAGNOSIS — S3992XA Unspecified injury of lower back, initial encounter: Secondary | ICD-10-CM | POA: Diagnosis not present

## 2021-02-21 DIAGNOSIS — I129 Hypertensive chronic kidney disease with stage 1 through stage 4 chronic kidney disease, or unspecified chronic kidney disease: Secondary | ICD-10-CM | POA: Diagnosis not present

## 2021-02-21 DIAGNOSIS — N183 Chronic kidney disease, stage 3 unspecified: Secondary | ICD-10-CM | POA: Diagnosis present

## 2021-02-21 DIAGNOSIS — Z961 Presence of intraocular lens: Secondary | ICD-10-CM | POA: Diagnosis not present

## 2021-02-21 DIAGNOSIS — R55 Syncope and collapse: Secondary | ICD-10-CM | POA: Diagnosis not present

## 2021-02-21 DIAGNOSIS — Z880 Allergy status to penicillin: Secondary | ICD-10-CM | POA: Diagnosis not present

## 2021-02-21 DIAGNOSIS — N179 Acute kidney failure, unspecified: Secondary | ICD-10-CM | POA: Diagnosis not present

## 2021-02-21 DIAGNOSIS — E861 Hypovolemia: Secondary | ICD-10-CM | POA: Diagnosis present

## 2021-02-21 DIAGNOSIS — S0001XA Abrasion of scalp, initial encounter: Secondary | ICD-10-CM | POA: Diagnosis present

## 2021-02-21 DIAGNOSIS — S199XXA Unspecified injury of neck, initial encounter: Secondary | ICD-10-CM | POA: Diagnosis not present

## 2021-02-21 DIAGNOSIS — I10 Essential (primary) hypertension: Secondary | ICD-10-CM | POA: Diagnosis present

## 2021-02-21 DIAGNOSIS — Z7901 Long term (current) use of anticoagulants: Secondary | ICD-10-CM

## 2021-02-21 DIAGNOSIS — Z9842 Cataract extraction status, left eye: Secondary | ICD-10-CM

## 2021-02-21 DIAGNOSIS — I951 Orthostatic hypotension: Secondary | ICD-10-CM | POA: Diagnosis present

## 2021-02-21 DIAGNOSIS — A0811 Acute gastroenteropathy due to Norwalk agent: Principal | ICD-10-CM | POA: Diagnosis present

## 2021-02-21 DIAGNOSIS — Y92009 Unspecified place in unspecified non-institutional (private) residence as the place of occurrence of the external cause: Secondary | ICD-10-CM

## 2021-02-21 DIAGNOSIS — R918 Other nonspecific abnormal finding of lung field: Secondary | ICD-10-CM | POA: Diagnosis not present

## 2021-02-21 DIAGNOSIS — M545 Low back pain, unspecified: Secondary | ICD-10-CM | POA: Diagnosis not present

## 2021-02-21 DIAGNOSIS — Z8249 Family history of ischemic heart disease and other diseases of the circulatory system: Secondary | ICD-10-CM

## 2021-02-21 DIAGNOSIS — M50321 Other cervical disc degeneration at C4-C5 level: Secondary | ICD-10-CM | POA: Diagnosis not present

## 2021-02-21 DIAGNOSIS — R9389 Abnormal findings on diagnostic imaging of other specified body structures: Secondary | ICD-10-CM | POA: Diagnosis present

## 2021-02-21 DIAGNOSIS — K219 Gastro-esophageal reflux disease without esophagitis: Secondary | ICD-10-CM | POA: Diagnosis present

## 2021-02-21 DIAGNOSIS — J3489 Other specified disorders of nose and nasal sinuses: Secondary | ICD-10-CM | POA: Diagnosis not present

## 2021-02-21 DIAGNOSIS — Z743 Need for continuous supervision: Secondary | ICD-10-CM | POA: Diagnosis not present

## 2021-02-21 DIAGNOSIS — S0990XA Unspecified injury of head, initial encounter: Secondary | ICD-10-CM | POA: Diagnosis not present

## 2021-02-21 DIAGNOSIS — M542 Cervicalgia: Secondary | ICD-10-CM | POA: Diagnosis not present

## 2021-02-21 DIAGNOSIS — J189 Pneumonia, unspecified organism: Secondary | ICD-10-CM | POA: Diagnosis present

## 2021-02-21 DIAGNOSIS — Z9841 Cataract extraction status, right eye: Secondary | ICD-10-CM

## 2021-02-21 DIAGNOSIS — S3993XA Unspecified injury of pelvis, initial encounter: Secondary | ICD-10-CM | POA: Diagnosis not present

## 2021-02-21 DIAGNOSIS — M549 Dorsalgia, unspecified: Secondary | ICD-10-CM | POA: Diagnosis not present

## 2021-02-21 DIAGNOSIS — R197 Diarrhea, unspecified: Secondary | ICD-10-CM | POA: Diagnosis present

## 2021-02-21 DIAGNOSIS — R519 Headache, unspecified: Secondary | ICD-10-CM | POA: Diagnosis not present

## 2021-02-21 DIAGNOSIS — S299XXA Unspecified injury of thorax, initial encounter: Secondary | ICD-10-CM | POA: Diagnosis not present

## 2021-02-21 DIAGNOSIS — R1111 Vomiting without nausea: Secondary | ICD-10-CM | POA: Diagnosis not present

## 2021-02-21 LAB — I-STAT CHEM 8, ED
BUN: 59 mg/dL — ABNORMAL HIGH (ref 8–23)
Calcium, Ion: 1.06 mmol/L — ABNORMAL LOW (ref 1.15–1.40)
Chloride: 110 mmol/L (ref 98–111)
Creatinine, Ser: 2 mg/dL — ABNORMAL HIGH (ref 0.61–1.24)
Glucose, Bld: 149 mg/dL — ABNORMAL HIGH (ref 70–99)
HCT: 45 % (ref 39.0–52.0)
Hemoglobin: 15.3 g/dL (ref 13.0–17.0)
Potassium: 4.8 mmol/L (ref 3.5–5.1)
Sodium: 136 mmol/L (ref 135–145)
TCO2: 21 mmol/L — ABNORMAL LOW (ref 22–32)

## 2021-02-21 LAB — CBC
HCT: 43.3 % (ref 39.0–52.0)
Hemoglobin: 14.8 g/dL (ref 13.0–17.0)
MCH: 32.7 pg (ref 26.0–34.0)
MCHC: 34.2 g/dL (ref 30.0–36.0)
MCV: 95.8 fL (ref 80.0–100.0)
Platelets: 174 10*3/uL (ref 150–400)
RBC: 4.52 MIL/uL (ref 4.22–5.81)
RDW: 13.8 % (ref 11.5–15.5)
WBC: 12.8 10*3/uL — ABNORMAL HIGH (ref 4.0–10.5)
nRBC: 0 % (ref 0.0–0.2)

## 2021-02-21 LAB — CBG MONITORING, ED: Glucose-Capillary: 134 mg/dL — ABNORMAL HIGH (ref 70–99)

## 2021-02-21 LAB — COMPREHENSIVE METABOLIC PANEL
ALT: 16 U/L (ref 0–44)
AST: 27 U/L (ref 15–41)
Albumin: 3.2 g/dL — ABNORMAL LOW (ref 3.5–5.0)
Alkaline Phosphatase: 52 U/L (ref 38–126)
Anion gap: 12 (ref 5–15)
BUN: 49 mg/dL — ABNORMAL HIGH (ref 8–23)
CO2: 19 mmol/L — ABNORMAL LOW (ref 22–32)
Calcium: 8.7 mg/dL — ABNORMAL LOW (ref 8.9–10.3)
Chloride: 106 mmol/L (ref 98–111)
Creatinine, Ser: 2.21 mg/dL — ABNORMAL HIGH (ref 0.61–1.24)
GFR, Estimated: 28 mL/min — ABNORMAL LOW (ref >60)
Glucose, Bld: 151 mg/dL — ABNORMAL HIGH (ref 70–99)
Potassium: 4.5 mmol/L (ref 3.5–5.1)
Sodium: 137 mmol/L (ref 135–145)
Total Bilirubin: 0.8 mg/dL (ref 0.3–1.2)
Total Protein: 5.9 g/dL — ABNORMAL LOW (ref 6.5–8.1)

## 2021-02-21 LAB — RESP PANEL BY RT-PCR (FLU A&B, COVID) ARPGX2
Influenza A by PCR: NEGATIVE
Influenza B by PCR: NEGATIVE
SARS Coronavirus 2 by RT PCR: NEGATIVE

## 2021-02-21 LAB — LACTIC ACID, PLASMA: Lactic Acid, Venous: 2.8 mmol/L (ref 0.5–1.9)

## 2021-02-21 LAB — PROTIME-INR
INR: 1.2 (ref 0.8–1.2)
Prothrombin Time: 14.9 seconds (ref 11.4–15.2)

## 2021-02-21 LAB — ETHANOL: Alcohol, Ethyl (B): <10 mg/dL (ref <10)

## 2021-02-21 MED ORDER — ONDANSETRON HCL 4 MG/2ML IJ SOLN
4.0000 mg | Freq: Once | INTRAMUSCULAR | Status: AC
  Administered 2021-02-21: 4 mg via INTRAVENOUS
  Filled 2021-02-21: qty 2

## 2021-02-21 MED ORDER — MORPHINE SULFATE (PF) 2 MG/ML IV SOLN: 2.0000 mg | INTRAVENOUS | Status: DC | PRN

## 2021-02-21 MED ORDER — SODIUM CHLORIDE 0.9% FLUSH
3.0000 mL | Freq: Two times a day (BID) | INTRAVENOUS | Status: DC
  Administered 2021-02-25 – 2021-02-26 (×3): 3 mL via INTRAVENOUS

## 2021-02-21 MED ORDER — AZITHROMYCIN 500 MG IV SOLR
500.0000 mg | Freq: Once | INTRAVENOUS | Status: AC
  Administered 2021-02-21: 500 mg via INTRAVENOUS
  Filled 2021-02-21: qty 5

## 2021-02-21 MED ORDER — MORPHINE SULFATE (PF) 2 MG/ML IV SOLN: 2.0000 mg | Freq: Once | INTRAVENOUS | Status: DC

## 2021-02-21 MED ORDER — LEVOTHYROXINE SODIUM 25 MCG PO TABS
125.0000 ug | ORAL_TABLET | Freq: Every day | ORAL | Status: DC
  Administered 2021-02-22 – 2021-02-28 (×7): 125 ug via ORAL
  Filled 2021-02-21 (×7): qty 1

## 2021-02-21 MED ORDER — AMLODIPINE BESYLATE 5 MG PO TABS
5.0000 mg | ORAL_TABLET | Freq: Every day | ORAL | Status: DC
  Administered 2021-02-21: 5 mg via ORAL
  Filled 2021-02-21: qty 1

## 2021-02-21 MED ORDER — ENOXAPARIN SODIUM 30 MG/0.3ML IJ SOSY
30.0000 mg | PREFILLED_SYRINGE | INTRAMUSCULAR | Status: DC
  Administered 2021-02-21 – 2021-02-22 (×2): 30 mg via SUBCUTANEOUS
  Filled 2021-02-21 (×2): qty 0.3

## 2021-02-21 MED ORDER — ACETAMINOPHEN 325 MG PO TABS: 650.0000 mg | ORAL_TABLET | Freq: Four times a day (QID) | ORAL | Status: DC | PRN

## 2021-02-21 MED ORDER — ONDANSETRON HCL 4 MG/2ML IJ SOLN: 4.0000 mg | Freq: Four times a day (QID) | INTRAMUSCULAR | Status: DC | PRN

## 2021-02-21 MED ORDER — HYDRALAZINE HCL 20 MG/ML IJ SOLN: 5.0000 mg | INTRAMUSCULAR | Status: DC | PRN

## 2021-02-21 MED ORDER — LACTATED RINGERS IV SOLN: INTRAVENOUS | Status: DC

## 2021-02-21 MED ORDER — SODIUM CHLORIDE 0.9 % IV SOLN
1.0000 g | Freq: Once | INTRAVENOUS | Status: AC
  Administered 2021-02-21: 1 g via INTRAVENOUS
  Filled 2021-02-21: qty 10

## 2021-02-21 MED ORDER — CLOPIDOGREL BISULFATE 75 MG PO TABS
75.0000 mg | ORAL_TABLET | Freq: Every day | ORAL | Status: DC
  Administered 2021-02-21 – 2021-02-28 (×8): 75 mg via ORAL
  Filled 2021-02-21 (×8): qty 1

## 2021-02-21 MED ORDER — ROSUVASTATIN CALCIUM 5 MG PO TABS
10.0000 mg | ORAL_TABLET | Freq: Every day | ORAL | Status: DC
  Administered 2021-02-21 – 2021-02-28 (×8): 10 mg via ORAL
  Filled 2021-02-21 (×8): qty 2

## 2021-02-21 MED ORDER — ONDANSETRON HCL 4 MG PO TABS: 4.0000 mg | ORAL_TABLET | Freq: Four times a day (QID) | ORAL | Status: DC | PRN

## 2021-02-21 MED ORDER — OXYCODONE HCL 5 MG PO TABS: 5.0000 mg | ORAL_TABLET | ORAL | Status: DC | PRN

## 2021-02-21 MED ORDER — SACCHAROMYCES BOULARDII 250 MG PO CAPS
250.0000 mg | ORAL_CAPSULE | Freq: Every day | ORAL | Status: DC
  Administered 2021-02-21 – 2021-02-23 (×3): 250 mg via ORAL
  Filled 2021-02-21 (×3): qty 1

## 2021-02-21 MED ORDER — LACTATED RINGERS IV BOLUS
1000.0000 mL | Freq: Once | INTRAVENOUS | Status: AC
  Administered 2021-02-21: 1000 mL via INTRAVENOUS

## 2021-02-21 MED ORDER — LEVOTHYROXINE SODIUM 100 MCG PO TABS: 100.0000 ug | ORAL_TABLET | Freq: Every day | ORAL | Status: DC

## 2021-02-21 MED ORDER — MECLIZINE HCL 25 MG PO TABS
25.0000 mg | ORAL_TABLET | Freq: Three times a day (TID) | ORAL | Status: DC | PRN
  Administered 2021-02-21: 25 mg via ORAL
  Filled 2021-02-21: qty 1

## 2021-02-21 MED ORDER — MELATONIN 3 MG PO TABS
3.0000 mg | ORAL_TABLET | Freq: Every day | ORAL | Status: DC
  Administered 2021-02-21 – 2021-02-27 (×7): 3 mg via ORAL
  Filled 2021-02-21 (×7): qty 1

## 2021-02-21 MED ORDER — PANTOPRAZOLE SODIUM 40 MG PO TBEC
40.0000 mg | DELAYED_RELEASE_TABLET | Freq: Every day | ORAL | Status: DC
  Administered 2021-02-21 – 2021-02-28 (×8): 40 mg via ORAL
  Filled 2021-02-21 (×8): qty 1

## 2021-02-21 MED ORDER — FENTANYL CITRATE PF 50 MCG/ML IJ SOSY
50.0000 ug | PREFILLED_SYRINGE | Freq: Once | INTRAMUSCULAR | Status: AC
  Administered 2021-02-21: 50 ug via INTRAVENOUS
  Filled 2021-02-21: qty 1

## 2021-02-21 MED ORDER — ACETAMINOPHEN 500 MG PO TABS
1000.0000 mg | ORAL_TABLET | Freq: Every day | ORAL | Status: DC
  Administered 2021-02-21 – 2021-02-27 (×7): 1000 mg via ORAL
  Filled 2021-02-21 (×7): qty 2

## 2021-02-21 MED ORDER — ACETAMINOPHEN 650 MG RE SUPP
650.0000 mg | Freq: Four times a day (QID) | RECTAL | Status: DC | PRN
Start: 2021-02-21 — End: 2021-02-28

## 2021-02-21 NOTE — Progress Notes (Signed)
Visited with patient and provided ministry of presence , spiritual and emotional support. Pt. Here due to fall.  Pt was inquiring about where was his cell phone. The nurse thinks he came in without one. Chaplain will follow as needed.  Jaclynn Major, [place], [facility], Pager [phone]

## 2021-02-21 NOTE — Assessment & Plan Note (Signed)
-  CXR with possible infiltrates -CT thoracic with multilobal PNA -The patient does not currently complain of respiratory symptoms, is on RA with minimal leukocytosis -He was given Rocephin and Azithromycin in the ER -Will hold further antibiotics given no current clinical correlation

## 2021-02-21 NOTE — Assessment & Plan Note (Signed)
-  Uncertain baseline creatinine - none in Epic since 2016 -Likely pre-renal azotemia to some extent -Will hydrate and follow -Avoid nephrotoxic medications

## 2021-02-21 NOTE — Assessment & Plan Note (Signed)
-  Patient with acute onset of n/v/d overnight followed by syncopal episodes -He was found to have AKI, very likely hypovolemic syncope -He is still having some room spinning and so there may be a vertigo component; will give meclizine -Will observe overnight on telemetry in the hospital. -Orthostatic vital signs now and in AM

## 2021-02-21 NOTE — Assessment & Plan Note (Signed)
-  I have discussed code status with the patient and he would not desire resuscitation and would prefer to die a natural death should that situation arise. -He will need a gold out of facility DNR form at the time of discharge

## 2021-02-21 NOTE — Assessment & Plan Note (Addendum)
-  Acute onset of symptoms, appear to have resolved -Likely viral gastroenteritis -If symptoms recur, consider stool studies -Resume probiotics

## 2021-02-21 NOTE — ED Provider Notes (Signed)
[facility] EMERGENCY DEPARTMENT Provider Note  CSN: [id number] Arrival date & time: 02/21/21 2683  Chief Complaint(s) Fall and Loss of Consciousness  HPI Walter [name] is a 88 y.o. male with PMH CKD 3, hiatal hernia, hypothyroidism, peripheral vascular disease on Plavix who presents emergency department for evaluation of a fall on blood thinners.  Patient arrives as a level 2 trauma.  Patient states that he was visiting his wife in the nursing home yesterday and returned suffering multiple episodes of diarrhea and vomiting.  He states that the vomiting was nonbloody nonbilious, no hematochezia.  He states that he then awoke this morning and woke up on the floor.  He arrives with complaints of cervical and thoracic spine pain but denies chest pain, shortness of breath, abdominal pain or other systemic or traumatic complaints.  Denies numbness, tingling, weakness or other neurologic complaints.   Fall  Loss of Consciousness Associated symptoms: nausea and vomiting    Past Medical History Past Medical History:  Diagnosis Date   Anemia    pt denies this   Anxiety    Arthritis    "legs and feet" (06/10/2013)   Barrett's esophagus    "never dilated" (06/10/2013)   Cataract    Chronic kidney disease    stage 3   Colon polyps    tubular adenoma-last colon 12/18/2005   Depression    Diverticulosis    GERD (gastroesophageal reflux disease)    takes omeprazole   Hematoma    on mid back   Hiatal hernia    High cholesterol    Hypertension    Hypothyroidism    takes synthroid   Peripheral vascular disease ([place])    Patient Active Problem List   Diagnosis Date Noted   SIRS (systemic inflammatory response syndrome) ([place]) 05/30/2014   Blood in stool 05/28/2014   Dehydration 05/28/2014   Clostridium difficile colitis 05/28/2014   Acute kidney injury ([place]) 05/28/2014   Abdominal pain    PVD (peripheral vascular disease) ([place]) 10/25/2013   Seroma, infected,  postoperative 06/10/2013   Pain in limb-Left Medial thigh 06/03/2013   Post op infection-Left Medial Thigh 06/03/2013   Atherosclerosis of native arteries of the extremities with ulceration(440.23) 01/04/2013   Peripheral vascular disease ([place]) 11/09/2012   Atherosclerosis of native arteries of the extremities with intermittent claudication 10/19/2012   HEMATOMA 09/06/2009   WEIGHT LOSS-ABNORMAL 05/04/2009   ABDOMINAL PAIN -GENERALIZED 05/04/2009   CHEST PAIN 10/26/2007   COLONIC POLYPS 06/09/2007   HYPOTHYROIDISM 06/09/2007   DEPRESSION 06/09/2007   Essential hypertension 06/09/2007   RESIDUAL HEMORRHOIDAL SKIN TAGS 06/09/2007   GERD 06/09/2007   BARRETTS ESOPHAGUS 06/09/2007   HIATAL HERNIA 06/09/2007   DIVERTICULOSIS, COLON 06/09/2007   ARTHRITIS 06/09/2007   Home Medication(s) Prior to Admission medications   Medication Sig Start Date End Date Taking? Authorizing Provider  acetaminophen (TYLENOL) 500 MG tablet Take 1,000 mg by mouth at bedtime.    [provider]  alum & mag hydroxide-simeth (MAALOX/MYLANTA) 200-200-20 MG/5ML suspension Take 15 mLs by mouth daily.    [provider]  amLODipine (NORVASC) 5 MG tablet Take 5 mg by mouth at bedtime.     [provider]  clopidogrel (PLAVIX) 75 MG tablet Take 1 tablet (75 mg total) by mouth daily. 02/16/16   Jettie Booze, MD  levothyroxine (SYNTHROID, LEVOTHROID) 125 MCG tablet Take 100 mcg by mouth at bedtime.     [provider]  Omega 3-6-9 Fatty Acids (OMEGA 3-6-9 COMPLEX PO)  Take 2 capsules by mouth daily.    [provider]  omeprazole (PRILOSEC) 40 MG capsule TAKE 1 CAPSULE BY MOUTH  DAILY 10/23/20   Irene Shipper, MD  Probiotic Product (PROBIOTIC PO) Take by mouth daily. Colon support OTC    [provider]  rosuvastatin (CRESTOR) 10 MG tablet Take 10 mg by mouth daily.    [provider]                                                                                                                                     Past Surgical History Past Surgical History:  Procedure Laterality Date   CARDIAC CATHETERIZATION     CATARACT EXTRACTION W/ INTRAOCULAR LENS  IMPLANT, BILATERAL Bilateral    COLONOSCOPY     EYE SURGERY     FEMORAL-POPLITEAL BYPASS GRAFT Left 11/27/2012   Procedure: BYPASS GRAFT LEFT ABOVE KNEE TO BELOW KNEE POPLITEAL ARTERY USING LEFT NONREVERSED GREATER SAPPHENOUS VEIN;  Surgeon: Serafina Mitchell, MD;  Location: [place];  Service: Vascular;  Laterality: Left;   I & D EXTREMITY Left 06/10/2013   leg; "from the OR in 11/2012"   I & D EXTREMITY Left 06/10/2013   Procedure: IRRIGATION AND DEBRIDEMENT OF LEFT UPPER LEG;  Surgeon: Serafina Mitchell, MD;  Location: [place];  Service: Vascular;  Laterality: Left;   LOWER EXTREMITY ANGIOGRAM N/A 10/01/2012   Procedure: LOWER EXTREMITY ANGIOGRAM;  Surgeon: Jettie Booze, MD;  Location: [facility] CATH LAB;  Service: Cardiovascular;  Laterality: N/A;   POLYPECTOMY     stomach   PYLOROPLASTY     TONSILLECTOMY     TRANSURETHRAL RESECTION OF PROSTATE     UPPER GASTROINTESTINAL ENDOSCOPY     Family History Family History  Problem Relation Age of Onset   Heart disease Mother    Lung cancer Brother        and sister   Melanoma Father    Alzheimer's disease Sister        x 2   Heart disease Sister    Hypertension Sister    Colon cancer Neg Hx    Colon polyps Neg Hx    Rectal cancer Neg Hx    Pancreatic cancer Neg Hx    Stomach cancer Neg Hx    Esophageal cancer Neg Hx     Social History Social History   Tobacco Use   Smoking status: Former    Packs/day: 1.00    Years: 20.00    Pack years: 20.00    Types: Cigarettes    Quit date: 06/28/1985    Years since quitting: 35.6   Smokeless tobacco: Never  Vaping Use   Vaping Use: Never used  Substance Use Topics   Alcohol use: No    Comment: 06/10/2013 "drank recreationally when I did drink; stopped ~ 2010"   Drug use: No    Allergies Penicillins  Review of Systems Review  of Systems  Cardiovascular:  Positive for syncope.  Gastrointestinal:  Positive for nausea and vomiting.  Musculoskeletal:  Positive for back pain and neck pain.  Neurological:  Positive for syncope.   Physical Exam Vital Signs  I have reviewed the triage vital signs BP (!) 112/54    Pulse 75    Temp 98.8 F (37.1 C) (Oral)    Resp 17    SpO2 100%   Physical Exam Vitals and nursing note reviewed.  Constitutional:      General: He is not in acute distress.    Appearance: He is well-developed.  HENT:     Head: Normocephalic and atraumatic.  Eyes:     Conjunctiva/sclera: Conjunctivae normal.  Cardiovascular:     Rate and Rhythm: Normal rate and regular rhythm.     Heart sounds: No murmur heard. Pulmonary:     Effort: Pulmonary effort is normal. No respiratory distress.     Breath sounds: Normal breath sounds.  Abdominal:     Palpations: Abdomen is soft.     Tenderness: There is no abdominal tenderness.  Musculoskeletal:        General: Tenderness (T-spine) present. No swelling.     Cervical back: Neck supple. Tenderness present.  Skin:    General: Skin is warm and dry.     Capillary Refill: Capillary refill takes less than 2 seconds.  Neurological:     Mental Status: He is alert.  Psychiatric:        Mood and Affect: Mood normal.    ED Results and Treatments Labs (all labs ordered are listed, but only abnormal results are displayed) Labs Reviewed  CBG MONITORING, ED - Abnormal; Notable for the following components:      Result Value   Glucose-Capillary 134 (*)    All other components within normal limits  RESP PANEL BY RT-PCR (FLU A&B, COVID) ARPGX2  COMPREHENSIVE METABOLIC PANEL  CBC  ETHANOL  URINALYSIS, ROUTINE W REFLEX MICROSCOPIC  LACTIC ACID, PLASMA  PROTIME-INR  I-STAT CHEM 8, ED                                                                                                                           Radiology DG Pelvis Portable  Result Date: 02/21/2021 CLINICAL DATA:  Trauma EXAM: PORTABLE PELVIS 1-2 VIEWS COMPARISON:  CT 05/27/2014 FINDINGS: There is no radiographically evident pelvic fracture or femoral neck fracture on single frontal view of the pelvis. There is moderate right and mild left hip osteoarthritis. Lower lumbar spine degenerative changes. IMPRESSION: No evidence of acute fracture on single frontal view of the pelvis. Electronically Signed   By: Maurine Simmering M.D.   On: 02/21/2021 08:00   DG Chest Portable 1 View  Result Date: 02/21/2021 CLINICAL DATA:  Trauma, fall EXAM: PORTABLE CHEST 1 VIEW COMPARISON:  Radiograph 11/24/2012 FINDINGS: The cardiomediastinal silhouette is within normal limits. There is a rounded right lateral mid lung opacity. Faint left basilar opacities. No large pleural  effusion. No visible pneumothorax. No acute fracture on single frontal view of the chest. IMPRESSION: Rounded right lateral mid lung opacity, could represent contusion, focal infection, or potentially a pulmonary nodule given rounded morphology. Faint left basilar opacities which could be atelectasis or infection. Recommend chest CT. Electronically Signed   By: Maurine Simmering M.D.   On: 02/21/2021 07:59    Pertinent labs & imaging results that were available during my care of the patient were reviewed by me and considered in my medical decision making (see MDM for details).  Medications Ordered in ED Medications  lactated ringers bolus 1,000 mL (has no administration in time range)  ondansetron (ZOFRAN) injection 4 mg (has no administration in time range)  morphine (PF) 2 MG/ML injection 2 mg (has no administration in time range)                                                                                                                                     Procedures .Critical Care Performed by: Teressa Lower, MD Authorized by: Teressa Lower, MD   Critical care provider statement:     Critical care time (minutes):  30   Critical care was necessary to treat or prevent imminent or life-threatening deterioration of the following conditions:  Dehydration   Critical care was time spent personally by me on the following activities:  Development of treatment plan with patient or surrogate, discussions with consultants, evaluation of patient's response to treatment, examination of patient, ordering and review of laboratory studies, ordering and review of radiographic studies, ordering and performing treatments and interventions, pulse oximetry, re-evaluation of patient's condition and review of old charts  (including critical care time)  Medical Decision Making / ED Course   This patient presents to the ED for concern of fall, diarrhea, vomiting, this involves an extensive number of treatment options, and is a complaint that carries with it a high risk of complications and morbidity.  The differential diagnosis includes gastroenteritis, COVID-19, influenza, Legionella, orthostatic syncope, dehydration  MDM: Patient seen Emergency Department for evaluation of a fall on blood thinners as well as diarrhea and vomiting with associated syncopal episode.  Physical exam with an abrasion to the top of the head, tenderness along the CT and L-spine but is otherwise unremarkable.  Laboratory evaluation with an elevated creatinine at 2.21 from a baseline of 1.23, BUN 49, CO2 19, lactate elevated to 2.8, CBC with leukocytosis to 12.8.  Chest x-ray with possible nodule versus infectious process.  Pelvic x-ray unremarkable.  CT head, C-spine, T-spine, L-spine negative for acute traumatic injury.  CT T-spine does visualize the abnormal chest x-ray findings with concern for pneumonia.  Patient fluid resuscitated for suspected hypovolemia in the setting of his vomiting and diarrhea causing acute prerenal AKI and associated syncope.  Ceftriaxone azithromycin ordered for the patient's pneumonia and the  patient will require admission for resolution of his acute kidney injury and symptomatic  improvement of his dehydration.   Additional history obtained:  -External records from outside source obtained and reviewed including: Chart review including previous notes, labs, imaging, consultation notes   Lab Tests: -I ordered, reviewed, and interpreted labs.   The pertinent results include:   Labs Reviewed  CBG MONITORING, ED - Abnormal; Notable for the following components:      Result Value   Glucose-Capillary 134 (*)    All other components within normal limits  RESP PANEL BY RT-PCR (FLU A&B, COVID) ARPGX2  COMPREHENSIVE METABOLIC PANEL  CBC  ETHANOL  URINALYSIS, ROUTINE W REFLEX MICROSCOPIC  LACTIC ACID, PLASMA  PROTIME-INR  I-STAT CHEM 8, ED      EKG   EKG Interpretation  Date/Time:    Ventricular Rate:    PR Interval:    QRS Duration:   QT Interval:    QTC Calculation:   R Axis:     Text Interpretation:           Imaging Studies ordered: I ordered imaging studies including CXR, XR Pelvis, CTH, CT C spine, CT L spine, CT T spine I independently visualized and interpreted imaging. I agree with the radiologist interpretation   Medicines ordered and prescription drug management: Meds ordered this encounter  Medications   lactated ringers bolus 1,000 mL   ondansetron (ZOFRAN) injection 4 mg   morphine (PF) 2 MG/ML injection 2 mg    -I have reviewed the patients home medicines and have made adjustments as needed  Critical interventions Rehydration, trauma evaluation     Cardiac Monitoring: The patient was maintained on a cardiac monitor.  I personally viewed and interpreted the cardiac monitored which showed an underlying rhythm of: NSR  Social Determinants of Health:  Factors impacting patients care include: none   Reevaluation: After the interventions noted above, I reevaluated the patient and found that they have :improved  Co morbidities that  complicate the patient evaluation  Past Medical History:  Diagnosis Date   Anemia    pt denies this   Anxiety    Arthritis    "legs and feet" (06/10/2013)   Barrett's esophagus    "never dilated" (06/10/2013)   Cataract    Chronic kidney disease    stage 3   Colon polyps    tubular adenoma-last colon 12/18/2005   Depression    Diverticulosis    GERD (gastroesophageal reflux disease)    takes omeprazole   Hematoma    on mid back   Hiatal hernia    High cholesterol    Hypertension    Hypothyroidism    takes synthroid   Peripheral vascular disease ([place])       Dispostion: I considered admission for this patient, and due to prerenal AKI in the setting of associated dehydration and pneumonia, patient will be admitted.     Final Clinical Impression(s) / ED Diagnoses Final diagnoses:  None     @PCDICTATION @    Teressa Lower, MD 02/21/21 [phone]

## 2021-02-21 NOTE — Assessment & Plan Note (Signed)
Continue synthroid.

## 2021-02-21 NOTE — Progress Notes (Signed)
NEW ADMISSION NOTE New Admission Note:   Arrival Method: stretcher Mental Orientation: A &O X4 Telemetry: [id number] Assessment: Completed Skin: intact, scratches on BLE, bruising bilateral arms  IV: L wrist Pain: 0/10 Tubes: none  Safety Measures: Safety Fall Prevention Plan has been given, discussed and signed Admission: Completed [facility] Orientation: Patient has been orientated to the room, unit and staff.  Family: none at bedside  Orders have been reviewed and implemented. Will continue to monitor the patient. Call light has been placed within reach and bed alarm has been activated.   Alzena Gerber S Neenah Canter, RN

## 2021-02-21 NOTE — Progress Notes (Signed)
Orthopedic Tech Progress Note Patient Details:  Walter [name] [birth year] [id number] Level 2 Trauma  Patient ID: Walter [name], male   DOB: 01-18-1933, 88 y.o.   MRN: [id number]  Walter [name] 02/21/2021, 7:47 AM

## 2021-02-21 NOTE — H&P (Signed)
History and Physical    Patient: Walter [name] BJS:[id number] DOB: [birth year] DOA: 02/21/2021 DOS: the patient was seen and examined on 02/21/2021 PCP: Alroy Dust, L.Marlou Sa, MD  Patient coming from: Home - lives alone; NOK: Azzie Glatter, [phone]   Chief Complaint: Syncope  HPI: Walter [name] is a 88 y.o. male with medical history significant of stage 3 CKD; HTN; HLD; PAD; and hypothyroidism presenting with syncope. He started with n/v/d yesterday afternoon.  He was increasingly sick.  He laid down but symptoms continued.  He finally went to sleep and woke up terribly thirsty.  He started walking down the hall and things started moving and he fell, hitting his head against the wall.  He did lose consciousness, unsure how long he was down.  He was unable to get up, but reached some water.  He dragged himself to the couch and managed to get himself up onto the couch and again went to sleep for uncertain period of time.  He went to get more water with a walker.  He reached for a cup and doesn't remember anything until he woke up in the floor.  He phone was in another room and he eventually very slowly scooted himself to reach it.  Last vomiting/diarrhea was in the middle of the night.  He felt dizzy with standing overnight.  He doesn't think it was related to anything he ate.  Currently his neck is all that hurts.    ER Course:  Visits wife in SNF daily.  Last night with n/v/d (copious).  Today with syncope, likely orthostatic.  On Plavix.  Multifocal PNA on CXR and CT.  Creatinine 1.2 -> 2.2, lactate 2.8.     Review of Systems: As mentioned in the history of present illness. All other systems reviewed and are negative. Past Medical History:  Diagnosis Date   Anemia    pt denies this   Anxiety    Arthritis    "legs and feet" (06/10/2013)   Barrett's esophagus    "never dilated" (06/10/2013)   Cataract    Chronic kidney disease    stage 3   Colon polyps    tubular adenoma-last colon  12/18/2005   Depression    Diverticulosis    GERD (gastroesophageal reflux disease)    takes omeprazole   Hematoma    on mid back   Hiatal hernia    High cholesterol    Hypertension    Hypothyroidism    takes synthroid   Peripheral vascular disease ([place])    Past Surgical History:  Procedure Laterality Date   CARDIAC CATHETERIZATION     CATARACT EXTRACTION W/ INTRAOCULAR LENS  IMPLANT, BILATERAL Bilateral    COLONOSCOPY     EYE SURGERY     FEMORAL-POPLITEAL BYPASS GRAFT Left 11/27/2012   Procedure: BYPASS GRAFT LEFT ABOVE KNEE TO BELOW KNEE POPLITEAL ARTERY USING LEFT NONREVERSED GREATER SAPPHENOUS VEIN;  Surgeon: Serafina Mitchell, MD;  Location: [facility];  Service: Vascular;  Laterality: Left;   I & D EXTREMITY Left 06/10/2013   leg; "from the OR in 11/2012"   I & D EXTREMITY Left 06/10/2013   Procedure: IRRIGATION AND DEBRIDEMENT OF LEFT UPPER LEG;  Surgeon: Serafina Mitchell, MD;  Location: [facility];  Service: Vascular;  Laterality: Left;   LOWER EXTREMITY ANGIOGRAM N/A 10/01/2012   Procedure: LOWER EXTREMITY ANGIOGRAM;  Surgeon: Jettie Booze, MD;  Location: [facility] CATH LAB;  Service: Cardiovascular;  Laterality: N/A;   POLYPECTOMY     stomach  PYLOROPLASTY     TONSILLECTOMY     TRANSURETHRAL RESECTION OF PROSTATE     UPPER GASTROINTESTINAL ENDOSCOPY     Social History:  reports that he quit smoking about 35 years ago. His smoking use included cigarettes. He has a 20.00 pack-year smoking history. He has never used smokeless tobacco. He reports that he does not drink alcohol and does not use drugs.  Allergies  Allergen Reactions   Penicillins Other (See Comments)    Boils    Family History  Problem Relation Age of Onset   Heart disease Mother    Lung cancer Brother        and sister   Melanoma Father    Alzheimer's disease Sister        x 2   Heart disease Sister    Hypertension Sister    Colon cancer Neg Hx    Colon polyps Neg Hx    Rectal cancer Neg Hx    Pancreatic  cancer Neg Hx    Stomach cancer Neg Hx    Esophageal cancer Neg Hx     Prior to Admission medications   Medication Sig Start Date End Date Taking? Authorizing Provider  acetaminophen (TYLENOL) 500 MG tablet Take 1,000 mg by mouth at bedtime.    [provider]  alum & mag hydroxide-simeth (MAALOX/MYLANTA) 200-200-20 MG/5ML suspension Take 15 mLs by mouth daily.    [provider]  amLODipine (NORVASC) 5 MG tablet Take 5 mg by mouth at bedtime.     [provider]  clopidogrel (PLAVIX) 75 MG tablet Take 1 tablet (75 mg total) by mouth daily. 02/16/16   Jettie Booze, MD  levothyroxine (SYNTHROID, LEVOTHROID) 125 MCG tablet Take 100 mcg by mouth at bedtime.     [provider]  Omega 3-6-9 Fatty Acids (OMEGA 3-6-9 COMPLEX PO) Take 2 capsules by mouth daily.    [provider]  omeprazole (PRILOSEC) 40 MG capsule TAKE 1 CAPSULE BY MOUTH  DAILY 10/23/20   Irene Shipper, MD  Probiotic Product (PROBIOTIC PO) Take by mouth daily. Colon support OTC    [provider]  rosuvastatin (CRESTOR) 10 MG tablet Take 10 mg by mouth daily.    [provider]    Physical Exam: Vitals:   02/21/21 1057 02/21/21 1304 02/21/21 1400 02/21/21 1600  BP: 115/60 116/60 (!) 118/54 (!) 126/55  Pulse: 69 72 66 64  Resp: 14 15 20 15   Temp:      TempSrc:      SpO2: 97% 97% 94% 97%  Weight:      Height:       General:  Appears calm and comfortable and is in NAD Eyes:  PERRL, EOMI, normal lids, iris ENT:  grossly normal hearing, lips & tongue, mildly dry mm Neck:  no LAD, masses or thyromegaly Cardiovascular:  RRR, no m/r/g. No LE edema.  Respiratory:   CTA bilaterally with no wheezes/rales/rhonchi.  Normal respiratory effort. Abdomen:  soft, NT, ND Skin:  no rash or induration seen on limited exam Musculoskeletal:  grossly normal tone BUE/BLE, good ROM, no bony abnormality Psychiatric:  blunted mood and affect, speech fluent and appropriate,  AOx3 Neurologic:  CN 2-12 grossly intact, moves all extremities in coordinated fashion   Radiological Exams on Admission: Independently reviewed - see discussion in A/P where applicable  CT Head Wo Contrast  Result Date: 02/21/2021 CLINICAL DATA:  Head trauma, minor (Age >= 65y); Neck trauma (Age >= 65y) EXAM: CT HEAD  WITHOUT CONTRAST CT CERVICAL SPINE WITHOUT CONTRAST TECHNIQUE: Multidetector CT imaging of the head and cervical spine was performed following the standard protocol without intravenous contrast. Multiplanar CT image reconstructions of the cervical spine were also generated. RADIATION DOSE REDUCTION: This exam was performed according to the departmental dose-optimization program which includes automated exposure control, adjustment of the mA and/or kV according to patient size and/or use of iterative reconstruction technique. COMPARISON:  Head CT 09/16/2005 FINDINGS: CT HEAD FINDINGS Brain: No evidence of acute intracranial hemorrhage or extra-axial collection.No evidence of mass lesion/concern mass effect.The ventricles are normal in size.Confluent periventricular and subcortical white matter hypoattenuation, which is nonspecific but likely sequela of chronic small vessel ischemic disease. Vascular: No hyperdense vessel.  Scattered vascular calcifications. Skull: Normal. Negative for fracture or focal lesion. Sinuses/Orbits: Minimal paranasal sinus mucosal thickening. Other: None. CT CERVICAL SPINE FINDINGS Alignment: Trace anterolisthesis at C7-T1. Skull base and vertebrae: No acute fracture. No primary bone lesion or focal pathologic process. Soft tissues and spinal canal: No prevertebral fluid or swelling. No visible canal hematoma. Disc levels: There is multilevel degenerative disc disease, moderate to severe at C3-C4, C5-C6, and C6-C7. There is mild to moderate bilateral facet arthropathy. Multiple posterior disc osteophyte complexes resulting and likely varying degrees of mild to  moderate spinal canal or neural foraminal stenosis. Upper chest: Negative. Other: None. IMPRESSION: No acute intracranial abnormality. Sequelae of chronic small vessel ischemic disease. No acute cervical spine fracture. Multilevel degenerative disc disease and facet arthropathy. Electronically Signed   By: Maurine Simmering M.D.   On: 02/21/2021 08:33   CT Cervical Spine Wo Contrast  Result Date: 02/21/2021 CLINICAL DATA:  Head trauma, minor (Age >= 65y); Neck trauma (Age >= 65y) EXAM: CT HEAD WITHOUT CONTRAST CT CERVICAL SPINE WITHOUT CONTRAST TECHNIQUE: Multidetector CT imaging of the head and cervical spine was performed following the standard protocol without intravenous contrast. Multiplanar CT image reconstructions of the cervical spine were also generated. RADIATION DOSE REDUCTION: This exam was performed according to the departmental dose-optimization program which includes automated exposure control, adjustment of the mA and/or kV according to patient size and/or use of iterative reconstruction technique. COMPARISON:  Head CT 09/16/2005 FINDINGS: CT HEAD FINDINGS Brain: No evidence of acute intracranial hemorrhage or extra-axial collection.No evidence of mass lesion/concern mass effect.The ventricles are normal in size.Confluent periventricular and subcortical white matter hypoattenuation, which is nonspecific but likely sequela of chronic small vessel ischemic disease. Vascular: No hyperdense vessel.  Scattered vascular calcifications. Skull: Normal. Negative for fracture or focal lesion. Sinuses/Orbits: Minimal paranasal sinus mucosal thickening. Other: None. CT CERVICAL SPINE FINDINGS Alignment: Trace anterolisthesis at C7-T1. Skull base and vertebrae: No acute fracture. No primary bone lesion or focal pathologic process. Soft tissues and spinal canal: No prevertebral fluid or swelling. No visible canal hematoma. Disc levels: There is multilevel degenerative disc disease, moderate to severe at C3-C4,  C5-C6, and C6-C7. There is mild to moderate bilateral facet arthropathy. Multiple posterior disc osteophyte complexes resulting and likely varying degrees of mild to moderate spinal canal or neural foraminal stenosis. Upper chest: Negative. Other: None. IMPRESSION: No acute intracranial abnormality. Sequelae of chronic small vessel ischemic disease. No acute cervical spine fracture. Multilevel degenerative disc disease and facet arthropathy. Electronically Signed   By: Maurine Simmering M.D.   On: 02/21/2021 08:33   CT Thoracic Spine Wo Contrast  Addendum Date: 02/21/2021   ADDENDUM REPORT: 02/21/2021 09:26 ADDENDUM: There is multifocal airspace disease in the lower lungs, right middle lobe, and lingula consistent with  multifocal pneumonia. Opacities in the right middle lobe account for the rounded opacity noted on separately dictated chest radiograph. Recommend follow-up chest CT and 6-12 weeks to ensure resolution. These results were discussed with ED provider on 02/21/2021 at 9:10 am, who verbally acknowledged these results. Electronically Signed   By: Maurine Simmering M.D.   On: 02/21/2021 09:26   Result Date: 02/21/2021 CLINICAL DATA:  Spine fracture, thoracic, traumatic EXAM: CT THORACIC SPINE WITHOUT CONTRAST TECHNIQUE: Multidetector CT images of the thoracic were obtained using the standard protocol without intravenous contrast. RADIATION DOSE REDUCTION: This exam was performed according to the departmental dose-optimization program which includes automated exposure control, adjustment of the mA and/or kV according to patient size and/or use of iterative reconstruction technique. COMPARISON:  None. FINDINGS: Alignment: Normal Vertebrae: No acute fracture.  No aggressive osseous lesion. Paraspinal and other soft tissues: Bibasilar airspace opacities slightly obscured by respiratory motion artifact. Thoracic aortic atherosclerotic calcifications. Disc levels: Mild multilevel degenerative changes. No visible  impingement. IMPRESSION: No acute thoracic spine fracture. Mild multilevel degenerative changes. Bilateral lower lung airspace opacities, concerning for pneumonia. Electronically Signed: By: Maurine Simmering M.D. On: 02/21/2021 08:42   CT Lumbar Spine Wo Contrast  Result Date: 02/21/2021 CLINICAL DATA:  Trauma, lower back pain EXAM: CT LUMBAR SPINE WITHOUT CONTRAST TECHNIQUE: Multidetector CT imaging of the lumbar spine was performed without intravenous contrast administration. Multiplanar CT image reconstructions were also generated. RADIATION DOSE REDUCTION: This exam was performed according to the departmental dose-optimization program which includes automated exposure control, adjustment of the mA and/or kV according to patient size and/or use of iterative reconstruction technique. COMPARISON:  CT 05/27/2014 FINDINGS: Segmentation: 5 lumbar type vertebrae. Alignment: Mild retrolisthesis at L3-L4 and L4-L5. Vertebrae: There is no acute lumbar spine fracture. No aggressive osseous lesion. Paraspinal and other soft tissues: Aortoiliac atherosclerotic calcifications. No AAA. Sigmoid diverticulosis. There are bibasilar airspace opacities. Disc levels: There is mild multilevel degenerative disc disease with disc bulging and bilateral facet arthropathy resulting in varying degrees of spinal canal neural foraminal narrowing. Moderate-severe left-sided neural foraminal stenosis at L4-L5 due to bony spurring and disc bulging. IMPRESSION: No acute lumbar spine fracture. Multilevel degenerative disc disease and facet arthropathy with probable moderate-severe left-sided neural foraminal stenosis at L4-L5. Bibasilar airspace opacities could be developing infection. Electronically Signed   By: Maurine Simmering M.D.   On: 02/21/2021 08:38   DG Pelvis Portable  Result Date: 02/21/2021 CLINICAL DATA:  Trauma EXAM: PORTABLE PELVIS 1-2 VIEWS COMPARISON:  CT 05/27/2014 FINDINGS: There is no radiographically evident pelvic fracture or  femoral neck fracture on single frontal view of the pelvis. There is moderate right and mild left hip osteoarthritis. Lower lumbar spine degenerative changes. IMPRESSION: No evidence of acute fracture on single frontal view of the pelvis. Electronically Signed   By: Maurine Simmering M.D.   On: 02/21/2021 08:00   DG Chest Portable 1 View  Result Date: 02/21/2021 CLINICAL DATA:  Trauma, fall EXAM: PORTABLE CHEST 1 VIEW COMPARISON:  Radiograph 11/24/2012 FINDINGS: The cardiomediastinal silhouette is within normal limits. There is a rounded right lateral mid lung opacity. Faint left basilar opacities. No large pleural effusion. No visible pneumothorax. No acute fracture on single frontal view of the chest. IMPRESSION: Rounded right lateral mid lung opacity, could represent contusion, focal infection, or potentially a pulmonary nodule given rounded morphology. Faint left basilar opacities which could be atelectasis or infection. Recommend chest CT. Electronically Signed   By: Maurine Simmering M.D.   On: 02/21/2021 07:59  EKG: Independently reviewed.  NSR with rate 84; no evidence of acute ischemia   Labs on Admission: I have personally reviewed the available labs and imaging studies at the time of the admission.  Pertinent labs:    CO2 19 Glucose 151 BUN 49/Creatinine 2.21/GFR 28 Albumin 3.2 Lactate 2.8 WBC 12.8 ETOH <10    Assessment and Plan: * Syncope and collapse- (present on admission) -Patient with acute onset of n/v/d overnight followed by syncopal episodes -He was found to have AKI, very likely hypovolemic syncope -He is still having some room spinning and so there may be a vertigo component; will give meclizine -Will observe overnight on telemetry in the hospital. -Orthostatic vital signs now and in AM  Dyslipidemia- (present on admission) -Continue Crestor  DNR (do not resuscitate)- (present on admission) -I have discussed code status with the patient and he would not desire  resuscitation and would prefer to die a natural death should that situation arise. -He will need a gold out of facility DNR form at the time of discharge  Abnormal chest x-ray- (present on admission) -CXR with possible infiltrates -CT thoracic with multilobal PNA -The patient does not currently complain of respiratory symptoms, is on RA with minimal leukocytosis -He was given Rocephin and Azithromycin in the ER -Will hold further antibiotics given no current clinical correlation  Nausea vomiting and diarrhea- (present on admission) -Acute onset of symptoms, appear to have resolved -Likely viral gastroenteritis -If symptoms recur, consider stool studies -Resume probiotics  Acute kidney injury ([place])- (present on admission) -Uncertain baseline creatinine - none in Epic since 2016 -Likely pre-renal azotemia to some extent -Will hydrate and follow -Avoid nephrotoxic medications  Essential hypertension- (present on admission) -Continue Norvasc -Will add prn IV hydralazine  Hypothyroidism- (present on admission) -Continue synthroid     Advance Care Planning:   Code Status: DNR   Consults: None  DVT Prophylaxis: Lovenox  Family Communication: None present; he did not request that I contact family at the time of my evaluation  Severity of Illness: The appropriate patient status for this patient is OBSERVATION. Observation status is judged to be reasonable and necessary in order to provide the required intensity of service to ensure the patient's safety. The patient's presenting symptoms, physical exam findings, and initial radiographic and laboratory data in the context of their medical condition is felt to place them at decreased risk for further clinical deterioration. Furthermore, it is anticipated that the patient will be medically stable for discharge from the hospital within 2 midnights of admission.   Author: Karmen Bongo, MD 02/21/2021 4:17 PM  For on call review  [email or web address].

## 2021-02-21 NOTE — ED Triage Notes (Signed)
Pt here via EMS from home d/t syncopal episode with fall on plavix. Pt had N/V and diarrhea 02/20/21. Got up today to get water and woke up on the floor. Abrasion noted back of head. Neck and upper back pain endorsed. Alert and oriented X4.   20g LFA 97.4 T 114/62 90 HR  18 RR 97% RA CBG 175 300NS given by EMS

## 2021-02-21 NOTE — Assessment & Plan Note (Signed)
-  Continue Norvasc -Will add prn IV hydralazine

## 2021-02-21 NOTE — Progress Notes (Signed)
°   02/21/21 0733  Clinical Encounter Type  Visited With Health care provider;Patient not available  Visit Type Initial  Referral From Nurse  Consult/Referral To Chaplain   Level 2 Trauma in E.D. for 88 y.o. male. Phoned [place] and told Chaplain not needed at this time and no family present. Patient not seen.They will page if needed. [place], M.Min. [phone].

## 2021-02-21 NOTE — ED Notes (Signed)
[place] C-Collar placed

## 2021-02-21 NOTE — ED Notes (Signed)
Patient transported to CT 

## 2021-02-21 NOTE — Assessment & Plan Note (Signed)
Continue Crestor 

## 2021-02-22 DIAGNOSIS — I951 Orthostatic hypotension: Secondary | ICD-10-CM | POA: Diagnosis present

## 2021-02-22 DIAGNOSIS — N183 Chronic kidney disease, stage 3 unspecified: Secondary | ICD-10-CM | POA: Diagnosis present

## 2021-02-22 DIAGNOSIS — W2201XA Walked into wall, initial encounter: Secondary | ICD-10-CM | POA: Diagnosis present

## 2021-02-22 DIAGNOSIS — Z66 Do not resuscitate: Secondary | ICD-10-CM | POA: Diagnosis present

## 2021-02-22 DIAGNOSIS — S0001XA Abrasion of scalp, initial encounter: Secondary | ICD-10-CM | POA: Diagnosis present

## 2021-02-22 DIAGNOSIS — R55 Syncope and collapse: Secondary | ICD-10-CM | POA: Diagnosis present

## 2021-02-22 DIAGNOSIS — Z79899 Other long term (current) drug therapy: Secondary | ICD-10-CM | POA: Diagnosis not present

## 2021-02-22 DIAGNOSIS — E039 Hypothyroidism, unspecified: Secondary | ICD-10-CM | POA: Diagnosis present

## 2021-02-22 DIAGNOSIS — Z79890 Hormone replacement therapy (postmenopausal): Secondary | ICD-10-CM | POA: Diagnosis not present

## 2021-02-22 DIAGNOSIS — Z820 Family history of epilepsy and other diseases of the nervous system: Secondary | ICD-10-CM | POA: Diagnosis not present

## 2021-02-22 DIAGNOSIS — Z880 Allergy status to penicillin: Secondary | ICD-10-CM | POA: Diagnosis not present

## 2021-02-22 DIAGNOSIS — I129 Hypertensive chronic kidney disease with stage 1 through stage 4 chronic kidney disease, or unspecified chronic kidney disease: Secondary | ICD-10-CM | POA: Diagnosis present

## 2021-02-22 DIAGNOSIS — Y92009 Unspecified place in unspecified non-institutional (private) residence as the place of occurrence of the external cause: Secondary | ICD-10-CM | POA: Diagnosis not present

## 2021-02-22 DIAGNOSIS — Z961 Presence of intraocular lens: Secondary | ICD-10-CM | POA: Diagnosis present

## 2021-02-22 DIAGNOSIS — K219 Gastro-esophageal reflux disease without esophagitis: Secondary | ICD-10-CM | POA: Diagnosis present

## 2021-02-22 DIAGNOSIS — E860 Dehydration: Secondary | ICD-10-CM | POA: Diagnosis present

## 2021-02-22 DIAGNOSIS — J189 Pneumonia, unspecified organism: Secondary | ICD-10-CM | POA: Diagnosis present

## 2021-02-22 DIAGNOSIS — E861 Hypovolemia: Secondary | ICD-10-CM | POA: Diagnosis present

## 2021-02-22 DIAGNOSIS — A0811 Acute gastroenteropathy due to Norwalk agent: Secondary | ICD-10-CM | POA: Diagnosis present

## 2021-02-22 DIAGNOSIS — N179 Acute kidney failure, unspecified: Secondary | ICD-10-CM | POA: Diagnosis present

## 2021-02-22 DIAGNOSIS — Z87891 Personal history of nicotine dependence: Secondary | ICD-10-CM | POA: Diagnosis not present

## 2021-02-22 DIAGNOSIS — Z8249 Family history of ischemic heart disease and other diseases of the circulatory system: Secondary | ICD-10-CM | POA: Diagnosis not present

## 2021-02-22 DIAGNOSIS — E7800 Pure hypercholesterolemia, unspecified: Secondary | ICD-10-CM | POA: Diagnosis present

## 2021-02-22 DIAGNOSIS — E785 Hyperlipidemia, unspecified: Secondary | ICD-10-CM | POA: Diagnosis present

## 2021-02-22 DIAGNOSIS — I739 Peripheral vascular disease, unspecified: Secondary | ICD-10-CM | POA: Diagnosis present

## 2021-02-22 DIAGNOSIS — Z7901 Long term (current) use of anticoagulants: Secondary | ICD-10-CM | POA: Diagnosis not present

## 2021-02-22 DIAGNOSIS — Z20822 Contact with and (suspected) exposure to covid-19: Secondary | ICD-10-CM | POA: Diagnosis present

## 2021-02-22 LAB — BASIC METABOLIC PANEL
Anion gap: 6 (ref 5–15)
BUN: 45 mg/dL — ABNORMAL HIGH (ref 8–23)
CO2: 23 mmol/L (ref 22–32)
Calcium: 8.2 mg/dL — ABNORMAL LOW (ref 8.9–10.3)
Chloride: 107 mmol/L (ref 98–111)
Creatinine, Ser: 1.52 mg/dL — ABNORMAL HIGH (ref 0.61–1.24)
GFR, Estimated: 45 mL/min — ABNORMAL LOW (ref >60)
Glucose, Bld: 119 mg/dL — ABNORMAL HIGH (ref 70–99)
Potassium: 4.1 mmol/L (ref 3.5–5.1)
Sodium: 136 mmol/L (ref 135–145)

## 2021-02-22 LAB — CBC
HCT: 33 % — ABNORMAL LOW (ref 39.0–52.0)
Hemoglobin: 11.4 g/dL — ABNORMAL LOW (ref 13.0–17.0)
MCH: 32.9 pg (ref 26.0–34.0)
MCHC: 34.5 g/dL (ref 30.0–36.0)
MCV: 95.4 fL (ref 80.0–100.0)
Platelets: 138 10*3/uL — ABNORMAL LOW (ref 150–400)
RBC: 3.46 MIL/uL — ABNORMAL LOW (ref 4.22–5.81)
RDW: 13.9 % (ref 11.5–15.5)
WBC: 10.1 10*3/uL (ref 4.0–10.5)
nRBC: 0 % (ref 0.0–0.2)

## 2021-02-22 MED ORDER — SODIUM CHLORIDE 0.9 % IV SOLN
1.0000 g | INTRAVENOUS | Status: AC
  Administered 2021-02-22 – 2021-02-25 (×4): 1 g via INTRAVENOUS
  Filled 2021-02-22 (×4): qty 10

## 2021-02-22 MED ORDER — MECLIZINE HCL 25 MG PO TABS
25.0000 mg | ORAL_TABLET | Freq: Three times a day (TID) | ORAL | Status: DC
Start: 2021-02-22 — End: 2021-02-24
  Administered 2021-02-22 – 2021-02-24 (×6): 25 mg via ORAL
  Filled 2021-02-22 (×6): qty 1

## 2021-02-22 MED ORDER — SODIUM CHLORIDE 0.9 % IV SOLN
500.0000 mg | Freq: Every day | INTRAVENOUS | Status: DC
  Administered 2021-02-22 – 2021-02-24 (×3): 500 mg via INTRAVENOUS
  Filled 2021-02-22 (×4): qty 5

## 2021-02-22 MED ORDER — CALCIUM CARBONATE ANTACID 500 MG PO CHEW
1.0000 | CHEWABLE_TABLET | Freq: Once | ORAL | Status: AC
  Administered 2021-02-22: 200 mg via ORAL
  Filled 2021-02-22: qty 1

## 2021-02-22 NOTE — Progress Notes (Signed)
PROGRESS NOTE    Walter [name]  TZG:[id number] DOB: 1933/07/21 DOA: 02/21/2021 PCP: Alroy Dust, L.Marlou Sa, MD    Brief Narrative:  Walter [name] is a 88 y.o. male with medical history significant of stage 3 CKD; HTN; HLD; PAD; and hypothyroidism presenting with syncope. He started with n/v/d yesterday afternoon.  He was increasingly sick.  He laid down but symptoms continued.  He finally went to sleep and woke up terribly thirsty.  He started walking down the hall and things started moving and he fell, hitting his head against the wall.  He did lose consciousness, unsure how long he was down.  He was unable to get up, but reached some water.  He dragged himself to the couch and managed to get himself up onto the couch and again went to sleep for uncertain period of time.  He went to get more water with a walker.  He reached for a cup and doesn't remember anything until he woke up in the floor.  He phone was in another room and he eventually very slowly scooted himself to reach it.  Last vomiting/diarrhea was in the middle of the night.  He felt dizzy with standing overnight.  He doesn't think it was related to anything he ate.  Currently his neck is all that hurts.  2/16 with 2 episodes of diarrhea today  Consultants:    Procedures:   Antimicrobials:      Subjective: No shortness of breath, chest pain, or abdominal pain.  Does get dizzy with head movement still.  Objective: Vitals:   02/21/21 1844 02/21/21 2130 02/22/21 0456 02/22/21 0850  BP: (!) 138/57 (!) 120/56 (!) 120/49 (!) 117/48  Pulse: 67 64 60 (!) 58  Resp: 18 17 19 16   Temp: 98.3 F (36.8 C) 98.5 F (36.9 C) 98 F (36.7 C) 98 F (36.7 C)  TempSrc: Oral Oral Oral Oral  SpO2: 98% 94% 93% 95%  Weight: 84 kg     Height: 5\' 8"  (1.727 m)       Intake/Output Summary (Last 24 hours) at 02/22/2021 1534 Last data filed at 02/22/2021 1100 Gross per 24 hour  Intake 2333.66 ml  Output --  Net 2333.66 ml   Filed Weights    02/21/21 0826 02/21/21 1844  Weight: 81.6 kg 84 kg    Examination:  Calm, NAD Cta no w/r Reg s1/s2 no gallop Soft benign +bs No edema Aaoxox3  Mood and affect appropriate in current setting     Data Reviewed: I have personally reviewed following labs and imaging studies  CBC: Recent Labs  Lab 02/21/21 0811 02/21/21 0820 02/22/21 0224  WBC 12.8*  --  10.1  HGB 14.8 15.3 11.4*  HCT 43.3 45.0 33.0*  MCV 95.8  --  95.4  PLT 174  --  759*   Basic Metabolic Panel: Recent Labs  Lab 02/21/21 0811 02/21/21 0820 02/22/21 0224  NA 137 136 136  K 4.5 4.8 4.1  CL 106 110 107  CO2 19*  --  23  GLUCOSE 151* 149* 119*  BUN 49* 59* 45*  CREATININE 2.21* 2.00* 1.52*  CALCIUM 8.7*  --  8.2*   GFR: Estimated Creatinine Clearance: 37.5 mL/min (A) (by C-G formula based on SCr of 1.52 mg/dL (H)). Liver Function Tests: Recent Labs  Lab 02/21/21 0811  AST 27  ALT 16  ALKPHOS 52  BILITOT 0.8  PROT 5.9*  ALBUMIN 3.2*   No results for input(s): LIPASE, AMYLASE in the last 168 hours.  No results for input(s): AMMONIA in the last 168 hours. Coagulation Profile: Recent Labs  Lab 02/21/21 0811  INR 1.2   Cardiac Enzymes: No results for input(s): CKTOTAL, CKMB, CKMBINDEX, TROPONINI in the last 168 hours. BNP (last 3 results) No results for input(s): PROBNP in the last 8760 hours. HbA1C: No results for input(s): HGBA1C in the last 72 hours. CBG: Recent Labs  Lab 02/21/21 0751  GLUCAP 134*   Lipid Profile: No results for input(s): CHOL, HDL, LDLCALC, TRIG, CHOLHDL, LDLDIRECT in the last 72 hours. Thyroid Function Tests: No results for input(s): TSH, T4TOTAL, FREET4, T3FREE, THYROIDAB in the last 72 hours. Anemia Panel: No results for input(s): VITAMINB12, FOLATE, FERRITIN, TIBC, IRON, RETICCTPCT in the last 72 hours. Sepsis Labs: Recent Labs  Lab 02/21/21 0811  LATICACIDVEN 2.8*    Recent Results (from the past 240 hour(s))  Resp Panel by RT-PCR (Flu A&B,  Covid) Nasopharyngeal Swab     Status: None   Collection Time: 02/21/21  8:25 AM   Specimen: Nasopharyngeal Swab; Nasopharyngeal(NP) swabs in vial transport medium  Result Value Ref Range Status   SARS Coronavirus 2 by RT PCR NEGATIVE NEGATIVE Final    Comment: (NOTE) SARS-CoV-2 target nucleic acids are NOT DETECTED.  The SARS-CoV-2 RNA is generally detectable in upper respiratory specimens during the acute phase of infection. The lowest concentration of SARS-CoV-2 viral copies this assay can detect is 138 copies/mL. A negative result does not preclude SARS-Cov-2 infection and should not be used as the sole basis for treatment or other patient management decisions. A negative result may occur with  improper specimen collection/handling, submission of specimen other than nasopharyngeal swab, presence of viral mutation(s) within the areas targeted by this assay, and inadequate number of viral copies(<138 copies/mL). A negative result must be combined with clinical observations, patient history, and epidemiological information. The expected result is Negative.  Fact Sheet for Patients:  EntrepreneurPulse.com.au  Fact Sheet for Healthcare Providers:  IncredibleEmployment.be  This test is no t yet approved or cleared by the [place] FDA and  has been authorized for detection and/or diagnosis of SARS-CoV-2 by FDA under an Emergency Use Authorization (EUA). This EUA will remain  in effect (meaning this test can be used) for the duration of the COVID-19 declaration under Section 564(b)(1) of the Act, 21 U.S.C.section 360bbb-3(b)(1), unless the authorization is terminated  or revoked sooner.       Influenza A by PCR NEGATIVE NEGATIVE Final   Influenza B by PCR NEGATIVE NEGATIVE Final    Comment: (NOTE) The Xpert Xpress SARS-CoV-2/FLU/RSV plus assay is intended as an aid in the diagnosis of influenza from Nasopharyngeal swab specimens and should  not be used as a sole basis for treatment. Nasal washings and aspirates are unacceptable for Xpert Xpress SARS-CoV-2/FLU/RSV testing.  Fact Sheet for Patients: EntrepreneurPulse.com.au  Fact Sheet for Healthcare Providers: IncredibleEmployment.be  This test is not yet approved or cleared by the [place] FDA and has been authorized for detection and/or diagnosis of SARS-CoV-2 by FDA under an Emergency Use Authorization (EUA). This EUA will remain in effect (meaning this test can be used) for the duration of the COVID-19 declaration under Section 564(b)(1) of the Act, 21 U.S.C. section 360bbb-3(b)(1), unless the authorization is terminated or revoked.  Performed at [facility], [place] [address] [place], [place] [address]          Radiology Studies: CT Head Wo Contrast  Result Date: 02/21/2021 CLINICAL DATA:  Head trauma, minor (Age >=  65y); Neck trauma (Age >= 65y) EXAM: CT HEAD WITHOUT CONTRAST CT CERVICAL SPINE WITHOUT CONTRAST TECHNIQUE: Multidetector CT imaging of the head and cervical spine was performed following the standard protocol without intravenous contrast. Multiplanar CT image reconstructions of the cervical spine were also generated. RADIATION DOSE REDUCTION: This exam was performed according to the departmental dose-optimization program which includes automated exposure control, adjustment of the mA and/or kV according to patient size and/or use of iterative reconstruction technique. COMPARISON:  Head CT 09/16/2005 FINDINGS: CT HEAD FINDINGS Brain: No evidence of acute intracranial hemorrhage or extra-axial collection.No evidence of mass lesion/concern mass effect.The ventricles are normal in size.Confluent periventricular and subcortical white matter hypoattenuation, which is nonspecific but likely sequela of chronic small vessel ischemic disease. Vascular: No hyperdense vessel.  Scattered vascular calcifications. Skull:  Normal. Negative for fracture or focal lesion. Sinuses/Orbits: Minimal paranasal sinus mucosal thickening. Other: None. CT CERVICAL SPINE FINDINGS Alignment: Trace anterolisthesis at C7-T1. Skull base and vertebrae: No acute fracture. No primary bone lesion or focal pathologic process. Soft tissues and spinal canal: No prevertebral fluid or swelling. No visible canal hematoma. Disc levels: There is multilevel degenerative disc disease, moderate to severe at C3-C4, C5-C6, and C6-C7. There is mild to moderate bilateral facet arthropathy. Multiple posterior disc osteophyte complexes resulting and likely varying degrees of mild to moderate spinal canal or neural foraminal stenosis. Upper chest: Negative. Other: None. IMPRESSION: No acute intracranial abnormality. Sequelae of chronic small vessel ischemic disease. No acute cervical spine fracture. Multilevel degenerative disc disease and facet arthropathy. Electronically Signed   By: Maurine Simmering M.D.   On: 02/21/2021 08:33   CT Cervical Spine Wo Contrast  Result Date: 02/21/2021 CLINICAL DATA:  Head trauma, minor (Age >= 65y); Neck trauma (Age >= 65y) EXAM: CT HEAD WITHOUT CONTRAST CT CERVICAL SPINE WITHOUT CONTRAST TECHNIQUE: Multidetector CT imaging of the head and cervical spine was performed following the standard protocol without intravenous contrast. Multiplanar CT image reconstructions of the cervical spine were also generated. RADIATION DOSE REDUCTION: This exam was performed according to the departmental dose-optimization program which includes automated exposure control, adjustment of the mA and/or kV according to patient size and/or use of iterative reconstruction technique. COMPARISON:  Head CT 09/16/2005 FINDINGS: CT HEAD FINDINGS Brain: No evidence of acute intracranial hemorrhage or extra-axial collection.No evidence of mass lesion/concern mass effect.The ventricles are normal in size.Confluent periventricular and subcortical white matter  hypoattenuation, which is nonspecific but likely sequela of chronic small vessel ischemic disease. Vascular: No hyperdense vessel.  Scattered vascular calcifications. Skull: Normal. Negative for fracture or focal lesion. Sinuses/Orbits: Minimal paranasal sinus mucosal thickening. Other: None. CT CERVICAL SPINE FINDINGS Alignment: Trace anterolisthesis at C7-T1. Skull base and vertebrae: No acute fracture. No primary bone lesion or focal pathologic process. Soft tissues and spinal canal: No prevertebral fluid or swelling. No visible canal hematoma. Disc levels: There is multilevel degenerative disc disease, moderate to severe at C3-C4, C5-C6, and C6-C7. There is mild to moderate bilateral facet arthropathy. Multiple posterior disc osteophyte complexes resulting and likely varying degrees of mild to moderate spinal canal or neural foraminal stenosis. Upper chest: Negative. Other: None. IMPRESSION: No acute intracranial abnormality. Sequelae of chronic small vessel ischemic disease. No acute cervical spine fracture. Multilevel degenerative disc disease and facet arthropathy. Electronically Signed   By: Maurine Simmering M.D.   On: 02/21/2021 08:33   CT Thoracic Spine Wo Contrast  Addendum Date: 02/21/2021   ADDENDUM REPORT: 02/21/2021 09:26 ADDENDUM: There is multifocal airspace disease in the  lower lungs, right middle lobe, and lingula consistent with multifocal pneumonia. Opacities in the right middle lobe account for the rounded opacity noted on separately dictated chest radiograph. Recommend follow-up chest CT and 6-12 weeks to ensure resolution. These results were discussed with ED provider on 02/21/2021 at 9:10 am, who verbally acknowledged these results. Electronically Signed   By: Maurine Simmering M.D.   On: 02/21/2021 09:26   Result Date: 02/21/2021 CLINICAL DATA:  Spine fracture, thoracic, traumatic EXAM: CT THORACIC SPINE WITHOUT CONTRAST TECHNIQUE: Multidetector CT images of the thoracic were obtained using the  standard protocol without intravenous contrast. RADIATION DOSE REDUCTION: This exam was performed according to the departmental dose-optimization program which includes automated exposure control, adjustment of the mA and/or kV according to patient size and/or use of iterative reconstruction technique. COMPARISON:  None. FINDINGS: Alignment: Normal Vertebrae: No acute fracture.  No aggressive osseous lesion. Paraspinal and other soft tissues: Bibasilar airspace opacities slightly obscured by respiratory motion artifact. Thoracic aortic atherosclerotic calcifications. Disc levels: Mild multilevel degenerative changes. No visible impingement. IMPRESSION: No acute thoracic spine fracture. Mild multilevel degenerative changes. Bilateral lower lung airspace opacities, concerning for pneumonia. Electronically Signed: By: Maurine Simmering M.D. On: 02/21/2021 08:42   CT Lumbar Spine Wo Contrast  Result Date: 02/21/2021 CLINICAL DATA:  Trauma, lower back pain EXAM: CT LUMBAR SPINE WITHOUT CONTRAST TECHNIQUE: Multidetector CT imaging of the lumbar spine was performed without intravenous contrast administration. Multiplanar CT image reconstructions were also generated. RADIATION DOSE REDUCTION: This exam was performed according to the departmental dose-optimization program which includes automated exposure control, adjustment of the mA and/or kV according to patient size and/or use of iterative reconstruction technique. COMPARISON:  CT 05/27/2014 FINDINGS: Segmentation: 5 lumbar type vertebrae. Alignment: Mild retrolisthesis at L3-L4 and L4-L5. Vertebrae: There is no acute lumbar spine fracture. No aggressive osseous lesion. Paraspinal and other soft tissues: Aortoiliac atherosclerotic calcifications. No AAA. Sigmoid diverticulosis. There are bibasilar airspace opacities. Disc levels: There is mild multilevel degenerative disc disease with disc bulging and bilateral facet arthropathy resulting in varying degrees of spinal canal  neural foraminal narrowing. Moderate-severe left-sided neural foraminal stenosis at L4-L5 due to bony spurring and disc bulging. IMPRESSION: No acute lumbar spine fracture. Multilevel degenerative disc disease and facet arthropathy with probable moderate-severe left-sided neural foraminal stenosis at L4-L5. Bibasilar airspace opacities could be developing infection. Electronically Signed   By: Maurine Simmering M.D.   On: 02/21/2021 08:38   DG Pelvis Portable  Result Date: 02/21/2021 CLINICAL DATA:  Trauma EXAM: PORTABLE PELVIS 1-2 VIEWS COMPARISON:  CT 05/27/2014 FINDINGS: There is no radiographically evident pelvic fracture or femoral neck fracture on single frontal view of the pelvis. There is moderate right and mild left hip osteoarthritis. Lower lumbar spine degenerative changes. IMPRESSION: No evidence of acute fracture on single frontal view of the pelvis. Electronically Signed   By: Maurine Simmering M.D.   On: 02/21/2021 08:00   DG Chest Portable 1 View  Result Date: 02/21/2021 CLINICAL DATA:  Trauma, fall EXAM: PORTABLE CHEST 1 VIEW COMPARISON:  Radiograph 11/24/2012 FINDINGS: The cardiomediastinal silhouette is within normal limits. There is a rounded right lateral mid lung opacity. Faint left basilar opacities. No large pleural effusion. No visible pneumothorax. No acute fracture on single frontal view of the chest. IMPRESSION: Rounded right lateral mid lung opacity, could represent contusion, focal infection, or potentially a pulmonary nodule given rounded morphology. Faint left basilar opacities which could be atelectasis or infection. Recommend chest CT. Electronically Signed   By:  Maurine Simmering M.D.   On: 02/21/2021 07:59        Scheduled Meds:  acetaminophen  1,000 mg Oral QHS   amLODipine  5 mg Oral QHS   clopidogrel  75 mg Oral Daily   enoxaparin (LOVENOX) injection  30 mg Subcutaneous Q24H   levothyroxine  125 mcg Oral Q0600   melatonin  3 mg Oral QHS   pantoprazole  40 mg Oral Daily    rosuvastatin  10 mg Oral Daily   saccharomyces boulardii  250 mg Oral Daily   sodium chloride flush  3 mL Intravenous Q12H   Continuous Infusions:  azithromycin (ZITHROMAX) 500 MG IVPB (Vial-Mate [employer]) 500 mg (02/22/21 1040)   cefTRIAXone (ROCEPHIN)  IV 1 g (02/22/21 0910)   lactated ringers 75 mL/hr at 02/22/21 0228    Assessment & Plan:   Principal Problem:   Syncope and collapse Active Problems:   Hypothyroidism   Essential hypertension   Acute kidney injury (HCC)   Nausea vomiting and diarrhea   Abnormal chest x-ray   DNR (do not resuscitate)   Dyslipidemia   Syncope and collapse- (present on admission) Due to hypovolemia from diarrhea Continue IV fluids Stool panel pending   Vertigo Will change meclizine from prn to standing dose 25mg  tid  PNA On ct scan Wbc was up, given iv abx in Er, now wbc down Resume iv ceftx. And azith.   Dyslipidemia- (present on admission) -Continue Crestor        Acute gastroenteritis Likely viral Gi panel sent today Supportive care Continue probiotics     Acute kidney injury ([place])- (present on admission) Improving with IV hydration  2.21>>1.52 Continue IV fluids Avoid nephrotoxic meds   Essential hypertension- (present on admission) Due to hypovolemia and bp nml, will dc amlodipine for now   Hypothyroidism- (present on admission) -Continue synthroid       DVT prophylaxis: Lovenox Code Status: DNR Family Communication: None at bedside Disposition Plan:  Status is: Observation The patient will require care spanning > 2 midnights and should be moved to inpatient because: IV treatment              LOS: 0 days   Time spent: 50 minutes     Nolberto Hanlon, MD [facility] Pager [phone]  If 7PM-7AM, please contact night-coverage 02/22/2021, 3:34 PM

## 2021-02-22 NOTE — Progress Notes (Signed)
°  Transition of Care [facility]) Screening Note   Patient Details  Name: Walter [name] Date of Birth: [birth year]   Transition of Care [facility]) CM/SW Contact:    Tom-Johnson, Renea Ee, RN Phone Number: 02/22/2021, 3:29 PM  Patient is from home alone. Has two sons, one in [place] and the other lives in [place] but travels a lot. Wife is currently staying at a Long term SNF. Admitted for Syncope. Found to be hypotensive. Independent with care and drive self prior to admission. Has a cane, walker and walk in tub at home. PCP is Alroy Dust, L.Marlou Sa, MD and uses [facility] and [facility] for mail in order. States hi neighbor will transport at discharge. Transition of Care Department [facility]) has reviewed patient and no TOC needs or recommendations has been identified at this time. TOC will continue to monitor patient advancement through interdisciplinary progression rounds. If new patient transition needs arise, please place a TOC consult.

## 2021-02-23 LAB — GASTROINTESTINAL PANEL BY PCR, STOOL (REPLACES STOOL CULTURE)

## 2021-02-23 LAB — BASIC METABOLIC PANEL
Anion gap: 6 (ref 5–15)
BUN: 30 mg/dL — ABNORMAL HIGH (ref 8–23)
CO2: 23 mmol/L (ref 22–32)
Calcium: 8 mg/dL — ABNORMAL LOW (ref 8.9–10.3)
Chloride: 109 mmol/L (ref 98–111)
Creatinine, Ser: 1.21 mg/dL (ref 0.61–1.24)
GFR, Estimated: 59 mL/min — ABNORMAL LOW (ref >60)
Glucose, Bld: 106 mg/dL — ABNORMAL HIGH (ref 70–99)
Potassium: 4 mmol/L (ref 3.5–5.1)
Sodium: 138 mmol/L (ref 135–145)

## 2021-02-23 LAB — HEMOGLOBIN AND HEMATOCRIT, BLOOD
HCT: 33 % — ABNORMAL LOW (ref 39.0–52.0)
Hemoglobin: 11.3 g/dL — ABNORMAL LOW (ref 13.0–17.0)

## 2021-02-23 MED ORDER — BACID PO TABS
2.0000 | ORAL_TABLET | Freq: Three times a day (TID) | ORAL | Status: DC
  Filled 2021-02-23 (×2): qty 2

## 2021-02-23 MED ORDER — ENOXAPARIN SODIUM 40 MG/0.4ML IJ SOSY
40.0000 mg | PREFILLED_SYRINGE | INTRAMUSCULAR | Status: DC
  Administered 2021-02-23 – 2021-02-27 (×5): 40 mg via SUBCUTANEOUS
  Filled 2021-02-23 (×5): qty 0.4

## 2021-02-23 MED ORDER — RISAQUAD PO CAPS
1.0000 | ORAL_CAPSULE | Freq: Three times a day (TID) | ORAL | Status: DC
  Administered 2021-02-23 – 2021-02-28 (×16): 1 via ORAL
  Filled 2021-02-23 (×16): qty 1

## 2021-02-23 MED ORDER — CALCIUM CARBONATE ANTACID 500 MG PO CHEW
1.0000 | CHEWABLE_TABLET | Freq: Every day | ORAL | Status: DC
  Administered 2021-02-23 – 2021-02-28 (×6): 200 mg via ORAL
  Filled 2021-02-23 (×6): qty 1

## 2021-02-23 NOTE — Progress Notes (Signed)
PROGRESS NOTE    Walter [name]  [id number]:[id number] DOB: August 19, 1933 DOA: 02/21/2021 PCP: Alroy Dust, L.Marlou Sa, MD    Brief Narrative:  Walter [name] is a 88 y.o. male with medical history significant of stage 3 CKD; HTN; HLD; PAD; and hypothyroidism presenting with syncope. He started with n/v/d yesterday afternoon.  He was increasingly sick.  He laid down but symptoms continued.  He finally went to sleep and woke up terribly thirsty.  He started walking down the hall and things started moving and he fell, hitting his head against the wall.  He did lose consciousness, unsure how long he was down.  He was unable to get up, but reached some water.  He dragged himself to the couch and managed to get himself up onto the couch and again went to sleep for uncertain period of time.  He went to get more water with a walker.  He reached for a cup and doesn't remember anything until he woke up in the floor.  He phone was in another room and he eventually very slowly scooted himself to reach it.  Last vomiting/diarrhea was in the middle of the night.  He felt dizzy with standing overnight.  He doesn't think it was related to anything he ate.  Currently his neck is all that hurts.  2/16 with 2 episodes of diarrhea today 2/17 GI panel positive for norovirus.  Continues to have profuse diarrhea  Consultants:    Procedures:   Antimicrobials:  Ceftriaxone and azithromycin   Subjective: No abdominal pain, nausea or vomiting.  Still with lots of diarrhea  Objective: Vitals:   02/22/21 0456 02/22/21 0850 02/22/21 1751 02/23/21 0519  BP: (!) 120/49 (!) 117/48 (!) 149/76 (!) 132/55  Pulse: 60 (!) 58 64 (!) 56  Resp: 19 16 17 17   Temp: 98 F (36.7 C) 98 F (36.7 C) 98.2 F (36.8 C) 98 F (36.7 C)  TempSrc: Oral Oral Oral   SpO2: 93% 95% 98% 95%  Weight:      Height:        Intake/Output Summary (Last 24 hours) at 02/23/2021 1752 Last data filed at 02/23/2021 1600 Gross per 24 hour  Intake 1535.31 ml   Output --  Net 1535.31 ml   Filed Weights   02/21/21 0826 02/21/21 1844  Weight: 81.6 kg 84 kg    Examination: In foul mood , frustrated (due to diarrhea) Cta no w/r Reg s1/s2 no gallop Soft benign +bs No edema Aaoxox3  Mood foul    Data Reviewed: I have personally reviewed following labs and imaging studies  CBC: Recent Labs  Lab 02/21/21 0811 02/21/21 0820 02/22/21 0224 02/23/21 0356  WBC 12.8*  --  10.1  --   HGB 14.8 15.3 11.4* 11.3*  HCT 43.3 45.0 33.0* 33.0*  MCV 95.8  --  95.4  --   PLT 174  --  138*  --    Basic Metabolic Panel: Recent Labs  Lab 02/21/21 0811 02/21/21 0820 02/22/21 0224 02/23/21 0356  NA 137 136 136 138  K 4.5 4.8 4.1 4.0  CL 106 110 107 109  CO2 19*  --  23 23  GLUCOSE 151* 149* 119* 106*  BUN 49* 59* 45* 30*  CREATININE 2.21* 2.00* 1.52* 1.21  CALCIUM 8.7*  --  8.2* 8.0*   GFR: Estimated Creatinine Clearance: 47.1 mL/min (by C-G formula based on SCr of 1.21 mg/dL). Liver Function Tests: Recent Labs  Lab 02/21/21 0811  AST 27  ALT 16  ALKPHOS 52  BILITOT 0.8  PROT 5.9*  ALBUMIN 3.2*   No results for input(s): LIPASE, AMYLASE in the last 168 hours. No results for input(s): AMMONIA in the last 168 hours. Coagulation Profile: Recent Labs  Lab 02/21/21 0811  INR 1.2   Cardiac Enzymes: No results for input(s): CKTOTAL, CKMB, CKMBINDEX, TROPONINI in the last 168 hours. BNP (last 3 results) No results for input(s): PROBNP in the last 8760 hours. HbA1C: No results for input(s): HGBA1C in the last 72 hours. CBG: Recent Labs  Lab 02/21/21 0751  GLUCAP 134*   Lipid Profile: No results for input(s): CHOL, HDL, LDLCALC, TRIG, CHOLHDL, LDLDIRECT in the last 72 hours. Thyroid Function Tests: No results for input(s): TSH, T4TOTAL, FREET4, T3FREE, THYROIDAB in the last 72 hours. Anemia Panel: No results for input(s): VITAMINB12, FOLATE, FERRITIN, TIBC, IRON, RETICCTPCT in the last 72 hours. Sepsis Labs: Recent Labs   Lab 02/21/21 0811  LATICACIDVEN 2.8*    Recent Results (from the past 240 hour(s))  Resp Panel by RT-PCR (Flu A&B, Covid) Nasopharyngeal Swab     Status: None   Collection Time: 02/21/21  8:25 AM   Specimen: Nasopharyngeal Swab; Nasopharyngeal(NP) swabs in vial transport medium  Result Value Ref Range Status   SARS Coronavirus 2 by RT PCR NEGATIVE NEGATIVE Final    Comment: (NOTE) SARS-CoV-2 target nucleic acids are NOT DETECTED.  The SARS-CoV-2 RNA is generally detectable in upper respiratory specimens during the acute phase of infection. The lowest concentration of SARS-CoV-2 viral copies this assay can detect is 138 copies/mL. A negative result does not preclude SARS-Cov-2 infection and should not be used as the sole basis for treatment or other patient management decisions. A negative result may occur with  improper specimen collection/handling, submission of specimen other than nasopharyngeal swab, presence of viral mutation(s) within the areas targeted by this assay, and inadequate number of viral copies(<138 copies/mL). A negative result must be combined with clinical observations, patient history, and epidemiological information. The expected result is Negative.  Fact Sheet for Patients:  EntrepreneurPulse.com.au  Fact Sheet for Healthcare Providers:  IncredibleEmployment.be  This test is no t yet approved or cleared by the [place] FDA and  has been authorized for detection and/or diagnosis of SARS-CoV-2 by FDA under an Emergency Use Authorization (EUA). This EUA will remain  in effect (meaning this test can be used) for the duration of the COVID-19 declaration under Section 564(b)(1) of the Act, 21 U.S.C.section 360bbb-3(b)(1), unless the authorization is terminated  or revoked sooner.       Influenza A by PCR NEGATIVE NEGATIVE Final   Influenza B by PCR NEGATIVE NEGATIVE Final    Comment: (NOTE) The Xpert Xpress  SARS-CoV-2/FLU/RSV plus assay is intended as an aid in the diagnosis of influenza from Nasopharyngeal swab specimens and should not be used as a sole basis for treatment. Nasal washings and aspirates are unacceptable for Xpert Xpress SARS-CoV-2/FLU/RSV testing.  Fact Sheet for Patients: EntrepreneurPulse.com.au  Fact Sheet for Healthcare Providers: IncredibleEmployment.be  This test is not yet approved or cleared by the [place] FDA and has been authorized for detection and/or diagnosis of SARS-CoV-2 by FDA under an Emergency Use Authorization (EUA). This EUA will remain in effect (meaning this test can be used) for the duration of the COVID-19 declaration under Section 564(b)(1) of the Act, 21 U.S.C. section 360bbb-3(b)(1), unless the authorization is terminated or revoked.  Performed at [facility], [place] [address], [place], [place] [address]   Gastrointestinal  Panel by PCR , Stool     Status: Abnormal   Collection Time: 02/22/21  5:17 PM   Specimen: Stool  Result Value Ref Range Status   Campylobacter species NOT DETECTED NOT DETECTED Final   Plesimonas shigelloides NOT DETECTED NOT DETECTED Final   Salmonella species NOT DETECTED NOT DETECTED Final   Yersinia enterocolitica NOT DETECTED NOT DETECTED Final   Vibrio species NOT DETECTED NOT DETECTED Final   Vibrio cholerae NOT DETECTED NOT DETECTED Final   Enteroaggregative E coli (EAEC) NOT DETECTED NOT DETECTED Final   Enteropathogenic E coli (EPEC) NOT DETECTED NOT DETECTED Final   Enterotoxigenic E coli (ETEC) NOT DETECTED NOT DETECTED Final   Shiga like toxin producing E coli (STEC) NOT DETECTED NOT DETECTED Final   Shigella/Enteroinvasive E coli (EIEC) NOT DETECTED NOT DETECTED Final   Cryptosporidium NOT DETECTED NOT DETECTED Final   Cyclospora cayetanensis NOT DETECTED NOT DETECTED Final   Entamoeba histolytica NOT DETECTED NOT DETECTED Final   Giardia lamblia NOT  DETECTED NOT DETECTED Final   Adenovirus F40/41 NOT DETECTED NOT DETECTED Final   Astrovirus NOT DETECTED NOT DETECTED Final   Norovirus GI/GII DETECTED (A) NOT DETECTED Final    Comment: RESULT CALLED TO, READ BACK BY AND VERIFIED WITH: CANDICE APPLEWHITE @1315  02/23/21 MJU    Rotavirus A NOT DETECTED NOT DETECTED Final   Sapovirus (I, II, IV, and V) NOT DETECTED NOT DETECTED Final    Comment: Performed at [facility], [address], [place], [place] [address]         Radiology Studies: No results found.      Scheduled Meds:  acetaminophen  1,000 mg Oral QHS   acidophilus  1 capsule Oral TID   calcium carbonate  1 tablet Oral Daily   clopidogrel  75 mg Oral Daily   enoxaparin (LOVENOX) injection  40 mg Subcutaneous Q24H   levothyroxine  125 mcg Oral Q0600   meclizine  25 mg Oral TID   melatonin  3 mg Oral QHS   pantoprazole  40 mg Oral Daily   rosuvastatin  10 mg Oral Daily   sodium chloride flush  3 mL Intravenous Q12H   Continuous Infusions:  azithromycin (ZITHROMAX) 500 MG IVPB (Vial-Mate [employer]) 500 mg (02/23/21 1047)   cefTRIAXone (ROCEPHIN)  IV 1 g (02/23/21 0843)   lactated ringers 75 mL/hr at 02/23/21 1555    Assessment & Plan:   Principal Problem:   Syncope and collapse Active Problems:   Hypothyroidism   Essential hypertension   Acute kidney injury (HCC)   Nausea vomiting and diarrhea   Abnormal chest x-ray   DNR (do not resuscitate)   Dyslipidemia   Syncope and collapse- (present on admission) Due to hypovolemia from diarrhea 2/17 continue with IV fluids since he continues to have diarrhea   Vertigo Continue with meclizine  PNA On ct scan Continue with ceftriaxone and azithromycin Change probiotics   Dyslipidemia- (present on admission) Continue Crestor        Acute gastroenteritis Due to norovirus Continue supportive care/IV fluids     Acute kidney injury ([place])- (present on admission) Resolved with ivf 2/2  prerenal 2.21>>1.52>>>1.21 Avoid nephrotoxic meds   Essential hypertension- (present on admission) Due to hypovolemia and bp nml, will dc amlodipine for now   Hypothyroidism- (present on admission) -Continue synthroid       DVT prophylaxis: Lovenox Code Status: DNR Family Communication: None at bedside Disposition Plan:  Status is: Inpatient The patient remains inpatient due to still  having profuse diarrhea and needing IV treatment.  Consult PT                 LOS: 1 day   Time spent: 35 minutes     Nolberto Hanlon, MD [facility] Pager [phone]  If 7PM-7AM, please contact night-coverage 02/23/2021, 5:52 PM

## 2021-02-24 LAB — POTASSIUM: Potassium: 4 mmol/L (ref 3.5–5.1)

## 2021-02-24 LAB — LACTIC ACID, PLASMA: Lactic Acid, Venous: 0.6 mmol/L (ref 0.5–1.9)

## 2021-02-24 MED ORDER — SALINE SPRAY 0.65 % NA SOLN
1.0000 | NASAL | Status: DC | PRN
  Filled 2021-02-24: qty 44

## 2021-02-24 MED ORDER — MECLIZINE HCL 25 MG PO TABS
50.0000 mg | ORAL_TABLET | Freq: Three times a day (TID) | ORAL | Status: DC
  Administered 2021-02-24 – 2021-02-28 (×12): 50 mg via ORAL
  Filled 2021-02-24 (×11): qty 2

## 2021-02-24 MED ORDER — AZITHROMYCIN 500 MG PO TABS
500.0000 mg | ORAL_TABLET | Freq: Every day | ORAL | Status: AC
  Administered 2021-02-25: 500 mg via ORAL
  Filled 2021-02-24: qty 1

## 2021-02-24 NOTE — Plan of Care (Signed)
  Problem: Education: Goal: Knowledge of condition and prescribed therapy will improve Outcome: Progressing   Problem: Cardiac: Goal: Will achieve and/or maintain adequate cardiac output Outcome: Progressing   Problem: Physical Regulation: Goal: Complications related to the disease process, condition or treatment will be avoided or minimized Outcome: Progressing   

## 2021-02-24 NOTE — Progress Notes (Signed)
PROGRESS NOTE    Walter [name]  PPJ:[id number] DOB: 10-Nov-1933 DOA: 02/21/2021 PCP: Alroy Dust, L.Marlou Sa, MD    Brief Narrative:  Walter [name] is a 88 y.o. male with medical history significant of stage 3 CKD; HTN; HLD; PAD; and hypothyroidism presenting with syncope. He started with n/v/d yesterday afternoon.  He was increasingly sick.  He laid down but symptoms continued.  He finally went to sleep and woke up terribly thirsty.  He started walking down the hall and things started moving and he fell, hitting his head against the wall.  He did lose consciousness, unsure how long he was down.  He was unable to get up, but reached some water.  He dragged himself to the couch and managed to get himself up onto the couch and again went to sleep for uncertain period of time.  He went to get more water with a walker.  He reached for a cup and doesn't remember anything until he woke up in the floor.  He phone was in another room and he eventually very slowly scooted himself to reach it.  Last vomiting/diarrhea was in the middle of the night.  He felt dizzy with standing overnight.  He doesn't think it was related to anything he ate.  Currently his neck is all that hurts.  2/16 with 2 episodes of diarrhea today 2/17 GI panel positive for norovirus.  Continues to have profuse diarrhea 2/18 diarrhea slowing down no other complaints  Consultants:    Procedures:   Antimicrobials:  Ceftriaxone and azithromycin   Subjective: No abdominal pain, shortness of breath or chest pain  Objective: Vitals:   02/23/21 1855 02/23/21 2104 02/24/21 0408 02/24/21 0918  BP: (!) 148/60 (!) 154/65 (!) 125/59 (!) 163/61  Pulse: (!) 58 (!) 51 (!) 52 (!) 53  Resp: 16 19 16 17   Temp: 98.6 F (37 C) 98.5 F (36.9 C) 98.5 F (36.9 C) 98 F (36.7 C)  TempSrc: Oral Oral Oral Oral  SpO2: 97% 96% 96% 97%  Weight:      Height:        Intake/Output Summary (Last 24 hours) at 02/24/2021 1409 Last data filed at 02/24/2021  1257 Gross per 24 hour  Intake 3120.26 ml  Output --  Net 3120.26 ml   Filed Weights   02/21/21 0826 02/21/21 1844  Weight: 81.6 kg 84 kg    Examination: Calm, NAD Cta no w/r Reg s1/s2 no gallop Soft benign +bs No edema Aaoxox3  Mood and affect appropriate in current setting     Data Reviewed: I have personally reviewed following labs and imaging studies  CBC: Recent Labs  Lab 02/21/21 0811 02/21/21 0820 02/22/21 0224 02/23/21 0356  WBC 12.8*  --  10.1  --   HGB 14.8 15.3 11.4* 11.3*  HCT 43.3 45.0 33.0* 33.0*  MCV 95.8  --  95.4  --   PLT 174  --  138*  --    Basic Metabolic Panel: Recent Labs  Lab 02/21/21 0811 02/21/21 0820 02/22/21 0224 02/23/21 0356 02/24/21 0212  NA 137 136 136 138  --   K 4.5 4.8 4.1 4.0 4.0  CL 106 110 107 109  --   CO2 19*  --  23 23  --   GLUCOSE 151* 149* 119* 106*  --   BUN 49* 59* 45* 30*  --   CREATININE 2.21* 2.00* 1.52* 1.21  --   CALCIUM 8.7*  --  8.2* 8.0*  --  GFR: Estimated Creatinine Clearance: 47.1 mL/min (by C-G formula based on SCr of 1.21 mg/dL). Liver Function Tests: Recent Labs  Lab 02/21/21 0811  AST 27  ALT 16  ALKPHOS 52  BILITOT 0.8  PROT 5.9*  ALBUMIN 3.2*   No results for input(s): LIPASE, AMYLASE in the last 168 hours. No results for input(s): AMMONIA in the last 168 hours. Coagulation Profile: Recent Labs  Lab 02/21/21 0811  INR 1.2   Cardiac Enzymes: No results for input(s): CKTOTAL, CKMB, CKMBINDEX, TROPONINI in the last 168 hours. BNP (last 3 results) No results for input(s): PROBNP in the last 8760 hours. HbA1C: No results for input(s): HGBA1C in the last 72 hours. CBG: Recent Labs  Lab 02/21/21 0751  GLUCAP 134*   Lipid Profile: No results for input(s): CHOL, HDL, LDLCALC, TRIG, CHOLHDL, LDLDIRECT in the last 72 hours. Thyroid Function Tests: No results for input(s): TSH, T4TOTAL, FREET4, T3FREE, THYROIDAB in the last 72 hours. Anemia Panel: No results for input(s):  VITAMINB12, FOLATE, FERRITIN, TIBC, IRON, RETICCTPCT in the last 72 hours. Sepsis Labs: Recent Labs  Lab 02/21/21 1950 02/24/21 0212  LATICACIDVEN 2.8* 0.6    Recent Results (from the past 240 hour(s))  Resp Panel by RT-PCR (Flu A&B, Covid) Nasopharyngeal Swab     Status: None   Collection Time: 02/21/21  8:25 AM   Specimen: Nasopharyngeal Swab; Nasopharyngeal(NP) swabs in vial transport medium  Result Value Ref Range Status   SARS Coronavirus 2 by RT PCR NEGATIVE NEGATIVE Final    Comment: (NOTE) SARS-CoV-2 target nucleic acids are NOT DETECTED.  The SARS-CoV-2 RNA is generally detectable in upper respiratory specimens during the acute phase of infection. The lowest concentration of SARS-CoV-2 viral copies this assay can detect is 138 copies/mL. A negative result does not preclude SARS-Cov-2 infection and should not be used as the sole basis for treatment or other patient management decisions. A negative result may occur with  improper specimen collection/handling, submission of specimen other than nasopharyngeal swab, presence of viral mutation(s) within the areas targeted by this assay, and inadequate number of viral copies(<138 copies/mL). A negative result must be combined with clinical observations, patient history, and epidemiological information. The expected result is Negative.  Fact Sheet for Patients:  EntrepreneurPulse.com.au  Fact Sheet for Healthcare Providers:  IncredibleEmployment.be  This test is no t yet approved or cleared by the [place] FDA and  has been authorized for detection and/or diagnosis of SARS-CoV-2 by FDA under an Emergency Use Authorization (EUA). This EUA will remain  in effect (meaning this test can be used) for the duration of the COVID-19 declaration under Section 564(b)(1) of the Act, 21 U.S.C.section 360bbb-3(b)(1), unless the authorization is terminated  or revoked sooner.       Influenza A  by PCR NEGATIVE NEGATIVE Final   Influenza B by PCR NEGATIVE NEGATIVE Final    Comment: (NOTE) The Xpert Xpress SARS-CoV-2/FLU/RSV plus assay is intended as an aid in the diagnosis of influenza from Nasopharyngeal swab specimens and should not be used as a sole basis for treatment. Nasal washings and aspirates are unacceptable for Xpert Xpress SARS-CoV-2/FLU/RSV testing.  Fact Sheet for Patients: EntrepreneurPulse.com.au  Fact Sheet for Healthcare Providers: IncredibleEmployment.be  This test is not yet approved or cleared by the [place] FDA and has been authorized for detection and/or diagnosis of SARS-CoV-2 by FDA under an Emergency Use Authorization (EUA). This EUA will remain in effect (meaning this test can be used) for the duration of the COVID-19 declaration  under Section 564(b)(1) of the Act, 21 U.S.C. section 360bbb-3(b)(1), unless the authorization is terminated or revoked.  Performed at [facility], [place] [address], [place], [place] [address]   Gastrointestinal Panel by PCR , Stool     Status: Abnormal   Collection Time: 02/22/21  5:17 PM   Specimen: Stool  Result Value Ref Range Status   Campylobacter species NOT DETECTED NOT DETECTED Final   Plesimonas shigelloides NOT DETECTED NOT DETECTED Final   Salmonella species NOT DETECTED NOT DETECTED Final   Yersinia enterocolitica NOT DETECTED NOT DETECTED Final   Vibrio species NOT DETECTED NOT DETECTED Final   Vibrio cholerae NOT DETECTED NOT DETECTED Final   Enteroaggregative E coli (EAEC) NOT DETECTED NOT DETECTED Final   Enteropathogenic E coli (EPEC) NOT DETECTED NOT DETECTED Final   Enterotoxigenic E coli (ETEC) NOT DETECTED NOT DETECTED Final   Shiga like toxin producing E coli (STEC) NOT DETECTED NOT DETECTED Final   Shigella/Enteroinvasive E coli (EIEC) NOT DETECTED NOT DETECTED Final   Cryptosporidium NOT DETECTED NOT DETECTED Final   Cyclospora cayetanensis NOT  DETECTED NOT DETECTED Final   Entamoeba histolytica NOT DETECTED NOT DETECTED Final   Giardia lamblia NOT DETECTED NOT DETECTED Final   Adenovirus F40/41 NOT DETECTED NOT DETECTED Final   Astrovirus NOT DETECTED NOT DETECTED Final   Norovirus GI/GII DETECTED (A) NOT DETECTED Final    Comment: RESULT CALLED TO, READ BACK BY AND VERIFIED WITH: CANDICE APPLEWHITE @1315  02/23/21 MJU    Rotavirus A NOT DETECTED NOT DETECTED Final   Sapovirus (I, II, IV, and V) NOT DETECTED NOT DETECTED Final    Comment: Performed at [facility], [address] [place], [place] [address]         Radiology Studies: No results found.      Scheduled Meds:  acetaminophen  1,000 mg Oral QHS   acidophilus  1 capsule Oral TID   [START ON 02/25/2021] azithromycin  500 mg Oral Daily   calcium carbonate  1 tablet Oral Daily   clopidogrel  75 mg Oral Daily   enoxaparin (LOVENOX) injection  40 mg Subcutaneous Q24H   levothyroxine  125 mcg Oral Q0600   meclizine  25 mg Oral TID   melatonin  3 mg Oral QHS   pantoprazole  40 mg Oral Daily   rosuvastatin  10 mg Oral Daily   sodium chloride flush  3 mL Intravenous Q12H   Continuous Infusions:  cefTRIAXone (ROCEPHIN)  IV 1 g (02/24/21 0906)   lactated ringers 75 mL/hr at 02/24/21 0533    Assessment & Plan:   Principal Problem:   Syncope and collapse Active Problems:   Hypothyroidism   Essential hypertension   Acute kidney injury (HCC)   Nausea vomiting and diarrhea   Abnormal chest x-ray   DNR (do not resuscitate)   Dyslipidemia   Syncope and collapse- (present on admission) Due to hypovolemia from diarrhea 2/18 continue with IV fluids until diarrhea resolves   Vertigo Increase meclizine dose as he still passing positional dizziness   PNA On ct scan 2/18 continue with ceftriaxone administered -2 completed 5 days of treatment Continue probiotics    Acute kidney injury ([place])- (present on admission) Due to prerenal Resolved  with IV fluids Continue to avoid nephrotoxic meds   Dyslipidemia- (present on admission) Continue Crestor        Acute gastroenteritis Due to norovirus Continue supportive care/IV fluids        Essential hypertension- (present on admission)  Due to hypovolemia and bp nml, will dc amlodipine for now   Hypothyroidism- (present on admission) -Continue synthroid       DVT prophylaxis: Lovenox Code Status: DNR Family Communication: None at bedside Disposition Plan:  Status is: Inpatient The patient remains inpatient due IV treatment, still with diarrhea               LOS: 2 days   Time spent: 35 minutes     Nolberto Hanlon, MD [facility] Pager [phone]  If 7PM-7AM, please contact night-coverage 02/24/2021, 2:09 PM

## 2021-02-24 NOTE — Evaluation (Signed)
Physical Therapy Evaluation Patient Details Name: Walter [name] MRN: [id number] DOB: 07/08/1933 Today's Date: 02/24/2021  History of Present Illness  88 y/o male presented to ED on 02/21/21 after fall x 2 with syncopal episode. Imaging negative. Orthostatics negative. Persistent dizziness with transitional movements. PMH: CKD, HTN, PVD, hypothyroidism  Clinical Impression  Patient admitted with above problem. Patient presents with generalized weakness, impaired balance, and dizziness. Patient reports dizziness with transitional movements but no nystagmus noted. Patient requires supervision-min guard for short ambulation with RW but has downward gaze preference. Distance limited by fear of dizziness. Patient will benefit from skilled PT services during acute stay to address listed deficits. Recommend HHPT at this time to maximize functional independence. Will have vestibular PT evaluate next session.     Recommendations for follow up therapy are one component of a multi-disciplinary discharge planning process, led by the attending physician.  Recommendations may be updated based on patient status, additional functional criteria and insurance authorization.  Follow Up Recommendations Home health PT    Assistance Recommended at Discharge Intermittent Supervision/Assistance  Patient can return home with the following  Assistance with cooking/housework;Assist for transportation    Equipment Recommendations None recommended by PT  Recommendations for Other Services       Functional Status Assessment Patient has had a recent decline in their functional status and demonstrates the ability to make significant improvements in function in a reasonable and predictable amount of time.     Precautions / Restrictions Precautions Precautions: Fall Precaution Comments: dizziness with transitional movements Restrictions Weight Bearing Restrictions: No      Mobility  Bed Mobility Overal bed  mobility: Modified Independent             General bed mobility comments: dizziness upon sitting. Unable to observe for nystagmus as patient sat up prematurely before therapist was finished setting up room    Transfers Overall transfer level: Needs assistance Equipment used: Rolling Geraldin Habermehl (2 wheels) Transfers: Sit to/from Stand Sit to Stand: Supervision           General transfer comment: supervision for safety. Cues for hand placement. Dizziness upon standing but no nystagmus observed.    Ambulation/Gait Ambulation/Gait assistance: Min guard Gait Distance (Feet): 20 Feet Assistive device: Rolling Danish Ruffins (2 wheels) Gait Pattern/deviations: Step-through pattern, Decreased stride length Gait velocity: decreased     General Gait Details: min guard with downward gaze preference to prevent dizziness. Distance limited by fear of dizziness  Stairs            Wheelchair Mobility    Modified Rankin (Stroke Patients Only)       Balance Overall balance assessment: Needs assistance Sitting-balance support: No upper extremity supported, Feet supported Sitting balance-Leahy Scale: Good     Standing balance support: Bilateral upper extremity supported, Reliant on assistive device for balance Standing balance-Leahy Scale: Poor                               Pertinent Vitals/Pain Pain Assessment Pain Assessment: No/denies pain    Home Living Family/patient expects to be discharged to:: Private residence Living Arrangements: Alone Available Help at Discharge: Friend(s);Available PRN/intermittently Type of Home: House Home Access: Level entry       Home Layout: One level Home Equipment: Conservation officer, nature (2 wheels);Cane - single point Additional Comments: per chart, wife is in SNF    Prior Function Prior Level of Function : Independent/Modified Independent;Driving  Mobility Comments: uses cane for long distances       Hand  Dominance        Extremity/Trunk Assessment   Upper Extremity Assessment Upper Extremity Assessment: Overall WFL for tasks assessed    Lower Extremity Assessment Lower Extremity Assessment: Generalized weakness    Cervical / Trunk Assessment Cervical / Trunk Assessment: Normal  Communication   Communication: No difficulties  Cognition Arousal/Alertness: Awake/alert Behavior During Therapy: WFL for tasks assessed/performed Overall Cognitive Status: Within Functional Limits for tasks assessed                                          General Comments      Exercises     Assessment/Plan    PT Assessment Patient needs continued PT services  PT Problem List Decreased strength;Decreased activity tolerance;Decreased balance;Decreased mobility       PT Treatment Interventions DME instruction;Gait training;Functional mobility training;Therapeutic activities;Therapeutic exercise;Balance training;Patient/family education    PT Goals (Current goals can be found in the Care Plan section)  Acute Rehab PT Goals Patient Stated Goal: to get rid of the dizziness PT Goal Formulation: With patient Time For Goal Achievement: 03/10/21 Potential to Achieve Goals: Good    Frequency Min 3X/week     Co-evaluation               AM-PAC PT "6 Clicks" Mobility  Outcome Measure Help needed turning from your back to your side while in a flat bed without using bedrails?: None Help needed moving from lying on your back to sitting on the side of a flat bed without using bedrails?: None Help needed moving to and from a bed to a chair (including a wheelchair)?: A Little Help needed standing up from a chair using your arms (e.g., wheelchair or bedside chair)?: A Little Help needed to walk in hospital room?: A Little Help needed climbing 3-5 steps with a railing? : A Little 6 Click Score: 20    End of Session Equipment Utilized During Treatment: Gait belt Activity  Tolerance: Patient tolerated treatment well Patient left: in chair;with call bell/phone within reach;with chair alarm set Nurse Communication: Mobility status PT Visit Diagnosis: Unsteadiness on feet (R26.81);Muscle weakness (generalized) (M62.81);Dizziness and giddiness (R42)    Time: 1062-6948 PT Time Calculation (min) (ACUTE ONLY): 25 min   Charges:   PT Evaluation $PT Eval Low Complexity: 1 Low PT Treatments $Gait Training: 8-22 mins        Rheba Diamond A. Gilford Rile PT, DPT Acute Rehabilitation Services Pager [phone] Office [phone]   Linna Hoff 02/24/2021, 10:48 AM

## 2021-02-24 NOTE — Plan of Care (Signed)
°  Problem: Education: Goal: Knowledge of condition and prescribed therapy will improve Outcome: Progressing   

## 2021-02-25 MED ORDER — AMLODIPINE BESYLATE 5 MG PO TABS
5.0000 mg | ORAL_TABLET | Freq: Every day | ORAL | Status: DC
  Administered 2021-02-25: 5 mg via ORAL
  Filled 2021-02-25: qty 1

## 2021-02-25 NOTE — Progress Notes (Signed)
PROGRESS NOTE    Walter [name]  IRJ:[id number] DOB: [birth year] DOA: 02/21/2021 PCP: Alroy Dust, L.Marlou Sa, MD    Brief Narrative:  Walter [name] is a 88 y.o. male with medical history significant of stage 3 CKD; HTN; HLD; PAD; and hypothyroidism presenting with syncope. He started with n/v/d yesterday afternoon.  He was increasingly sick.  He laid down but symptoms continued.  He finally went to sleep and woke up terribly thirsty.  He started walking down the hall and things started moving and he fell, hitting his head against the wall.  He did lose consciousness, unsure how long he was down.  He was unable to get up, but reached some water.  He dragged himself to the couch and managed to get himself up onto the couch and again went to sleep for uncertain period of time.  He went to get more water with a walker.  He reached for a cup and doesn't remember anything until he woke up in the floor.  He phone was in another room and he eventually very slowly scooted himself to reach it.  Last vomiting/diarrhea was in the middle of the night.  He felt dizzy with standing overnight.  He doesn't think it was related to anything he ate.  Currently his neck is all that hurts.  2/16 with 2 episodes of diarrhea today 2/17 GI panel positive for norovirus.  Continues to have profuse diarrhea 2/18 diarrhea slowing down no other complaints 2/19 +orthostatics today. No diarrhea today  Consultants:    Procedures:   Antimicrobials:  Ceftriaxone and azithromycin   Subjective: No sob, cp, abd pain   Objective: Vitals:   02/24/21 0918 02/24/21 2038 02/25/21 0551 02/25/21 0847  BP: (!) 163/61 (!) 158/60 (!) 173/57 (!) 175/69  Pulse: (!) 53 (!) 53 (!) 50 (!) 54  Resp: 17 18 19 16   Temp: 98 F (36.7 C) 98.1 F (36.7 C) 98.7 F (37.1 C) 98.1 F (36.7 C)  TempSrc: Oral Oral Oral Oral  SpO2: 97% 98% 98% 95%  Weight:      Height:        Intake/Output Summary (Last 24 hours) at 02/25/2021 1638 Last data  filed at 02/25/2021 1500 Gross per 24 hour  Intake 760 ml  Output --  Net 760 ml   Filed Weights   02/21/21 0826 02/21/21 1844  Weight: 81.6 kg 84 kg    Examination: Calm, NAD Cta no w/r Reg s1/s2 no gallop Soft benign +bs Trace pedal edema Aaoxox3  Mood and affect appropriate in current setting     Data Reviewed: I have personally reviewed following labs and imaging studies  CBC: Recent Labs  Lab 02/21/21 0811 02/21/21 0820 02/22/21 0224 02/23/21 0356  WBC 12.8*  --  10.1  --   HGB 14.8 15.3 11.4* 11.3*  HCT 43.3 45.0 33.0* 33.0*  MCV 95.8  --  95.4  --   PLT 174  --  138*  --    Basic Metabolic Panel: Recent Labs  Lab 02/21/21 0811 02/21/21 0820 02/22/21 0224 02/23/21 0356 02/24/21 0212  NA 137 136 136 138  --   K 4.5 4.8 4.1 4.0 4.0  CL 106 110 107 109  --   CO2 19*  --  23 23  --   GLUCOSE 151* 149* 119* 106*  --   BUN 49* 59* 45* 30*  --   CREATININE 2.21* 2.00* 1.52* 1.21  --   CALCIUM 8.7*  --  8.2* 8.0*  --  GFR: Estimated Creatinine Clearance: 47.1 mL/min (by C-G formula based on SCr of 1.21 mg/dL). Liver Function Tests: Recent Labs  Lab 02/21/21 0811  AST 27  ALT 16  ALKPHOS 52  BILITOT 0.8  PROT 5.9*  ALBUMIN 3.2*   No results for input(s): LIPASE, AMYLASE in the last 168 hours. No results for input(s): AMMONIA in the last 168 hours. Coagulation Profile: Recent Labs  Lab 02/21/21 0811  INR 1.2   Cardiac Enzymes: No results for input(s): CKTOTAL, CKMB, CKMBINDEX, TROPONINI in the last 168 hours. BNP (last 3 results) No results for input(s): PROBNP in the last 8760 hours. HbA1C: No results for input(s): HGBA1C in the last 72 hours. CBG: Recent Labs  Lab 02/21/21 0751  GLUCAP 134*   Lipid Profile: No results for input(s): CHOL, HDL, LDLCALC, TRIG, CHOLHDL, LDLDIRECT in the last 72 hours. Thyroid Function Tests: No results for input(s): TSH, T4TOTAL, FREET4, T3FREE, THYROIDAB in the last 72 hours. Anemia Panel: No  results for input(s): VITAMINB12, FOLATE, FERRITIN, TIBC, IRON, RETICCTPCT in the last 72 hours. Sepsis Labs: Recent Labs  Lab 02/21/21 3329 02/24/21 0212  LATICACIDVEN 2.8* 0.6    Recent Results (from the past 240 hour(s))  Resp Panel by RT-PCR (Flu A&B, Covid) Nasopharyngeal Swab     Status: None   Collection Time: 02/21/21  8:25 AM   Specimen: Nasopharyngeal Swab; Nasopharyngeal(NP) swabs in vial transport medium  Result Value Ref Range Status   SARS Coronavirus 2 by RT PCR NEGATIVE NEGATIVE Final    Comment: (NOTE) SARS-CoV-2 target nucleic acids are NOT DETECTED.  The SARS-CoV-2 RNA is generally detectable in upper respiratory specimens during the acute phase of infection. The lowest concentration of SARS-CoV-2 viral copies this assay can detect is 138 copies/mL. A negative result does not preclude SARS-Cov-2 infection and should not be used as the sole basis for treatment or other patient management decisions. A negative result may occur with  improper specimen collection/handling, submission of specimen other than nasopharyngeal swab, presence of viral mutation(s) within the areas targeted by this assay, and inadequate number of viral copies(<138 copies/mL). A negative result must be combined with clinical observations, patient history, and epidemiological information. The expected result is Negative.  Fact Sheet for Patients:  EntrepreneurPulse.com.au  Fact Sheet for Healthcare Providers:  IncredibleEmployment.be  This test is no t yet approved or cleared by the [place] FDA and  has been authorized for detection and/or diagnosis of SARS-CoV-2 by FDA under an Emergency Use Authorization (EUA). This EUA will remain  in effect (meaning this test can be used) for the duration of the COVID-19 declaration under Section 564(b)(1) of the Act, 21 U.S.C.section 360bbb-3(b)(1), unless the authorization is terminated  or revoked sooner.        Influenza A by PCR NEGATIVE NEGATIVE Final   Influenza B by PCR NEGATIVE NEGATIVE Final    Comment: (NOTE) The Xpert Xpress SARS-CoV-2/FLU/RSV plus assay is intended as an aid in the diagnosis of influenza from Nasopharyngeal swab specimens and should not be used as a sole basis for treatment. Nasal washings and aspirates are unacceptable for Xpert Xpress SARS-CoV-2/FLU/RSV testing.  Fact Sheet for Patients: EntrepreneurPulse.com.au  Fact Sheet for Healthcare Providers: IncredibleEmployment.be  This test is not yet approved or cleared by the [place] FDA and has been authorized for detection and/or diagnosis of SARS-CoV-2 by FDA under an Emergency Use Authorization (EUA). This EUA will remain in effect (meaning this test can be used) for the duration of the COVID-19 declaration  under Section 564(b)(1) of the Act, 21 U.S.C. section 360bbb-3(b)(1), unless the authorization is terminated or revoked.  Performed at [facility], [place] [address], [place], [place] [address]   Gastrointestinal Panel by PCR , Stool     Status: Abnormal   Collection Time: 02/22/21  5:17 PM   Specimen: Stool  Result Value Ref Range Status   Campylobacter species NOT DETECTED NOT DETECTED Final   Plesimonas shigelloides NOT DETECTED NOT DETECTED Final   Salmonella species NOT DETECTED NOT DETECTED Final   Yersinia enterocolitica NOT DETECTED NOT DETECTED Final   Vibrio species NOT DETECTED NOT DETECTED Final   Vibrio cholerae NOT DETECTED NOT DETECTED Final   Enteroaggregative E coli (EAEC) NOT DETECTED NOT DETECTED Final   Enteropathogenic E coli (EPEC) NOT DETECTED NOT DETECTED Final   Enterotoxigenic E coli (ETEC) NOT DETECTED NOT DETECTED Final   Shiga like toxin producing E coli (STEC) NOT DETECTED NOT DETECTED Final   Shigella/Enteroinvasive E coli (EIEC) NOT DETECTED NOT DETECTED Final   Cryptosporidium NOT DETECTED NOT DETECTED Final    Cyclospora cayetanensis NOT DETECTED NOT DETECTED Final   Entamoeba histolytica NOT DETECTED NOT DETECTED Final   Giardia lamblia NOT DETECTED NOT DETECTED Final   Adenovirus F40/41 NOT DETECTED NOT DETECTED Final   Astrovirus NOT DETECTED NOT DETECTED Final   Norovirus GI/GII DETECTED (A) NOT DETECTED Final    Comment: RESULT CALLED TO, READ BACK BY AND VERIFIED WITH: CANDICE APPLEWHITE @1315  02/23/21 MJU    Rotavirus A NOT DETECTED NOT DETECTED Final   Sapovirus (I, II, IV, and V) NOT DETECTED NOT DETECTED Final    Comment: Performed at [facility], [address], [place], [place] [address]         Radiology Studies: No results found.      Scheduled Meds:  acetaminophen  1,000 mg Oral QHS   acidophilus  1 capsule Oral TID   amLODipine  5 mg Oral Daily   calcium carbonate  1 tablet Oral Daily   clopidogrel  75 mg Oral Daily   enoxaparin (LOVENOX) injection  40 mg Subcutaneous Q24H   levothyroxine  125 mcg Oral Q0600   meclizine  50 mg Oral TID   melatonin  3 mg Oral QHS   pantoprazole  40 mg Oral Daily   rosuvastatin  10 mg Oral Daily   sodium chloride flush  3 mL Intravenous Q12H   Continuous Infusions:  lactated ringers 75 mL/hr at 02/24/21 0533    Assessment & Plan:   Principal Problem:   Syncope and collapse Active Problems:   Hypothyroidism   Essential hypertension   Acute kidney injury (HCC)   Nausea vomiting and diarrhea   Abnormal chest x-ray   DNR (do not resuscitate)   Dyslipidemia   Syncope and collapse- (present on admission) Due to hypovolemia from diarrhea 2/19 continue ivf as he is still orthostatics Add ecs 20-62mmHg     Vertigo Improving with meclizine   PNA On ct scan 2/19Continue iv abx to complete 5 day cours    Acute kidney injury ([place])- (present on admission) Due to prerenal 2/19 improved with ivf   Dyslipidemia- (present on admission) Continue Crestor        Acute gastroenteritis Due to  norovirus Continue supportive care/IV fluids        Essential hypertension- (present on admission) Due to hypovolemia and bp nml, will dc amlodipine for now   Hypothyroidism- (present on admission) -Continue synthroid  DVT prophylaxis: Lovenox Code Status: DNR Family Communication: None at bedside Disposition Plan:  Status is: Inpatient The patient remains inpatient due IV treatment, has orthostatics.                LOS: 3 days   Time spent: 35 minutes     Nolberto Hanlon, MD [facility] Pager [phone]  If 7PM-7AM, please contact night-coverage 02/25/2021, 4:38 PM

## 2021-02-25 NOTE — Progress Notes (Signed)
Physical Therapy Treatment Patient Details Name: Walter [name] MRN: [id number] DOB: [birth year] Today's Date: 02/25/2021   History of Present Illness 88 y/o male presented to ED on 02/21/21 after fall x 2 with syncopal episode. Imaging negative. Orthostatics negative. Persistent dizziness with transitional movements. PMH: CKD, HTN, PVD, hypothyroidism    PT Comments    Patient tested + for left posterior canal BPPV. Treated with Epley x 2 reps with good results. Patient will need ~1 hour recovery prior to mobility assessment to allow his system to "settle down." Will return in ~1 hour to assess.     Recommendations for follow up therapy are one component of a multi-disciplinary discharge planning process, led by the attending physician.  Recommendations may be updated based on patient status, additional functional criteria and insurance authorization.  Follow Up Recommendations  Home health PT     Assistance Recommended at Discharge Intermittent Supervision/Assistance  Patient can return home with the following Assistance with cooking/housework;Assist for transportation   Equipment Recommendations  None recommended by PT    Recommendations for Other Services       Precautions / Restrictions    Vestibular Assessment   02/25/21 0843  Symptom Behavior  Subjective history of current problem when I roll left, the world starts moving  Type of Dizziness  Spinning  Frequency of Dizziness multiple times per dya  Duration of Dizziness seconds  Symptom Nature Motion provoked  Aggravating Factors Rolling to left;Supine to sit;Sit to stand  Relieving Factors Head stationary  Progression of Symptoms No change since onset  Oculomotor Exam  Oculomotor Alignment Normal  Spontaneous Absent  Positional Testing  Dix-Hallpike Dix-Hallpike Left  Sidelying Test Sidelying Right  Dix-Hallpike Left  Dix-Hallpike Left Duration 20  Dix-Hallpike Left Symptoms Upbeat, left rotatory nystagmus   Sidelying Right  Sidelying Right Duration 0  Sidelying Right Symptoms No nystagmus     Vestibular Treatment  02/25/21 0843  Symptom Behavior  Subjective history of current problem when I roll left, the world starts moving  Type of Dizziness  Spinning  Frequency of Dizziness multiple times per dya  Duration of Dizziness seconds  Symptom Nature Motion provoked  Aggravating Factors Rolling to left;Supine to sit;Sit to stand  Relieving Factors Head stationary  Progression of Symptoms No change since onset  Oculomotor Exam  Oculomotor Alignment Normal  Spontaneous Absent  Positional Testing  Dix-Hallpike Dix-Hallpike Left  Sidelying Test Sidelying Right  Dix-Hallpike Left  Dix-Hallpike Left Duration 20  Dix-Hallpike Left Symptoms Upbeat, left rotatory nystagmus  Sidelying Right  Sidelying Right Duration 0  Sidelying Right Symptoms No nystagmus    Mobility  Bed Mobility Overal bed mobility: Modified Independent             General bed mobility comments: during Dix-Hallpike and Epley    Transfers                        Ambulation/Gait                   Stairs             Wheelchair Mobility    Modified Rankin (Stroke Patients Only)       Balance                                            Cognition  Exercises      General Comments        Pertinent Vitals/Pain      Home Living                          Prior Function            PT Goals (current goals can now be found in the care plan section) Acute Rehab PT Goals Time For Goal Achievement: 03/10/21 Potential to Achieve Goals: Good Progress towards PT goals: Progressing toward goals    Frequency    Min 3X/week      PT Plan Current plan remains appropriate    Co-evaluation              AM-PAC PT "6 Clicks" Mobility   Outcome Measure  Help needed  turning from your back to your side while in a flat bed without using bedrails?: None Help needed moving from lying on your back to sitting on the side of a flat bed without using bedrails?: None Help needed moving to and from a bed to a chair (including a wheelchair)?: A Little Help needed standing up from a chair using your arms (e.g., wheelchair or bedside chair)?: A Little Help needed to walk in hospital room?: A Little Help needed climbing 3-5 steps with a railing? : A Little 6 Click Score: 20    End of Session   Activity Tolerance: Treatment limited secondary to medical complications (Comment) (vertigo) Patient left: in bed;with call bell/phone within reach;with bed alarm set   PT Visit Diagnosis: Unsteadiness on feet (R26.81);Muscle weakness (generalized) (M62.81);Dizziness and giddiness (R42);BPPV BPPV - Right/Left : Left     Time: 3818-2993 PT Time Calculation (min) (ACUTE ONLY): 34 min  Charges:  $Therapeutic Activity: 8-22 mins $Canalith Rep Proc: 8-22 mins                      Arby Barrette, PT Acute Rehabilitation Services  Pager [phone] Office [phone]    Walter [name] 02/25/2021, 8:49 AM

## 2021-02-25 NOTE — Progress Notes (Signed)
Physical Therapy Treatment Patient Details Name: Walter [name] MRN: [id number] DOB: 11/10/1933 Today's Date: 02/25/2021   History of Present Illness 88 y/o male presented to ED on 02/21/21 after fall x 2 with syncopal episode. Imaging negative. Orthostatics negative. Persistent dizziness with transitional movements. PMH: CKD, HTN, PVD, hypothyroidism    PT Comments    Patient's mobility assessed with no vertigo elicited with activity except ?slight "blip" when rolling to left. RN in during session to assess orthostatic BPs and patient was positive for orthostasis, but denied dizziness. Will reassess for BPPV symptoms 2/20 a.m. prior to ?discharge home.    Recommendations for follow up therapy are one component of a multi-disciplinary discharge planning process, led by the attending physician.  Recommendations may be updated based on patient status, additional functional criteria and insurance authorization.  Follow Up Recommendations  Home health PT     Assistance Recommended at Discharge Intermittent Supervision/Assistance  Patient can return home with the following Assistance with cooking/housework;Assist for transportation   Equipment Recommendations  None recommended by PT    Recommendations for Other Services       Precautions / Restrictions Precautions Precautions: Fall Precaution Comments: dizziness with transitional movements Restrictions Weight Bearing Restrictions: No     Mobility  Bed Mobility Overal bed mobility: Modified Independent             General bed mobility comments: during Dix-Hallpike and Epley    Transfers Overall transfer level: Needs assistance Equipment used: Rolling walker (2 wheels) Transfers: Sit to/from Stand Sit to Stand: Supervision           General transfer comment: supervision for safety. Cues for hand placement.    Ambulation/Gait Ambulation/Gait assistance: Min guard Gait Distance (Feet): 10 Feet (toileted, 10;  20) Assistive device: Rolling walker (2 wheels) Gait Pattern/deviations: Step-through pattern, Decreased stride length Gait velocity: decreased     General Gait Details: denied dizziness; 3rd short bout of gait felt he could have walked without RW   Stairs             Wheelchair Mobility    Modified Rankin (Stroke Patients Only)       Balance Overall balance assessment: Needs assistance Sitting-balance support: No upper extremity supported, Feet supported Sitting balance-Leahy Scale: Good     Standing balance support: Bilateral upper extremity supported, Reliant on assistive device for balance Standing balance-Leahy Scale: Poor                              Cognition Arousal/Alertness: Awake/alert Behavior During Therapy: WFL for tasks assessed/performed Overall Cognitive Status: Within Functional Limits for tasks assessed                                          Exercises      General Comments General comments (skin integrity, edema, etc.): pt denied vertigo with all movements, including supine to sit; sit to supine; sit to stand; ?brief "blip" when rolled to his left.      Pertinent Vitals/Pain Pain Assessment Pain Assessment: No/denies pain    Home Living                          Prior Function            PT Goals (current goals can now be found  in the care plan section) Acute Rehab PT Goals Patient Stated Goal: to get rid of the dizziness Time For Goal Achievement: 03/10/21 Potential to Achieve Goals: Good Progress towards PT goals: Progressing toward goals    Frequency    Min 3X/week      PT Plan Current plan remains appropriate    Co-evaluation              AM-PAC PT "6 Clicks" Mobility   Outcome Measure  Help needed turning from your back to your side while in a flat bed without using bedrails?: None Help needed moving from lying on your back to sitting on the side of a flat bed  without using bedrails?: None Help needed moving to and from a bed to a chair (including a wheelchair)?: A Little Help needed standing up from a chair using your arms (e.g., wheelchair or bedside chair)?: A Little Help needed to walk in hospital room?: A Little Help needed climbing 3-5 steps with a railing? : A Little 6 Click Score: 20    End of Session Equipment Utilized During Treatment: Gait belt Activity Tolerance: Patient tolerated treatment well Patient left: with call bell/phone within reach;in chair;with chair alarm set Nurse Communication: Mobility status PT Visit Diagnosis: Unsteadiness on feet (R26.81);Muscle weakness (generalized) (M62.81);Dizziness and giddiness (R42);BPPV BPPV - Right/Left : Left     Time: 2122-4825 PT Time Calculation (min) (ACUTE ONLY): 24 min  Charges:  $Gait Training: 8-22 mins $Therapeutic Activity: 8-22 mins $Canalith Rep Proc: 8-22 mins                      Arby Barrette, PT Acute Rehabilitation Services  Pager [phone] Office [phone]    Rexanne Mano 02/25/2021, 10:49 AM

## 2021-02-26 MED ORDER — SODIUM CHLORIDE 0.9 % IV SOLN: INTRAVENOUS | Status: DC

## 2021-02-26 MED ORDER — AMLODIPINE BESYLATE 5 MG PO TABS
5.0000 mg | ORAL_TABLET | Freq: Every day | ORAL | Status: DC
  Administered 2021-02-26 – 2021-02-27 (×2): 5 mg via ORAL
  Filled 2021-02-26 (×2): qty 1

## 2021-02-26 NOTE — Progress Notes (Signed)
PROGRESS NOTE    Walter [name]  VQQ:[id number] DOB: 05/31/1933 DOA: 02/21/2021 PCP: Alroy Dust, L.Marlou Sa, MD    Brief Narrative:  Walter [name] is a 88 y.o. male with medical history significant of stage 3 CKD; HTN; HLD; PAD; and hypothyroidism presenting with syncope. He started with n/v/d yesterday afternoon.  He was increasingly sick.  He laid down but symptoms continued.  He finally went to sleep and woke up terribly thirsty.  He started walking down the hall and things started moving and he fell, hitting his head against the wall.  He did lose consciousness, unsure how long he was down.  He was unable to get up, but reached some water.  He dragged himself to the couch and managed to get himself up onto the couch and again went to sleep for uncertain period of time.  He went to get more water with a walker.  He reached for a cup and doesn't remember anything until he woke up in the floor.  He phone was in another room and he eventually very slowly scooted himself to reach it.  Last vomiting/diarrhea was in the middle of the night.  He felt dizzy with standing overnight.  He doesn't think it was related to anything he ate.  Currently his neck is all that hurts.  2/16 with 2 episodes of diarrhea today 2/17 GI panel positive for norovirus.  Continues to have profuse diarrhea 2/18 diarrhea slowing down no other complaints 2/19 +orthostatics today. No diarrhea today 2/20 +orthostatics >40 points  Consultants:    Procedures:   Antimicrobials:  Ceftriaxone and azithromycin   Subjective: Denies sob, dizziness, cp.    Objective: Vitals:   02/25/21 1732 02/25/21 2154 02/26/21 0457 02/26/21 1000  BP: (!) 167/72 (!) 176/68 (!) 159/57 (!) 168/63  Pulse: (!) 53 (!) 52 100 (!) 53  Resp: 18 17 14 18   Temp: 98.1 F (36.7 C) 97.8 F (36.6 C) 99.1 F (37.3 C) 98.1 F (36.7 C)  TempSrc: Oral Oral Oral Oral  SpO2: 99% 98% 97% 98%  Weight:      Height:        Intake/Output Summary (Last 24  hours) at 02/26/2021 1453 Last data filed at 02/26/2021 1113 Gross per 24 hour  Intake 2565.98 ml  Output 1 ml  Net 2564.98 ml   Filed Weights   02/21/21 0826 02/21/21 1844  Weight: 81.6 kg 84 kg    Examination: Calm, NAD Cta no w/r Reg s1/s2 no gallop Soft benign +bs No edema Aaoxox3  Mood and affect appropriate in current setting     Data Reviewed: I have personally reviewed following labs and imaging studies  CBC: Recent Labs  Lab 02/21/21 0811 02/21/21 0820 02/22/21 0224 02/23/21 0356  WBC 12.8*  --  10.1  --   HGB 14.8 15.3 11.4* 11.3*  HCT 43.3 45.0 33.0* 33.0*  MCV 95.8  --  95.4  --   PLT 174  --  138*  --    Basic Metabolic Panel: Recent Labs  Lab 02/21/21 0811 02/21/21 0820 02/22/21 0224 02/23/21 0356 02/24/21 0212  NA 137 136 136 138  --   K 4.5 4.8 4.1 4.0 4.0  CL 106 110 107 109  --   CO2 19*  --  23 23  --   GLUCOSE 151* 149* 119* 106*  --   BUN 49* 59* 45* 30*  --   CREATININE 2.21* 2.00* 1.52* 1.21  --   CALCIUM 8.7*  --  8.2* 8.0*  --    GFR: Estimated Creatinine Clearance: 47.1 mL/min (by C-G formula based on SCr of 1.21 mg/dL). Liver Function Tests: Recent Labs  Lab 02/21/21 0811  AST 27  ALT 16  ALKPHOS 52  BILITOT 0.8  PROT 5.9*  ALBUMIN 3.2*   No results for input(s): LIPASE, AMYLASE in the last 168 hours. No results for input(s): AMMONIA in the last 168 hours. Coagulation Profile: Recent Labs  Lab 02/21/21 0811  INR 1.2   Cardiac Enzymes: No results for input(s): CKTOTAL, CKMB, CKMBINDEX, TROPONINI in the last 168 hours. BNP (last 3 results) No results for input(s): PROBNP in the last 8760 hours. HbA1C: No results for input(s): HGBA1C in the last 72 hours. CBG: Recent Labs  Lab 02/21/21 0751  GLUCAP 134*   Lipid Profile: No results for input(s): CHOL, HDL, LDLCALC, TRIG, CHOLHDL, LDLDIRECT in the last 72 hours. Thyroid Function Tests: No results for input(s): TSH, T4TOTAL, FREET4, T3FREE, THYROIDAB in the  last 72 hours. Anemia Panel: No results for input(s): VITAMINB12, FOLATE, FERRITIN, TIBC, IRON, RETICCTPCT in the last 72 hours. Sepsis Labs: Recent Labs  Lab 02/21/21 4627 02/24/21 0212  LATICACIDVEN 2.8* 0.6    Recent Results (from the past 240 hour(s))  Resp Panel by RT-PCR (Flu A&B, Covid) Nasopharyngeal Swab     Status: None   Collection Time: 02/21/21  8:25 AM   Specimen: Nasopharyngeal Swab; Nasopharyngeal(NP) swabs in vial transport medium  Result Value Ref Range Status   SARS Coronavirus 2 by RT PCR NEGATIVE NEGATIVE Final    Comment: (NOTE) SARS-CoV-2 target nucleic acids are NOT DETECTED.  The SARS-CoV-2 RNA is generally detectable in upper respiratory specimens during the acute phase of infection. The lowest concentration of SARS-CoV-2 viral copies this assay can detect is 138 copies/mL. A negative result does not preclude SARS-Cov-2 infection and should not be used as the sole basis for treatment or other patient management decisions. A negative result may occur with  improper specimen collection/handling, submission of specimen other than nasopharyngeal swab, presence of viral mutation(s) within the areas targeted by this assay, and inadequate number of viral copies(<138 copies/mL). A negative result must be combined with clinical observations, patient history, and epidemiological information. The expected result is Negative.  Fact Sheet for Patients:  EntrepreneurPulse.com.au  Fact Sheet for Healthcare Providers:  IncredibleEmployment.be  This test is no t yet approved or cleared by the [place] FDA and  has been authorized for detection and/or diagnosis of SARS-CoV-2 by FDA under an Emergency Use Authorization (EUA). This EUA will remain  in effect (meaning this test can be used) for the duration of the COVID-19 declaration under Section 564(b)(1) of the Act, 21 U.S.C.section 360bbb-3(b)(1), unless the authorization  is terminated  or revoked sooner.       Influenza A by PCR NEGATIVE NEGATIVE Final   Influenza B by PCR NEGATIVE NEGATIVE Final    Comment: (NOTE) The Xpert Xpress SARS-CoV-2/FLU/RSV plus assay is intended as an aid in the diagnosis of influenza from Nasopharyngeal swab specimens and should not be used as a sole basis for treatment. Nasal washings and aspirates are unacceptable for Xpert Xpress SARS-CoV-2/FLU/RSV testing.  Fact Sheet for Patients: EntrepreneurPulse.com.au  Fact Sheet for Healthcare Providers: IncredibleEmployment.be  This test is not yet approved or cleared by the [place] FDA and has been authorized for detection and/or diagnosis of SARS-CoV-2 by FDA under an Emergency Use Authorization (EUA). This EUA will remain in effect (meaning this test can be used)  for the duration of the COVID-19 declaration under Section 564(b)(1) of the Act, 21 U.S.C. section 360bbb-3(b)(1), unless the authorization is terminated or revoked.  Performed at [facility], [place] [address], [place], [place] [address]   Gastrointestinal Panel by PCR , Stool     Status: Abnormal   Collection Time: 02/22/21  5:17 PM   Specimen: Stool  Result Value Ref Range Status   Campylobacter species NOT DETECTED NOT DETECTED Final   Plesimonas shigelloides NOT DETECTED NOT DETECTED Final   Salmonella species NOT DETECTED NOT DETECTED Final   Yersinia enterocolitica NOT DETECTED NOT DETECTED Final   Vibrio species NOT DETECTED NOT DETECTED Final   Vibrio cholerae NOT DETECTED NOT DETECTED Final   Enteroaggregative E coli (EAEC) NOT DETECTED NOT DETECTED Final   Enteropathogenic E coli (EPEC) NOT DETECTED NOT DETECTED Final   Enterotoxigenic E coli (ETEC) NOT DETECTED NOT DETECTED Final   Shiga like toxin producing E coli (STEC) NOT DETECTED NOT DETECTED Final   Shigella/Enteroinvasive E coli (EIEC) NOT DETECTED NOT DETECTED Final   Cryptosporidium NOT  DETECTED NOT DETECTED Final   Cyclospora cayetanensis NOT DETECTED NOT DETECTED Final   Entamoeba histolytica NOT DETECTED NOT DETECTED Final   Giardia lamblia NOT DETECTED NOT DETECTED Final   Adenovirus F40/41 NOT DETECTED NOT DETECTED Final   Astrovirus NOT DETECTED NOT DETECTED Final   Norovirus GI/GII DETECTED (A) NOT DETECTED Final    Comment: RESULT CALLED TO, READ BACK BY AND VERIFIED WITH: CANDICE APPLEWHITE @1315  02/23/21 MJU    Rotavirus A NOT DETECTED NOT DETECTED Final   Sapovirus (I, II, IV, and V) NOT DETECTED NOT DETECTED Final    Comment: Performed at [facility], [address] [place], [place] [address]         Radiology Studies: No results found.      Scheduled Meds:  acetaminophen  1,000 mg Oral QHS   acidophilus  1 capsule Oral TID   amLODipine  5 mg Oral Daily   calcium carbonate  1 tablet Oral Daily   clopidogrel  75 mg Oral Daily   enoxaparin (LOVENOX) injection  40 mg Subcutaneous Q24H   levothyroxine  125 mcg Oral Q0600   meclizine  50 mg Oral TID   melatonin  3 mg Oral QHS   pantoprazole  40 mg Oral Daily   rosuvastatin  10 mg Oral Daily   sodium chloride flush  3 mL Intravenous Q12H   Continuous Infusions:  sodium chloride 50 mL/hr at 02/26/21 1112    Assessment & Plan:   Principal Problem:   Syncope and collapse Active Problems:   Hypothyroidism   Essential hypertension   Acute kidney injury (HCC)   Nausea vomiting and diarrhea   Abnormal chest x-ray   DNR (do not resuscitate)   Dyslipidemia   Syncope and collapse- (present on admission) Due to hypovolemia from diarrhea 2/20 continue ivf gentle hydration for orthostatics Wear ECS     Vertigo Improved with meclizine   PNA On ct scan On antibiotics to complete 5-day course      Acute kidney injury ([place])- (present on admission) Due to prerenal 2/20 improved with IV fluids   Dyslipidemia- (present on admission) Continue Crestor        Acute  gastroenteritis Due to norovirus Continue supportive care/IV fluids        Essential hypertension- (present on admission) Due to hypovolemia and bp nml, will dc amlodipine for now   Hypothyroidism- (present on admission) -Continue  synthroid       DVT prophylaxis: Lovenox Code Status: DNR Family Communication: None at bedside Disposition Plan:  Status is: Inpatient The patient remains inpatient due IV treatment, has orthostatics.                LOS: 4 days   Time spent: 35 minutes     Nolberto Hanlon, MD [facility] Pager [phone]  If 7PM-7AM, please contact night-coverage 02/26/2021, 2:53 PM

## 2021-02-26 NOTE — Progress Notes (Signed)
Physical Therapy Treatment Patient Details Name: Walter [name] MRN: [id number] DOB: [birth year] Today's Date: 02/26/2021   History of Present Illness 88 y/o male presented to ED on 02/21/21 after fall x 2 with syncopal episode. Imaging negative. Orthostatics negative. Persistent dizziness with transitional movements. PMH: CKD, HTN, PVD, hypothyroidism    PT Comments    Patient reassessed for left posterior BPPV after testing positive on 2/19. Dix-Hallpike negative and pt denies any vertigo with rolling, supine <> sit, or sit to stand. Ambulating with supervision without dizziness. RN noted awaiting compression stockings due to pt's +orthostasis 2/19 and she plans to recheck orthostatic BPs once stockings have arrived.     Recommendations for follow up therapy are one component of a multi-disciplinary discharge planning process, led by the attending physician.  Recommendations may be updated based on patient status, additional functional criteria and insurance authorization.  Follow Up Recommendations  Home health PT     Assistance Recommended at Discharge PRN  Patient can return home with the following     Equipment Recommendations  None recommended by PT    Recommendations for Other Services       Precautions / Restrictions Precautions Precautions: Fall Precaution Comments: dizziness with transitional movements Restrictions Weight Bearing Restrictions: No    Vestibular Assessment  02/26/21 0857  Positional Testing  Dix-Hallpike Dix-Hallpike Left  Dix-Hallpike Left  Dix-Hallpike Left Duration 0  Dix-Hallpike Left Symptoms No nystagmus   Mobility  Bed Mobility Overal bed mobility: Modified Independent             General bed mobility comments: into bed    Transfers Overall transfer level: Needs assistance Equipment used: Rolling walker (2 wheels) Transfers: Sit to/from Stand Sit to Stand: Supervision           General transfer comment: supervision for  safety. Cues for hand placement.    Ambulation/Gait Ambulation/Gait assistance: Supervision Gait Distance (Feet): 12 Feet Assistive device: Rolling walker (2 wheels) Gait Pattern/deviations: Step-through pattern, Decreased stride length, Trunk flexed Gait velocity: decreased     General Gait Details: denied dizziness;   Stairs             Wheelchair Mobility    Modified Rankin (Stroke Patients Only)       Balance Overall balance assessment: Needs assistance Sitting-balance support: No upper extremity supported, Feet supported Sitting balance-Leahy Scale: Good     Standing balance support: No upper extremity supported Standing balance-Leahy Scale: Fair                              Cognition Arousal/Alertness: Awake/alert Behavior During Therapy: WFL for tasks assessed/performed Overall Cognitive Status: Within Functional Limits for tasks assessed                                          Exercises      General Comments        Pertinent Vitals/Pain      Home Living                          Prior Function            PT Goals (current goals can now be found in the care plan section) Acute Rehab PT Goals Patient Stated Goal: to get rid of the dizziness Time For  Goal Achievement: 03/10/21 Potential to Achieve Goals: Good Progress towards PT goals: Progressing toward goals    Frequency    Min 3X/week      PT Plan Current plan remains appropriate    Co-evaluation              AM-PAC PT "6 Clicks" Mobility   Outcome Measure  Help needed turning from your back to your side while in a flat bed without using bedrails?: None Help needed moving from lying on your back to sitting on the side of a flat bed without using bedrails?: None Help needed moving to and from a bed to a chair (including a wheelchair)?: A Little Help needed standing up from a chair using your arms (e.g., wheelchair or bedside  chair)?: A Little Help needed to walk in hospital room?: A Little Help needed climbing 3-5 steps with a railing? : A Little 6 Click Score: 20    End of Session   Activity Tolerance: Patient tolerated treatment well Patient left: with call bell/phone within reach;in bed;with bed alarm set Nurse Communication: Mobility status PT Visit Diagnosis: Unsteadiness on feet (R26.81);Muscle weakness (generalized) (M62.81);Dizziness and giddiness (R42);BPPV BPPV - Right/Left : Left     Time: 8469-6295 PT Time Calculation (min) (ACUTE ONLY): 13 min  Charges:  $Therapeutic Activity: 8-22 mins                      Arby Barrette, PT Acute Rehabilitation Services  Pager [phone] Office [phone]    Rexanne Mano 02/26/2021, 9:01 AM

## 2021-02-26 NOTE — TOC Progression Note (Addendum)
Transition of Care [facility]) - Progression Note    Patient Details  Name: Walter [name] MRN: [id number] Date of Birth: 01/27/1933  [facility] CM/SW Contact  Tom-Johnson, Renea Ee, RN Phone Number: 02/26/2021, 2:47 PM  Clinical Narrative:     CM spoke with patient about home health. List of agencies from Medicare.gov given to patient and he chose [place]. Referral made with [facility] with acceptance voiced. Information on AVS. Tentative d/c home today but patient is positive for orthostatic  hypotension. CM will continue to follow with needs.    Expected Discharge Plan: [place] Barriers to Discharge: Continued Medical Work up  Expected Discharge Plan and Services Expected Discharge Plan: [place]   Discharge Planning Services: CM Consult Post Acute Care Choice: [place] arrangements for the past 2 months: Single Family Home Expected Discharge Date: 02/26/21               DME Arranged: N/A DME Agency: NA       HH Arranged: PT HH Agency: [place] Date [place]: 02/26/21 Time [place]: 9470 Representative spoke with at [place]: [place]) Interventions    Readmission Risk Interventions No flowsheet data found.

## 2021-02-26 NOTE — Plan of Care (Signed)
°  Problem: Education: Goal: Knowledge of condition and prescribed therapy will improve Outcome: Progressing   

## 2021-02-27 MED ORDER — MIDODRINE HCL 5 MG PO TABS
2.5000 mg | ORAL_TABLET | Freq: Two times a day (BID) | ORAL | Status: DC
  Administered 2021-02-27 – 2021-02-28 (×2): 2.5 mg via ORAL
  Filled 2021-02-27 (×2): qty 1

## 2021-02-27 NOTE — Progress Notes (Signed)
PROGRESS NOTE    Walter [name]  GNF:[id number] DOB: 03-25-1933 DOA: 02/21/2021 PCP: Alroy Dust, L.Marlou Sa, MD    Brief Narrative:  Walter [name] is a 88 y.o. male with medical history significant of stage 3 CKD; HTN; HLD; PAD; and hypothyroidism presenting with syncope. He started with n/v/d yesterday afternoon.  He was increasingly sick.  He laid down but symptoms continued.  He finally went to sleep and woke up terribly thirsty.  He started walking down the hall and things started moving and he fell, hitting his head against the wall.  He did lose consciousness, unsure how long he was down.  He was unable to get up, but reached some water.  He dragged himself to the couch and managed to get himself up onto the couch and again went to sleep for uncertain period of time.  He went to get more water with a walker.  He reached for a cup and doesn't remember anything until he woke up in the floor.  He phone was in another room and he eventually very slowly scooted himself to reach it.    He continued having diarrhea profusely which now has resolved.  GI panel positive for norovirus.  Now issue is he has profound orthostatic hypotension.  Giving gentle IV hydration.   felt dizzy with standing overnight.   2/16 with 2 episodes of diarrhea today 2/17 GI panel positive for norovirus.  Continues to have profuse diarrhea 2/18 diarrhea slowing down no other complaints 2/19 +orthostatics today. No diarrhea today 2/20 +orthostatics >40 points 2/21 +orthostatics  Consultants:    Procedures:   Antimicrobials:  Ceftriaxone and azithromycin-completed course   Subjective: While lying in bed no sob, cp.   Objective: Vitals:   02/26/21 2135 02/27/21 0520 02/27/21 0824 02/27/21 1006  BP: (!) 167/66 (!) 155/53 (!) 156/62 (!) 175/70  Pulse: (!) 52 (!) 44  (!) 57  Resp: 18 17  19   Temp: 98.9 F (37.2 C) 97.7 F (36.5 C)  (!) 97.5 F (36.4 C)  TempSrc: Oral Oral  Oral  SpO2: 94% 96%  94%  Weight:       Height:        Intake/Output Summary (Last 24 hours) at 02/27/2021 1621 Last data filed at 02/27/2021 1300 Gross per 24 hour  Intake 1965.62 ml  Output 0 ml  Net 1965.62 ml   Filed Weights   02/21/21 0826 02/21/21 1844  Weight: 81.6 kg 84 kg    Examination: Calm, NAD Cta no w/r Reg s1/s2 no gallop Soft benign +bs No edema Aaoxox3  Mood and affect appropriate in current setting     Data Reviewed: I have personally reviewed following labs and imaging studies  CBC: Recent Labs  Lab 02/21/21 0811 02/21/21 0820 02/22/21 0224 02/23/21 0356  WBC 12.8*  --  10.1  --   HGB 14.8 15.3 11.4* 11.3*  HCT 43.3 45.0 33.0* 33.0*  MCV 95.8  --  95.4  --   PLT 174  --  138*  --    Basic Metabolic Panel: Recent Labs  Lab 02/21/21 0811 02/21/21 0820 02/22/21 0224 02/23/21 0356 02/24/21 0212  NA 137 136 136 138  --   K 4.5 4.8 4.1 4.0 4.0  CL 106 110 107 109  --   CO2 19*  --  23 23  --   GLUCOSE 151* 149* 119* 106*  --   BUN 49* 59* 45* 30*  --   CREATININE 2.21* 2.00* 1.52* 1.21  --  CALCIUM 8.7*  --  8.2* 8.0*  --    GFR: Estimated Creatinine Clearance: 47.1 mL/min (by C-G formula based on SCr of 1.21 mg/dL). Liver Function Tests: Recent Labs  Lab 02/21/21 0811  AST 27  ALT 16  ALKPHOS 52  BILITOT 0.8  PROT 5.9*  ALBUMIN 3.2*   No results for input(s): LIPASE, AMYLASE in the last 168 hours. No results for input(s): AMMONIA in the last 168 hours. Coagulation Profile: Recent Labs  Lab 02/21/21 0811  INR 1.2   Cardiac Enzymes: No results for input(s): CKTOTAL, CKMB, CKMBINDEX, TROPONINI in the last 168 hours. BNP (last 3 results) No results for input(s): PROBNP in the last 8760 hours. HbA1C: No results for input(s): HGBA1C in the last 72 hours. CBG: Recent Labs  Lab 02/21/21 0751  GLUCAP 134*   Lipid Profile: No results for input(s): CHOL, HDL, LDLCALC, TRIG, CHOLHDL, LDLDIRECT in the last 72 hours. Thyroid Function Tests: No results for  input(s): TSH, T4TOTAL, FREET4, T3FREE, THYROIDAB in the last 72 hours. Anemia Panel: No results for input(s): VITAMINB12, FOLATE, FERRITIN, TIBC, IRON, RETICCTPCT in the last 72 hours. Sepsis Labs: Recent Labs  Lab 02/21/21 0240 02/24/21 0212  LATICACIDVEN 2.8* 0.6    Recent Results (from the past 240 hour(s))  Resp Panel by RT-PCR (Flu A&B, Covid) Nasopharyngeal Swab     Status: None   Collection Time: 02/21/21  8:25 AM   Specimen: Nasopharyngeal Swab; Nasopharyngeal(NP) swabs in vial transport medium  Result Value Ref Range Status   SARS Coronavirus 2 by RT PCR NEGATIVE NEGATIVE Final    Comment: (NOTE) SARS-CoV-2 target nucleic acids are NOT DETECTED.  The SARS-CoV-2 RNA is generally detectable in upper respiratory specimens during the acute phase of infection. The lowest concentration of SARS-CoV-2 viral copies this assay can detect is 138 copies/mL. A negative result does not preclude SARS-Cov-2 infection and should not be used as the sole basis for treatment or other patient management decisions. A negative result may occur with  improper specimen collection/handling, submission of specimen other than nasopharyngeal swab, presence of viral mutation(s) within the areas targeted by this assay, and inadequate number of viral copies(<138 copies/mL). A negative result must be combined with clinical observations, patient history, and epidemiological information. The expected result is Negative.  Fact Sheet for Patients:  EntrepreneurPulse.com.au  Fact Sheet for Healthcare Providers:  IncredibleEmployment.be  This test is no t yet approved or cleared by the [place] FDA and  has been authorized for detection and/or diagnosis of SARS-CoV-2 by FDA under an Emergency Use Authorization (EUA). This EUA will remain  in effect (meaning this test can be used) for the duration of the COVID-19 declaration under Section 564(b)(1) of the Act,  21 U.S.C.section 360bbb-3(b)(1), unless the authorization is terminated  or revoked sooner.       Influenza A by PCR NEGATIVE NEGATIVE Final   Influenza B by PCR NEGATIVE NEGATIVE Final    Comment: (NOTE) The Xpert Xpress SARS-CoV-2/FLU/RSV plus assay is intended as an aid in the diagnosis of influenza from Nasopharyngeal swab specimens and should not be used as a sole basis for treatment. Nasal washings and aspirates are unacceptable for Xpert Xpress SARS-CoV-2/FLU/RSV testing.  Fact Sheet for Patients: EntrepreneurPulse.com.au  Fact Sheet for Healthcare Providers: IncredibleEmployment.be  This test is not yet approved or cleared by the [place] FDA and has been authorized for detection and/or diagnosis of SARS-CoV-2 by FDA under an Emergency Use Authorization (EUA). This EUA will remain in effect (meaning  this test can be used) for the duration of the COVID-19 declaration under Section 564(b)(1) of the Act, 21 U.S.C. section 360bbb-3(b)(1), unless the authorization is terminated or revoked.  Performed at [facility], [place] [address] [place], [place] [address]   Gastrointestinal Panel by PCR , Stool     Status: Abnormal   Collection Time: 02/22/21  5:17 PM   Specimen: Stool  Result Value Ref Range Status   Campylobacter species NOT DETECTED NOT DETECTED Final   Plesimonas shigelloides NOT DETECTED NOT DETECTED Final   Salmonella species NOT DETECTED NOT DETECTED Final   Yersinia enterocolitica NOT DETECTED NOT DETECTED Final   Vibrio species NOT DETECTED NOT DETECTED Final   Vibrio cholerae NOT DETECTED NOT DETECTED Final   Enteroaggregative E coli (EAEC) NOT DETECTED NOT DETECTED Final   Enteropathogenic E coli (EPEC) NOT DETECTED NOT DETECTED Final   Enterotoxigenic E coli (ETEC) NOT DETECTED NOT DETECTED Final   Shiga like toxin producing E coli (STEC) NOT DETECTED NOT DETECTED Final   Shigella/Enteroinvasive E coli  (EIEC) NOT DETECTED NOT DETECTED Final   Cryptosporidium NOT DETECTED NOT DETECTED Final   Cyclospora cayetanensis NOT DETECTED NOT DETECTED Final   Entamoeba histolytica NOT DETECTED NOT DETECTED Final   Giardia lamblia NOT DETECTED NOT DETECTED Final   Adenovirus F40/41 NOT DETECTED NOT DETECTED Final   Astrovirus NOT DETECTED NOT DETECTED Final   Norovirus GI/GII DETECTED (A) NOT DETECTED Final    Comment: RESULT CALLED TO, READ BACK BY AND VERIFIED WITH: CANDICE APPLEWHITE @1315  02/23/21 MJU    Rotavirus A NOT DETECTED NOT DETECTED Final   Sapovirus (I, II, IV, and V) NOT DETECTED NOT DETECTED Final    Comment: Performed at [facility], [address], [place], [place] [address]         Radiology Studies: No results found.      Scheduled Meds:  acetaminophen  1,000 mg Oral QHS   acidophilus  1 capsule Oral TID   amLODipine  5 mg Oral Daily   calcium carbonate  1 tablet Oral Daily   clopidogrel  75 mg Oral Daily   enoxaparin (LOVENOX) injection  40 mg Subcutaneous Q24H   levothyroxine  125 mcg Oral Q0600   meclizine  50 mg Oral TID   melatonin  3 mg Oral QHS   midodrine  2.5 mg Oral BID WC   pantoprazole  40 mg Oral Daily   rosuvastatin  10 mg Oral Daily   sodium chloride flush  3 mL Intravenous Q12H   Continuous Infusions:  sodium chloride 50 mL/hr at 02/26/21 1112    Assessment & Plan:   Principal Problem:   Syncope and collapse Active Problems:   Hypothyroidism   Essential hypertension   Acute kidney injury (HCC)   Nausea vomiting and diarrhea   Abnormal chest x-ray   DNR (do not resuscitate)   Dyslipidemia   Syncope and collapse- (present on admission) Due to hypovolemia from diarrhea 2/21 still with orthostasis , >50 points sbp drop Continue ECS 20 to 30 mmHg  Continue gentle hydration  Add midodrine low-dose twice daily, may need to adjust blood pressure medications accordingly       Vertigo Improved with  meclizine   PNA On ct scan 2/21 completed 5-day antibiotics course      Acute kidney injury ([place])- (present on admission) Due to prerenal Somewhat improved with IV fluids   Dyslipidemia- (present on admission) Continue Crestor  Acute gastroenteritis Due to norovirus Now resolved        Essential hypertension- (present on admission) On amlodipine     Hypothyroidism- (present on admission) -Continue synthroid       DVT prophylaxis: Lovenox, SCD Code Status: DNR Family Communication: None at bedside Disposition Plan:  Status is: Inpatient The patient remains inpatient due IV treatment, has orthostatics.                LOS: 5 days   Time spent: 35 minutes     Nolberto Hanlon, MD [facility] Pager [phone]  If 7PM-7AM, please contact night-coverage 02/27/2021, 4:21 PM

## 2021-02-27 NOTE — Plan of Care (Signed)
  Problem: Clinical Measurements: Goal: Ability to maintain clinical measurements within normal limits will improve Outcome: Progressing   

## 2021-02-28 MED ORDER — MIDODRINE HCL 2.5 MG PO TABS: 2.5000 mg | ORAL_TABLET | Freq: Two times a day (BID) | ORAL | 0 refills | Status: DC

## 2021-02-28 NOTE — Progress Notes (Signed)
DISCHARGE NOTE HOME MAURIZIO GENO to be discharged Home per MD order. Discussed prescriptions and follow up appointments with the patient. Prescriptions given to patient; medication list explained in detail. Patient verbalized understanding.  Skin clean, dry and intact without evidence of skin break down, no evidence of skin tears noted. IV catheter discontinued intact. Site without signs and symptoms of complications. Dressing and pressure applied. Pt denies pain at the site currently. No complaints noted.  Patient free of lines, drains, and wounds.   An After Visit Summary (AVS) was printed and given to the patient. Patient escorted via wheelchair, and discharged home via private auto.  Callan Norden S Karmon Andis, RN

## 2021-02-28 NOTE — Progress Notes (Signed)
Physical Therapy Treatment Patient Details Name: Walter [name] MRN: [id number] DOB: 27-Aug-1933 Today's Date: 02/28/2021   History of Present Illness 88 y/o male presented to ED on 02/21/21 after fall x 2 with syncopal episode. Imaging negative. Orthostatics negative. Persistent dizziness with transitional movements. PMH: CKD, HTN, PVD, hypothyroidism    PT Comments    Patient seen to assess orthostatics and general mobility, see below. Patient at [place] level for mobility with RW. Patient deferred ambulation due to recently being up performing ADLs. Patient states he will be using RW for mobility for short time period. Educated patient to monitor for symptoms of orthostasis at home and to monitor BP, patient verbalized understanding. D/c plan remains appropriate.   Orthostatic BPs  Sitting 129/76  Standing 108/47  Standing after 3 min 98/53   Standing after 5 mins 91/57      Recommendations for follow up therapy are one component of a multi-disciplinary discharge planning process, led by the attending physician.  Recommendations may be updated based on patient status, additional functional criteria and insurance authorization.  Follow Up Recommendations  Home health PT     Assistance Recommended at Discharge PRN  Patient can return home with the following Assistance with cooking/housework;Assist for transportation   Equipment Recommendations  None recommended by PT    Recommendations for Other Services       Precautions / Restrictions Precautions Precautions: Fall Restrictions Weight Bearing Restrictions: No     Mobility  Bed Mobility Overal bed mobility: Modified Independent                  Transfers Overall transfer level: Modified independent Equipment used: Rolling Mykaylah Ballman (2 wheels)                    Ambulation/Gait             Pre-gait activities: standing marching due to orthostatics     Stairs             Wheelchair  Mobility    Modified Rankin (Stroke Patients Only)       Balance Overall balance assessment: Needs assistance Sitting-balance support: No upper extremity supported, Feet supported Sitting balance-Leahy Scale: Good     Standing balance support: No upper extremity supported Standing balance-Leahy Scale: Fair                              Cognition Arousal/Alertness: Awake/alert Behavior During Therapy: WFL for tasks assessed/performed Overall Cognitive Status: Within Functional Limits for tasks assessed                                          Exercises      General Comments        Pertinent Vitals/Pain Pain Assessment Pain Assessment: No/denies pain    Home Living                          Prior Function            PT Goals (current goals can now be found in the care plan section) Acute Rehab PT Goals Patient Stated Goal: to go home now that dizziness is gone PT Goal Formulation: With patient Time For Goal Achievement: 03/10/21 Potential to Achieve Goals: Good Progress towards PT goals: Progressing toward goals  Frequency    Min 3X/week      PT Plan Current plan remains appropriate    Co-evaluation              AM-PAC PT "6 Clicks" Mobility   Outcome Measure  Help needed turning from your back to your side while in a flat bed without using bedrails?: None Help needed moving from lying on your back to sitting on the side of a flat bed without using bedrails?: None Help needed moving to and from a bed to a chair (including a wheelchair)?: None Help needed standing up from a chair using your arms (e.g., wheelchair or bedside chair)?: None Help needed to walk in hospital room?: A Little Help needed climbing 3-5 steps with a railing? : A Little 6 Click Score: 22    End of Session   Activity Tolerance: Patient tolerated treatment well Patient left: in bed;with call bell/phone within reach Nurse  Communication: Mobility status PT Visit Diagnosis: Unsteadiness on feet (R26.81);Muscle weakness (generalized) (M62.81);Dizziness and giddiness (R42);BPPV     Time: 0175-1025 PT Time Calculation (min) (ACUTE ONLY): 24 min  Charges:  $Therapeutic Activity: 23-37 mins                     Kelda Azad A. Gilford Rile PT, DPT Acute Rehabilitation Services Pager [phone] Office [phone]    Linna Hoff 02/28/2021, 12:15 PM

## 2021-02-28 NOTE — Discharge Summary (Signed)
Physician Discharge Summary  Walter [name] ZOX:[id number] DOB: 05-21-1933 DOA: 02/21/2021  PCP: Alroy Dust, L.Marlou Sa, MD  Admit date: 02/21/2021 Discharge date: 02/28/2021  Admitted From: Home Disposition: Home  Recommendations for Outpatient Follow-up:  Follow up with PCP in 1-2 weeks Please obtain BMP/CBC in one week  Home Health: PT OT Equipment/Devices: None  Discharge Condition: Stable CODE STATUS: DNR Diet recommendation: Low-salt low-fat diet  Brief/Interim Summary: Walter [name] is a 88 y.o. male with medical history significant of stage 3 CKD; HTN; HLD; PAD; and hypothyroidism presenting with syncope. He started with n/v/d yesterday afternoon. He was increasingly sick; ultimately while trying to ambulate had syncopal event.  In the hospital patient had profoundly positive orthostatics with more than 50 point drop from systolic blood pressures from supine to standing with profound symptoms.  With IV fluids, additional midodrine and discontinuation of home amlodipine and supportive care patient symptoms improved drastically over the past 48 hours.  Patient's stool tested positive for norovirus, patient's diarrhea markedly improved over the last few days essentially resolved at this point. Patient now ambulating without any further signs of orthostatic symptoms, blood pressure remains moderately stable, orthostatics negative this morning.  Questionable pneumonia on imaging, completed antibiotics during hospitalization.  AKI in the setting of dehydration as above also resolved.  Patient otherwise stable and agreeable for discharge, follow with PCP as scheduled in the next 1 to 2 weeks for repeat evaluation and testing, may need to wean off midodrine if blood pressures continue to improve with increased p.o. intake.  2/16 with 2 episodes of diarrhea today 2/17 GI panel positive for norovirus.  Continues to have profuse diarrhea 2/18 diarrhea slowing down no other complaints 2/19  +orthostatics today. No diarrhea today 2/20 +orthostatics >40 points 2/21 +orthostatics -but improving on midodrine   Discharge Diagnoses:  Principal Problem:   Syncope and collapse Active Problems:   Hypothyroidism   Essential hypertension   Acute kidney injury (HCC)   Nausea vomiting and diarrhea   Abnormal chest x-ray   DNR (do not resuscitate)   Dyslipidemia   Discharge Instructions  Discharge Instructions     Call MD for:  difficulty breathing, headache or visual disturbances   Complete by: As directed    Call MD for:  persistant dizziness or light-headedness   Complete by: As directed    Diet - low sodium heart healthy   Complete by: As directed    Discharge instructions   Complete by: As directed    F/u with pcp  in few days   Discharge instructions   Complete by: As directed    hydrate   Discharge patient   Complete by: As directed    Discharge disposition: 01-Home or Self Care   Discharge patient date: 02/28/2021   Increase activity slowly   Complete by: As directed    Increase activity slowly   Complete by: As directed       Allergies as of 02/28/2021       Reactions   Penicillins Other (See Comments)   Boils        Medication List     STOP taking these medications    amLODipine 5 MG tablet Commonly known as: NORVASC       TAKE these medications    acetaminophen 500 MG tablet Commonly known as: TYLENOL Take 1,000 mg by mouth at bedtime.   alum & mag hydroxide-simeth 200-200-20 MG/5ML suspension Commonly known as: MAALOX/MYLANTA Take 15 mLs by mouth daily.   clopidogrel 75  MG tablet Commonly known as: PLAVIX Take 1 tablet (75 mg total) by mouth daily.   feeding supplement Liqd Take 1 Container by mouth 2 (two) times daily between meals.   levothyroxine 125 MCG tablet Commonly known as: SYNTHROID Take 125 mcg by mouth at bedtime.   midodrine 2.5 MG tablet Commonly known as: PROAMATINE Take 1 tablet (2.5 mg total) by mouth 2  (two) times daily with a meal.   OMEGA 3-6-9 COMPLEX PO Take 2 capsules by mouth daily.   omeprazole 40 MG capsule Commonly known as: PRILOSEC TAKE 1 CAPSULE BY MOUTH  DAILY What changed: how to take this   PROBIOTIC PO Take by mouth daily. Colon support OTC   rosuvastatin 10 MG tablet Commonly known as: CRESTOR Take 10 mg by mouth daily.        Follow-up Information     Care, [facility] Follow up.   Specialty: Home Health Services Why: Someone will call to schedule first home visit. Contact information: [address] [place] [address] [phone]                Allergies  Allergen Reactions   Penicillins Other (See Comments)    Boils    Consultations: None  Procedures/Studies: CT Head Wo Contrast  Result Date: 02/21/2021 CLINICAL DATA:  Head trauma, minor (Age >= 65y); Neck trauma (Age >= 65y) EXAM: CT HEAD WITHOUT CONTRAST CT CERVICAL SPINE WITHOUT CONTRAST TECHNIQUE: Multidetector CT imaging of the head and cervical spine was performed following the standard protocol without intravenous contrast. Multiplanar CT image reconstructions of the cervical spine were also generated. RADIATION DOSE REDUCTION: This exam was performed according to the departmental dose-optimization program which includes automated exposure control, adjustment of the mA and/or kV according to patient size and/or use of iterative reconstruction technique. COMPARISON:  Head CT 09/16/2005 FINDINGS: CT HEAD FINDINGS Brain: No evidence of acute intracranial hemorrhage or extra-axial collection.No evidence of mass lesion/concern mass effect.The ventricles are normal in size.Confluent periventricular and subcortical white matter hypoattenuation, which is nonspecific but likely sequela of chronic small vessel ischemic disease. Vascular: No hyperdense vessel.  Scattered vascular calcifications. Skull: Normal. Negative for fracture or focal lesion. Sinuses/Orbits: Minimal  paranasal sinus mucosal thickening. Other: None. CT CERVICAL SPINE FINDINGS Alignment: Trace anterolisthesis at C7-T1. Skull base and vertebrae: No acute fracture. No primary bone lesion or focal pathologic process. Soft tissues and spinal canal: No prevertebral fluid or swelling. No visible canal hematoma. Disc levels: There is multilevel degenerative disc disease, moderate to severe at C3-C4, C5-C6, and C6-C7. There is mild to moderate bilateral facet arthropathy. Multiple posterior disc osteophyte complexes resulting and likely varying degrees of mild to moderate spinal canal or neural foraminal stenosis. Upper chest: Negative. Other: None. IMPRESSION: No acute intracranial abnormality. Sequelae of chronic small vessel ischemic disease. No acute cervical spine fracture. Multilevel degenerative disc disease and facet arthropathy. Electronically Signed   By: Maurine Simmering M.D.   On: 02/21/2021 08:33   CT Cervical Spine Wo Contrast  Result Date: 02/21/2021 CLINICAL DATA:  Head trauma, minor (Age >= 65y); Neck trauma (Age >= 65y) EXAM: CT HEAD WITHOUT CONTRAST CT CERVICAL SPINE WITHOUT CONTRAST TECHNIQUE: Multidetector CT imaging of the head and cervical spine was performed following the standard protocol without intravenous contrast. Multiplanar CT image reconstructions of the cervical spine were also generated. RADIATION DOSE REDUCTION: This exam was performed according to the departmental dose-optimization program which includes automated exposure control, adjustment of the mA and/or kV  according to patient size and/or use of iterative reconstruction technique. COMPARISON:  Head CT 09/16/2005 FINDINGS: CT HEAD FINDINGS Brain: No evidence of acute intracranial hemorrhage or extra-axial collection.No evidence of mass lesion/concern mass effect.The ventricles are normal in size.Confluent periventricular and subcortical white matter hypoattenuation, which is nonspecific but likely sequela of chronic small vessel  ischemic disease. Vascular: No hyperdense vessel.  Scattered vascular calcifications. Skull: Normal. Negative for fracture or focal lesion. Sinuses/Orbits: Minimal paranasal sinus mucosal thickening. Other: None. CT CERVICAL SPINE FINDINGS Alignment: Trace anterolisthesis at C7-T1. Skull base and vertebrae: No acute fracture. No primary bone lesion or focal pathologic process. Soft tissues and spinal canal: No prevertebral fluid or swelling. No visible canal hematoma. Disc levels: There is multilevel degenerative disc disease, moderate to severe at C3-C4, C5-C6, and C6-C7. There is mild to moderate bilateral facet arthropathy. Multiple posterior disc osteophyte complexes resulting and likely varying degrees of mild to moderate spinal canal or neural foraminal stenosis. Upper chest: Negative. Other: None. IMPRESSION: No acute intracranial abnormality. Sequelae of chronic small vessel ischemic disease. No acute cervical spine fracture. Multilevel degenerative disc disease and facet arthropathy. Electronically Signed   By: Maurine Simmering M.D.   On: 02/21/2021 08:33   CT Thoracic Spine Wo Contrast  Addendum Date: 02/21/2021   ADDENDUM REPORT: 02/21/2021 09:26 ADDENDUM: There is multifocal airspace disease in the lower lungs, right middle lobe, and lingula consistent with multifocal pneumonia. Opacities in the right middle lobe account for the rounded opacity noted on separately dictated chest radiograph. Recommend follow-up chest CT and 6-12 weeks to ensure resolution. These results were discussed with ED provider on 02/21/2021 at 9:10 am, who verbally acknowledged these results. Electronically Signed   By: Maurine Simmering M.D.   On: 02/21/2021 09:26   Result Date: 02/21/2021 CLINICAL DATA:  Spine fracture, thoracic, traumatic EXAM: CT THORACIC SPINE WITHOUT CONTRAST TECHNIQUE: Multidetector CT images of the thoracic were obtained using the standard protocol without intravenous contrast. RADIATION DOSE REDUCTION: This  exam was performed according to the departmental dose-optimization program which includes automated exposure control, adjustment of the mA and/or kV according to patient size and/or use of iterative reconstruction technique. COMPARISON:  None. FINDINGS: Alignment: Normal Vertebrae: No acute fracture.  No aggressive osseous lesion. Paraspinal and other soft tissues: Bibasilar airspace opacities slightly obscured by respiratory motion artifact. Thoracic aortic atherosclerotic calcifications. Disc levels: Mild multilevel degenerative changes. No visible impingement. IMPRESSION: No acute thoracic spine fracture. Mild multilevel degenerative changes. Bilateral lower lung airspace opacities, concerning for pneumonia. Electronically Signed: By: Maurine Simmering M.D. On: 02/21/2021 08:42   CT Lumbar Spine Wo Contrast  Result Date: 02/21/2021 CLINICAL DATA:  Trauma, lower back pain EXAM: CT LUMBAR SPINE WITHOUT CONTRAST TECHNIQUE: Multidetector CT imaging of the lumbar spine was performed without intravenous contrast administration. Multiplanar CT image reconstructions were also generated. RADIATION DOSE REDUCTION: This exam was performed according to the departmental dose-optimization program which includes automated exposure control, adjustment of the mA and/or kV according to patient size and/or use of iterative reconstruction technique. COMPARISON:  CT 05/27/2014 FINDINGS: Segmentation: 5 lumbar type vertebrae. Alignment: Mild retrolisthesis at L3-L4 and L4-L5. Vertebrae: There is no acute lumbar spine fracture. No aggressive osseous lesion. Paraspinal and other soft tissues: Aortoiliac atherosclerotic calcifications. No AAA. Sigmoid diverticulosis. There are bibasilar airspace opacities. Disc levels: There is mild multilevel degenerative disc disease with disc bulging and bilateral facet arthropathy resulting in varying degrees of spinal canal neural foraminal narrowing. Moderate-severe left-sided neural foraminal  stenosis at L4-L5  due to bony spurring and disc bulging. IMPRESSION: No acute lumbar spine fracture. Multilevel degenerative disc disease and facet arthropathy with probable moderate-severe left-sided neural foraminal stenosis at L4-L5. Bibasilar airspace opacities could be developing infection. Electronically Signed   By: Maurine Simmering M.D.   On: 02/21/2021 08:38   DG Pelvis Portable  Result Date: 02/21/2021 CLINICAL DATA:  Trauma EXAM: PORTABLE PELVIS 1-2 VIEWS COMPARISON:  CT 05/27/2014 FINDINGS: There is no radiographically evident pelvic fracture or femoral neck fracture on single frontal view of the pelvis. There is moderate right and mild left hip osteoarthritis. Lower lumbar spine degenerative changes. IMPRESSION: No evidence of acute fracture on single frontal view of the pelvis. Electronically Signed   By: Maurine Simmering M.D.   On: 02/21/2021 08:00   DG Chest Portable 1 View  Result Date: 02/21/2021 CLINICAL DATA:  Trauma, fall EXAM: PORTABLE CHEST 1 VIEW COMPARISON:  Radiograph 11/24/2012 FINDINGS: The cardiomediastinal silhouette is within normal limits. There is a rounded right lateral mid lung opacity. Faint left basilar opacities. No large pleural effusion. No visible pneumothorax. No acute fracture on single frontal view of the chest. IMPRESSION: Rounded right lateral mid lung opacity, could represent contusion, focal infection, or potentially a pulmonary nodule given rounded morphology. Faint left basilar opacities which could be atelectasis or infection. Recommend chest CT. Electronically Signed   By: Maurine Simmering M.D.   On: 02/21/2021 07:59     Subjective: No acute issues or events overnight denies nausea vomiting diarrhea constipation headache fevers chills or chest pain   Discharge Exam: Vitals:   02/28/21 0454 02/28/21 0853  BP: (!) 156/63 (!) 155/55  Pulse: (!) 44 (!) 53  Resp: 18 20  Temp: 97.7 F (36.5 C) 98.3 F (36.8 C)  SpO2: 95% 97%   Vitals:   02/27/21 1739 02/27/21  2044 02/28/21 0454 02/28/21 0853  BP: (!) 178/68 (!) 167/62 (!) 156/63 (!) 155/55  Pulse: (!) 53 (!) 56 (!) 44 (!) 53  Resp: 19 18 18 20   Temp: 98.2 F (36.8 C) 98.1 F (36.7 C) 97.7 F (36.5 C) 98.3 F (36.8 C)  TempSrc: Oral Oral Oral Oral  SpO2: 97% 97% 95% 97%  Weight:   85.3 kg   Height:        General: Pt is alert, awake, not in acute distress Cardiovascular: RRR, S1/S2 +, no rubs, no gallops Respiratory: CTA bilaterally, no wheezing, no rhonchi Abdominal: Soft, NT, ND, bowel sounds + Extremities: no edema, no cyanosis    The results of significant diagnostics from this hospitalization (including imaging, microbiology, ancillary and laboratory) are listed below for reference.     Microbiology: Recent Results (from the past 240 hour(s))  Resp Panel by RT-PCR (Flu A&B, Covid) Nasopharyngeal Swab     Status: None   Collection Time: 02/21/21  8:25 AM   Specimen: Nasopharyngeal Swab; Nasopharyngeal(NP) swabs in vial transport medium  Result Value Ref Range Status   SARS Coronavirus 2 by RT PCR NEGATIVE NEGATIVE Final    Comment: (NOTE) SARS-CoV-2 target nucleic acids are NOT DETECTED.  The SARS-CoV-2 RNA is generally detectable in upper respiratory specimens during the acute phase of infection. The lowest concentration of SARS-CoV-2 viral copies this assay can detect is 138 copies/mL. A negative result does not preclude SARS-Cov-2 infection and should not be used as the sole basis for treatment or other patient management decisions. A negative result may occur with  improper specimen collection/handling, submission of specimen other than nasopharyngeal swab, presence of viral  mutation(s) within the areas targeted by this assay, and inadequate number of viral copies(<138 copies/mL). A negative result must be combined with clinical observations, patient history, and epidemiological information. The expected result is Negative.  Fact Sheet for Patients:   EntrepreneurPulse.com.au  Fact Sheet for Healthcare Providers:  IncredibleEmployment.be  This test is no t yet approved or cleared by the [place] FDA and  has been authorized for detection and/or diagnosis of SARS-CoV-2 by FDA under an Emergency Use Authorization (EUA). This EUA will remain  in effect (meaning this test can be used) for the duration of the COVID-19 declaration under Section 564(b)(1) of the Act, 21 U.S.C.section 360bbb-3(b)(1), unless the authorization is terminated  or revoked sooner.       Influenza A by PCR NEGATIVE NEGATIVE Final   Influenza B by PCR NEGATIVE NEGATIVE Final    Comment: (NOTE) The Xpert Xpress SARS-CoV-2/FLU/RSV plus assay is intended as an aid in the diagnosis of influenza from Nasopharyngeal swab specimens and should not be used as a sole basis for treatment. Nasal washings and aspirates are unacceptable for Xpert Xpress SARS-CoV-2/FLU/RSV testing.  Fact Sheet for Patients: EntrepreneurPulse.com.au  Fact Sheet for Healthcare Providers: IncredibleEmployment.be  This test is not yet approved or cleared by the [place] FDA and has been authorized for detection and/or diagnosis of SARS-CoV-2 by FDA under an Emergency Use Authorization (EUA). This EUA will remain in effect (meaning this test can be used) for the duration of the COVID-19 declaration under Section 564(b)(1) of the Act, 21 U.S.C. section 360bbb-3(b)(1), unless the authorization is terminated or revoked.  Performed at [facility], [place] [address], [place], [place] [address]   Gastrointestinal Panel by PCR , Stool     Status: Abnormal   Collection Time: 02/22/21  5:17 PM   Specimen: Stool  Result Value Ref Range Status   Campylobacter species NOT DETECTED NOT DETECTED Final   Plesimonas shigelloides NOT DETECTED NOT DETECTED Final   Salmonella species NOT DETECTED NOT DETECTED Final    Yersinia enterocolitica NOT DETECTED NOT DETECTED Final   Vibrio species NOT DETECTED NOT DETECTED Final   Vibrio cholerae NOT DETECTED NOT DETECTED Final   Enteroaggregative E coli (EAEC) NOT DETECTED NOT DETECTED Final   Enteropathogenic E coli (EPEC) NOT DETECTED NOT DETECTED Final   Enterotoxigenic E coli (ETEC) NOT DETECTED NOT DETECTED Final   Shiga like toxin producing E coli (STEC) NOT DETECTED NOT DETECTED Final   Shigella/Enteroinvasive E coli (EIEC) NOT DETECTED NOT DETECTED Final   Cryptosporidium NOT DETECTED NOT DETECTED Final   Cyclospora cayetanensis NOT DETECTED NOT DETECTED Final   Entamoeba histolytica NOT DETECTED NOT DETECTED Final   Giardia lamblia NOT DETECTED NOT DETECTED Final   Adenovirus F40/41 NOT DETECTED NOT DETECTED Final   Astrovirus NOT DETECTED NOT DETECTED Final   Norovirus GI/GII DETECTED (A) NOT DETECTED Final    Comment: RESULT CALLED TO, READ BACK BY AND VERIFIED WITH: CANDICE APPLEWHITE @1315  02/23/21 MJU    Rotavirus A NOT DETECTED NOT DETECTED Final   Sapovirus (I, II, IV, and V) NOT DETECTED NOT DETECTED Final    Comment: Performed at [facility], [address] [place], [place] [address]     Labs: BNP (last 3 results) No results for input(s): BNP in the last 8760 hours. Basic Metabolic Panel: Recent Labs  Lab 02/22/21 0224 02/23/21 0356 02/24/21 0212  NA 136 138  --   K 4.1 4.0 4.0  CL 107 109  --  CO2 23 23  --   GLUCOSE 119* 106*  --   BUN 45* 30*  --   CREATININE 1.52* 1.21  --   CALCIUM 8.2* 8.0*  --    Liver Function Tests: No results for input(s): AST, ALT, ALKPHOS, BILITOT, PROT, ALBUMIN in the last 168 hours. No results for input(s): LIPASE, AMYLASE in the last 168 hours. No results for input(s): AMMONIA in the last 168 hours. CBC: Recent Labs  Lab 02/22/21 0224 02/23/21 0356  WBC 10.1  --   HGB 11.4* 11.3*  HCT 33.0* 33.0*  MCV 95.4  --   PLT 138*  --    Cardiac Enzymes: No results for input(s):  CKTOTAL, CKMB, CKMBINDEX, TROPONINI in the last 168 hours. BNP: Invalid input(s): POCBNP CBG: No results for input(s): GLUCAP in the last 168 hours. D-Dimer No results for input(s): DDIMER in the last 72 hours. Hgb A1c No results for input(s): HGBA1C in the last 72 hours. Lipid Profile No results for input(s): CHOL, HDL, LDLCALC, TRIG, CHOLHDL, LDLDIRECT in the last 72 hours. Thyroid function studies No results for input(s): TSH, T4TOTAL, T3FREE, THYROIDAB in the last 72 hours.  Invalid input(s): FREET3 Anemia work up No results for input(s): VITAMINB12, FOLATE, FERRITIN, TIBC, IRON, RETICCTPCT in the last 72 hours. Urinalysis    Component Value Date/Time   COLORURINE YELLOW 05/28/2014 0716   APPEARANCEUR CLEAR 05/28/2014 0716   LABSPEC >1.046 (H) 05/28/2014 0716   PHURINE 5.0 05/28/2014 0716   GLUCOSEU NEGATIVE 05/28/2014 0716   HGBUR NEGATIVE 05/28/2014 0716   BILIRUBINUR NEGATIVE 05/28/2014 0716   KETONESUR NEGATIVE 05/28/2014 0716   PROTEINUR NEGATIVE 05/28/2014 0716   UROBILINOGEN 0.2 05/28/2014 0716   NITRITE NEGATIVE 05/28/2014 0716   LEUKOCYTESUR NEGATIVE 05/28/2014 0716   Sepsis Labs Invalid input(s): PROCALCITONIN,  WBC,  LACTICIDVEN Microbiology Recent Results (from the past 240 hour(s))  Resp Panel by RT-PCR (Flu A&B, Covid) Nasopharyngeal Swab     Status: None   Collection Time: 02/21/21  8:25 AM   Specimen: Nasopharyngeal Swab; Nasopharyngeal(NP) swabs in vial transport medium  Result Value Ref Range Status   SARS Coronavirus 2 by RT PCR NEGATIVE NEGATIVE Final    Comment: (NOTE) SARS-CoV-2 target nucleic acids are NOT DETECTED.  The SARS-CoV-2 RNA is generally detectable in upper respiratory specimens during the acute phase of infection. The lowest concentration of SARS-CoV-2 viral copies this assay can detect is 138 copies/mL. A negative result does not preclude SARS-Cov-2 infection and should not be used as the sole basis for treatment or other  patient management decisions. A negative result may occur with  improper specimen collection/handling, submission of specimen other than nasopharyngeal swab, presence of viral mutation(s) within the areas targeted by this assay, and inadequate number of viral copies(<138 copies/mL). A negative result must be combined with clinical observations, patient history, and epidemiological information. The expected result is Negative.  Fact Sheet for Patients:  EntrepreneurPulse.com.au  Fact Sheet for Healthcare Providers:  IncredibleEmployment.be  This test is no t yet approved or cleared by the [place] FDA and  has been authorized for detection and/or diagnosis of SARS-CoV-2 by FDA under an Emergency Use Authorization (EUA). This EUA will remain  in effect (meaning this test can be used) for the duration of the COVID-19 declaration under Section 564(b)(1) of the Act, 21 U.S.C.section 360bbb-3(b)(1), unless the authorization is terminated  or revoked sooner.       Influenza A by PCR NEGATIVE NEGATIVE Final   Influenza B by PCR  NEGATIVE NEGATIVE Final    Comment: (NOTE) The Xpert Xpress SARS-CoV-2/FLU/RSV plus assay is intended as an aid in the diagnosis of influenza from Nasopharyngeal swab specimens and should not be used as a sole basis for treatment. Nasal washings and aspirates are unacceptable for Xpert Xpress SARS-CoV-2/FLU/RSV testing.  Fact Sheet for Patients: EntrepreneurPulse.com.au  Fact Sheet for Healthcare Providers: IncredibleEmployment.be  This test is not yet approved or cleared by the [place] FDA and has been authorized for detection and/or diagnosis of SARS-CoV-2 by FDA under an Emergency Use Authorization (EUA). This EUA will remain in effect (meaning this test can be used) for the duration of the COVID-19 declaration under Section 564(b)(1) of the Act, 21 U.S.C. section  360bbb-3(b)(1), unless the authorization is terminated or revoked.  Performed at [facility], [place] [address] [place], [place] [address]   Gastrointestinal Panel by PCR , Stool     Status: Abnormal   Collection Time: 02/22/21  5:17 PM   Specimen: Stool  Result Value Ref Range Status   Campylobacter species NOT DETECTED NOT DETECTED Final   Plesimonas shigelloides NOT DETECTED NOT DETECTED Final   Salmonella species NOT DETECTED NOT DETECTED Final   Yersinia enterocolitica NOT DETECTED NOT DETECTED Final   Vibrio species NOT DETECTED NOT DETECTED Final   Vibrio cholerae NOT DETECTED NOT DETECTED Final   Enteroaggregative E coli (EAEC) NOT DETECTED NOT DETECTED Final   Enteropathogenic E coli (EPEC) NOT DETECTED NOT DETECTED Final   Enterotoxigenic E coli (ETEC) NOT DETECTED NOT DETECTED Final   Shiga like toxin producing E coli (STEC) NOT DETECTED NOT DETECTED Final   Shigella/Enteroinvasive E coli (EIEC) NOT DETECTED NOT DETECTED Final   Cryptosporidium NOT DETECTED NOT DETECTED Final   Cyclospora cayetanensis NOT DETECTED NOT DETECTED Final   Entamoeba histolytica NOT DETECTED NOT DETECTED Final   Giardia lamblia NOT DETECTED NOT DETECTED Final   Adenovirus F40/41 NOT DETECTED NOT DETECTED Final   Astrovirus NOT DETECTED NOT DETECTED Final   Norovirus GI/GII DETECTED (A) NOT DETECTED Final    Comment: RESULT CALLED TO, READ BACK BY AND VERIFIED WITH: CANDICE APPLEWHITE @1315  02/23/21 MJU    Rotavirus A NOT DETECTED NOT DETECTED Final   Sapovirus (I, II, IV, and V) NOT DETECTED NOT DETECTED Final    Comment: Performed at [facility], [address], [place], [place] [address]     Time coordinating discharge: Over 30 minutes  SIGNED:   Little Ishikawa, DO [facility] 02/28/2021, 5:37 PM Pager   If 7PM-7AM, please contact night-coverage [email or web address]

## 2021-02-28 NOTE — TOC Transition Note (Signed)
Transition of Care [facility]) - CM/SW Discharge Note   Patient Details  Name: OSMANI KERSTEN MRN: [id number] Date of Birth: 1933-02-10  Transition of Care [facility]) CM/SW Contact:  Tom-Johnson, Renea Ee, RN Phone Number: 02/28/2021, 1:45 PM   Clinical Narrative:    Patient is scheduled for discharge today. HHPT referral with [facility] and info on [facility]. Denies any other needs. Family to transport at discharge. No further TOC needs noted.   Final next level of care: [place] Barriers to Discharge: Barriers Resolved   Patient Goals and CMS Choice Patient states their goals for this hospitalization and ongoing recovery are:: To return home. CMS Medicare.gov Compare Post Acute Care list provided to:: Patient Choice offered to / list presented to : Patient  Discharge Placement                Patient to be transferred to facility by: Family      Discharge Plan and Services   Discharge Planning Services: CM Consult Post Acute Care Choice: Home Health          DME Arranged: N/A DME Agency: NA       HH Arranged: PT HH Agency: [place] Date [place]: 02/26/21 Time [place]: [id number] Representative spoke with at [place]: Tommi Rumps  Social Determinants of Health ([place]) Interventions     Readmission Risk Interventions No flowsheet data found.

## 2021-03-08 ENCOUNTER — Other Ambulatory Visit: Payer: Self-pay | Admitting: Family Medicine

## 2021-03-08 DIAGNOSIS — R55 Syncope and collapse: Secondary | ICD-10-CM | POA: Diagnosis not present

## 2021-03-08 DIAGNOSIS — J189 Pneumonia, unspecified organism: Secondary | ICD-10-CM

## 2021-03-08 DIAGNOSIS — I951 Orthostatic hypotension: Secondary | ICD-10-CM | POA: Diagnosis not present

## 2021-03-08 DIAGNOSIS — N179 Acute kidney failure, unspecified: Secondary | ICD-10-CM | POA: Diagnosis not present

## 2021-03-08 DIAGNOSIS — N178 Other acute kidney failure: Secondary | ICD-10-CM | POA: Diagnosis not present

## 2021-04-09 DIAGNOSIS — I739 Peripheral vascular disease, unspecified: Secondary | ICD-10-CM | POA: Diagnosis not present

## 2021-04-09 DIAGNOSIS — I10 Essential (primary) hypertension: Secondary | ICD-10-CM | POA: Diagnosis not present

## 2021-04-09 DIAGNOSIS — I951 Orthostatic hypotension: Secondary | ICD-10-CM | POA: Diagnosis not present

## 2021-04-09 DIAGNOSIS — M150 Primary generalized (osteo)arthritis: Secondary | ICD-10-CM | POA: Diagnosis not present

## 2021-04-11 ENCOUNTER — Ambulatory Visit
Admission: RE | Admit: 2021-04-11 | Discharge: 2021-04-11 | Disposition: A | Discharge status: Home / Self Care | Payer: Medicare Other | Source: Ambulatory Visit | Attending: Family Medicine | Admitting: Family Medicine

## 2021-04-11 DIAGNOSIS — J8410 Pulmonary fibrosis, unspecified: Secondary | ICD-10-CM | POA: Diagnosis not present

## 2021-04-11 DIAGNOSIS — J189 Pneumonia, unspecified organism: Secondary | ICD-10-CM

## 2021-04-11 DIAGNOSIS — J479 Bronchiectasis, uncomplicated: Secondary | ICD-10-CM | POA: Diagnosis not present

## 2021-04-11 DIAGNOSIS — I2510 Atherosclerotic heart disease of native coronary artery without angina pectoris: Secondary | ICD-10-CM | POA: Diagnosis not present

## 2021-05-31 ENCOUNTER — Other Ambulatory Visit: Payer: Self-pay | Admitting: Internal Medicine

## 2021-07-12 DIAGNOSIS — K219 Gastro-esophageal reflux disease without esophagitis: Secondary | ICD-10-CM | POA: Diagnosis not present

## 2021-07-12 DIAGNOSIS — E039 Hypothyroidism, unspecified: Secondary | ICD-10-CM | POA: Diagnosis not present

## 2021-07-12 DIAGNOSIS — I739 Peripheral vascular disease, unspecified: Secondary | ICD-10-CM | POA: Diagnosis not present

## 2021-07-12 DIAGNOSIS — E782 Mixed hyperlipidemia: Secondary | ICD-10-CM | POA: Diagnosis not present

## 2021-07-12 DIAGNOSIS — Z0000 Encounter for general adult medical examination without abnormal findings: Secondary | ICD-10-CM | POA: Diagnosis not present

## 2021-07-12 DIAGNOSIS — N183 Chronic kidney disease, stage 3 unspecified: Secondary | ICD-10-CM | POA: Diagnosis not present

## 2021-07-12 DIAGNOSIS — I10 Essential (primary) hypertension: Secondary | ICD-10-CM | POA: Diagnosis not present

## 2021-07-12 DIAGNOSIS — R9389 Abnormal findings on diagnostic imaging of other specified body structures: Secondary | ICD-10-CM | POA: Diagnosis not present

## 2021-07-12 DIAGNOSIS — M545 Low back pain, unspecified: Secondary | ICD-10-CM | POA: Diagnosis not present

## 2021-08-20 DIAGNOSIS — L603 Nail dystrophy: Secondary | ICD-10-CM | POA: Diagnosis not present

## 2021-08-20 DIAGNOSIS — I739 Peripheral vascular disease, unspecified: Secondary | ICD-10-CM | POA: Diagnosis not present

## 2021-11-28 DIAGNOSIS — L603 Nail dystrophy: Secondary | ICD-10-CM | POA: Diagnosis not present

## 2021-11-28 DIAGNOSIS — L84 Corns and callosities: Secondary | ICD-10-CM | POA: Diagnosis not present

## 2021-11-28 DIAGNOSIS — I739 Peripheral vascular disease, unspecified: Secondary | ICD-10-CM | POA: Diagnosis not present

## 2022-01-11 ENCOUNTER — Other Ambulatory Visit: Payer: Self-pay | Admitting: *Deleted

## 2022-01-11 DIAGNOSIS — D487 Neoplasm of uncertain behavior of other specified sites: Secondary | ICD-10-CM | POA: Diagnosis not present

## 2022-01-11 DIAGNOSIS — Z23 Encounter for immunization: Secondary | ICD-10-CM | POA: Diagnosis not present

## 2022-01-11 DIAGNOSIS — M25551 Pain in right hip: Secondary | ICD-10-CM | POA: Diagnosis not present

## 2022-01-11 DIAGNOSIS — N183 Chronic kidney disease, stage 3 unspecified: Secondary | ICD-10-CM | POA: Diagnosis not present

## 2022-01-11 DIAGNOSIS — E782 Mixed hyperlipidemia: Secondary | ICD-10-CM | POA: Diagnosis not present

## 2022-01-11 DIAGNOSIS — D489 Neoplasm of uncertain behavior, unspecified: Secondary | ICD-10-CM | POA: Diagnosis not present

## 2022-01-11 DIAGNOSIS — K219 Gastro-esophageal reflux disease without esophagitis: Secondary | ICD-10-CM | POA: Diagnosis not present

## 2022-01-11 DIAGNOSIS — E039 Hypothyroidism, unspecified: Secondary | ICD-10-CM | POA: Diagnosis not present

## 2022-01-11 DIAGNOSIS — I779 Disorder of arteries and arterioles, unspecified: Secondary | ICD-10-CM

## 2022-01-11 DIAGNOSIS — Z95828 Presence of other vascular implants and grafts: Secondary | ICD-10-CM

## 2022-01-11 DIAGNOSIS — M545 Low back pain, unspecified: Secondary | ICD-10-CM | POA: Diagnosis not present

## 2022-01-11 DIAGNOSIS — D485 Neoplasm of uncertain behavior of skin: Secondary | ICD-10-CM | POA: Diagnosis not present

## 2022-01-11 DIAGNOSIS — I739 Peripheral vascular disease, unspecified: Secondary | ICD-10-CM | POA: Diagnosis not present

## 2022-01-28 ENCOUNTER — Ambulatory Visit (INDEPENDENT_AMBULATORY_CARE_PROVIDER_SITE_OTHER)
Admission: RE | Admit: 2022-01-28 | Discharge: 2022-01-28 | Disposition: A | Discharge status: Home / Self Care | Payer: Medicare Other | Source: Ambulatory Visit | Attending: Surgery | Admitting: Surgery

## 2022-01-28 ENCOUNTER — Ambulatory Visit: Payer: Medicare Other | Admitting: Physician Assistant

## 2022-01-28 ENCOUNTER — Ambulatory Visit (HOSPITAL_COMMUNITY)
Admission: RE | Admit: 2022-01-28 | Discharge: 2022-01-28 | Disposition: A | Discharge status: Home / Self Care | Payer: Medicare Other | Source: Ambulatory Visit | Attending: Surgery | Admitting: Surgery

## 2022-01-28 VITALS — BP 151/66 | HR 50 | Temp 98.0°F | Resp 16 | Ht 69.0 in | Wt 180.5 lb

## 2022-01-28 DIAGNOSIS — Z95828 Presence of other vascular implants and grafts: Secondary | ICD-10-CM | POA: Diagnosis not present

## 2022-01-28 DIAGNOSIS — I779 Disorder of arteries and arterioles, unspecified: Secondary | ICD-10-CM | POA: Diagnosis not present

## 2022-01-28 LAB — VAS US ABI WITH/WO TBI
Left ABI: 1.25
Right ABI: 1.12

## 2022-01-28 NOTE — Progress Notes (Signed)
Office Note     CC:  follow up Requesting Provider:  Alroy Dust, L.Marlou Sa, MD  HPI: Walter [name] is a 89 y.o. (01/08/1934) male who presents for routine follow up of PAD. He has remote history of left common femoral to above knee popliteal artery bypass with vein in November of 2015 by Dr. Trula Slade. This was performed due to toe ulceration.   At his last visit in August of 2022 he was doing well without symptoms. His non invasive studies showed patent bypass graft.   Today he reports he overall is doing well. He says he does have some sharp shooting pains in his feet that occurs at night and wakes him up. He says it only lasts several seconds but is strong enough to wake him. It goes away though and usually he can fall back to sleep. He does not have any pain on ambulation and no tissue loss. He explains that he was having trouble with swelling but he was taken off his blood pressure medication and this resolved. He does notice now though that his feet tend to stay cold, left more than right. This at times is bothersome. He also has some numbness in his feet. For this reason he now uses cane to ambulate just for balance and comfort. Otherwise he says he lives independently and is able to do all his ADL's. He visits his wife at the nursing home every day.   The pt is on a statin for cholesterol management.  The pt is not on a daily aspirin.   Other AC:  Plavix The pt is on CCB for hypertension.   The pt is not diabetic.   Tobacco hx:  Former  Past Medical History:  Diagnosis Date   Anemia    pt denies this   Anxiety    Arthritis    "legs and feet" (06/10/2013)   Barrett's esophagus    "never dilated" (06/10/2013)   Cataract    Chronic kidney disease    stage 3   Colon polyps    tubular adenoma-last colon 12/18/2005   Depression    Diverticulosis    GERD (gastroesophageal reflux disease)    takes omeprazole   Hematoma    on mid back   Hiatal hernia    High cholesterol    Hypertension     Hypothyroidism    takes synthroid   Peripheral vascular disease ([place])     Past Surgical History:  Procedure Laterality Date   CARDIAC CATHETERIZATION     CATARACT EXTRACTION W/ INTRAOCULAR LENS  IMPLANT, BILATERAL Bilateral    COLONOSCOPY     EYE SURGERY     FEMORAL-POPLITEAL BYPASS GRAFT Left 11/27/2012   Procedure: BYPASS GRAFT LEFT ABOVE KNEE TO BELOW KNEE POPLITEAL ARTERY USING LEFT NONREVERSED GREATER SAPPHENOUS VEIN;  Surgeon: Serafina Mitchell, MD;  Location: [place];  Service: Vascular;  Laterality: Left;   I & D EXTREMITY Left 06/10/2013   leg; "from the OR in 11/2012"   I & D EXTREMITY Left 06/10/2013   Procedure: IRRIGATION AND DEBRIDEMENT OF LEFT UPPER LEG;  Surgeon: Serafina Mitchell, MD;  Location: [place];  Service: Vascular;  Laterality: Left;   LOWER EXTREMITY ANGIOGRAM N/A 10/01/2012   Procedure: LOWER EXTREMITY ANGIOGRAM;  Surgeon: Jettie Booze, MD;  Location: [facility] CATH LAB;  Service: Cardiovascular;  Laterality: N/A;   POLYPECTOMY     stomach   PYLOROPLASTY     TONSILLECTOMY     TRANSURETHRAL RESECTION OF PROSTATE  UPPER GASTROINTESTINAL ENDOSCOPY      Social History   Socioeconomic History   Marital status: Married    Spouse name: Not on file   Number of children: 4   Years of education: Not on file   Highest education level: Not on file  Occupational History   Occupation: Retired  Tobacco Use   Smoking status: Former    Packs/day: 1.00    Years: 20.00    Total pack years: 20.00    Types: Cigarettes    Quit date: 06/28/1985    Years since quitting: 36.6   Smokeless tobacco: Never  Vaping Use   Vaping Use: Never used  Substance and Sexual Activity   Alcohol use: No    Comment: 06/10/2013 "drank recreationally when I did drink; stopped ~ 2010"   Drug use: No   Sexual activity: Never  Other Topics Concern   Not on file  Social History Narrative   Not on file   Social Determinants of Health   Financial Resource Strain: Not on file  Food  Insecurity: Not on file  Transportation Needs: Not on file  Physical Activity: Not on file  Stress: Not on file  Social Connections: Not on file  Intimate Partner Violence: Not on file    Family History  Problem Relation Age of Onset   Heart disease Mother    Lung cancer Brother        and sister   Melanoma Father    Alzheimer's disease Sister        x 2   Heart disease Sister    Hypertension Sister    Colon cancer Neg Hx    Colon polyps Neg Hx    Rectal cancer Neg Hx    Pancreatic cancer Neg Hx    Stomach cancer Neg Hx    Esophageal cancer Neg Hx     Current Outpatient Medications  Medication Sig Dispense Refill   acetaminophen (TYLENOL) 500 MG tablet Take 1,000 mg by mouth at bedtime.     alum & mag hydroxide-simeth (MAALOX/MYLANTA) 200-200-20 MG/5ML suspension Take 15 mLs by mouth daily.     clopidogrel (PLAVIX) 75 MG tablet Take 1 tablet (75 mg total) by mouth daily. 15 tablet 0   feeding supplement (BOOST HIGH PROTEIN) LIQD Take 1 Container by mouth 2 (two) times daily between meals.     levothyroxine (SYNTHROID, LEVOTHROID) 125 MCG tablet Take 125 mcg by mouth at bedtime.     midodrine (PROAMATINE) 2.5 MG tablet Take 1 tablet (2.5 mg total) by mouth 2 (two) times daily with a meal. 60 tablet 0   Omega 3-6-9 Fatty Acids (OMEGA 3-6-9 COMPLEX PO) Take 2 capsules by mouth daily.     omeprazole (PRILOSEC) 40 MG capsule TAKE 1 CAPSULE BY MOUTH  DAILY 100 capsule 2   Probiotic Product (PROBIOTIC PO) Take by mouth daily. Colon support OTC (Patient not taking: Reported on 02/21/2021)     rosuvastatin (CRESTOR) 10 MG tablet Take 10 mg by mouth daily.     No current facility-administered medications for this visit.    Allergies  Allergen Reactions   Penicillins Other (See Comments)    Boils     REVIEW OF SYSTEMS:  '[X]'$  denotes positive finding, '[ ]'$  denotes negative finding Cardiac  Comments:  Chest pain or chest pressure:    Shortness of breath upon exertion:    Short  of breath when lying flat:    Irregular heart rhythm:  Vascular    Pain in calf, thigh, or hip brought on by ambulation:    Pain in feet at night that wakes you up from your sleep:     Blood clot in your veins:    Leg swelling:         Pulmonary    Oxygen at home:    Productive cough:     Wheezing:         Neurologic    Sudden weakness in arms or legs:     Sudden numbness in arms or legs:     Sudden onset of difficulty speaking or slurred speech:    Temporary loss of vision in one eye:     Problems with dizziness:         Gastrointestinal    Blood in stool:     Vomited blood:         Genitourinary    Burning when urinating:     Blood in urine:        Psychiatric    Major depression:         Hematologic    Bleeding problems:    Problems with blood clotting too easily:        Skin    Rashes or ulcers:        Constitutional    Fever or chills:      PHYSICAL EXAMINATION:  Vitals:   01/28/22 1437  BP: (!) 151/66  Pulse: (!) 50  Resp: 16  Temp: 98 F (36.7 C)  TempSrc: Temporal  SpO2: 98%  Weight: 180 lb 8 oz (81.9 kg)  Height: '5\' 9"'$  (1.753 m)    General:  WDWN in NAD; vital signs documented above Gait: ambulates with cane HENT: WNL, normocephalic Pulmonary: normal non-labored breathing , without wheezing Cardiac: regular HR Vascular Exam/Pulses:  Right Left  Radial 2+ (normal) 2+ (normal)  Femoral 2+ (normal) 2+ (normal)  DP 2+ (normal) 2+ (normal)  PT 1+ (weak) absent   Extremities: without ischemic changes, without Gangrene , without cellulitis; without open wounds;  Musculoskeletal: no muscle wasting or atrophy  Neurologic: A&O X 3;  No focal weakness or paresthesias are detected Psychiatric:  The pt has Normal affect.   Non-Invasive Vascular Imaging:   +-------+-----------+-----------+------------+------------+  ABI/TBIToday's ABIToday's TBIPrevious ABIPrevious TBI  +-------+-----------+-----------+------------+------------+   Right 1.12       0.74       0.91        0.67          +-------+-----------+-----------+------------+------------+  Left  1.25       1.04       1.06        0.69          +-------+-----------+-----------+------------+------------+   VAS [place] lower extremity bypass graft duplex: Left: widely patent above knee to below knee popliteal bypass graft   ASSESSMENT/PLAN:: 89 y.o. male here for routine follow up of PAD. He has remote history of left common femoral to above knee popliteal artery bypass with vein in November of 2015 by Dr. Trula Slade. This was performed due to toe ulceration. He presently is without rest pain, claudication or tissue loss. I think his current symptoms are likely related to neuropathy.  - ABI today shows increase bilaterally.  Duplex shows patent left lower extremity bypass graft. The velocities in the bypass have decreased from prior study so I have recommended earlier follow up - continue Statin and Plavix - He will follow up in 6 months with ABI and  left lower extremity bypass graft duplex   Karoline Caldwell, PA-C [facility] MD:   Trula Slade

## 2022-01-30 ENCOUNTER — Other Ambulatory Visit: Payer: Self-pay

## 2022-01-30 DIAGNOSIS — Z95828 Presence of other vascular implants and grafts: Secondary | ICD-10-CM

## 2022-01-30 DIAGNOSIS — I779 Disorder of arteries and arterioles, unspecified: Secondary | ICD-10-CM

## 2022-02-27 DIAGNOSIS — I739 Peripheral vascular disease, unspecified: Secondary | ICD-10-CM | POA: Diagnosis not present

## 2022-02-27 DIAGNOSIS — L84 Corns and callosities: Secondary | ICD-10-CM | POA: Diagnosis not present

## 2022-02-27 DIAGNOSIS — L603 Nail dystrophy: Secondary | ICD-10-CM | POA: Diagnosis not present

## 2022-05-27 DIAGNOSIS — I739 Peripheral vascular disease, unspecified: Secondary | ICD-10-CM | POA: Diagnosis not present

## 2022-05-27 DIAGNOSIS — L84 Corns and callosities: Secondary | ICD-10-CM | POA: Diagnosis not present

## 2022-05-27 DIAGNOSIS — L603 Nail dystrophy: Secondary | ICD-10-CM | POA: Diagnosis not present

## 2022-06-07 ENCOUNTER — Other Ambulatory Visit: Payer: Self-pay | Admitting: Internal Medicine

## 2022-06-25 DIAGNOSIS — L089 Local infection of the skin and subcutaneous tissue, unspecified: Secondary | ICD-10-CM | POA: Diagnosis not present

## 2022-06-25 DIAGNOSIS — S6992XA Unspecified injury of left wrist, hand and finger(s), initial encounter: Secondary | ICD-10-CM | POA: Diagnosis not present

## 2022-06-25 DIAGNOSIS — W5501XA Bitten by cat, initial encounter: Secondary | ICD-10-CM | POA: Diagnosis not present

## 2022-07-15 DIAGNOSIS — N1831 Chronic kidney disease, stage 3a: Secondary | ICD-10-CM | POA: Diagnosis not present

## 2022-07-15 DIAGNOSIS — I739 Peripheral vascular disease, unspecified: Secondary | ICD-10-CM | POA: Diagnosis not present

## 2022-07-15 DIAGNOSIS — M25551 Pain in right hip: Secondary | ICD-10-CM | POA: Diagnosis not present

## 2022-07-15 DIAGNOSIS — M545 Low back pain, unspecified: Secondary | ICD-10-CM | POA: Diagnosis not present

## 2022-07-15 DIAGNOSIS — E039 Hypothyroidism, unspecified: Secondary | ICD-10-CM | POA: Diagnosis not present

## 2022-07-15 DIAGNOSIS — K219 Gastro-esophageal reflux disease without esophagitis: Secondary | ICD-10-CM | POA: Diagnosis not present

## 2022-07-15 DIAGNOSIS — Z0000 Encounter for general adult medical examination without abnormal findings: Secondary | ICD-10-CM | POA: Diagnosis not present

## 2022-07-15 DIAGNOSIS — E782 Mixed hyperlipidemia: Secondary | ICD-10-CM | POA: Diagnosis not present

## 2022-07-15 DIAGNOSIS — R2689 Other abnormalities of gait and mobility: Secondary | ICD-10-CM | POA: Diagnosis not present

## 2022-07-22 ENCOUNTER — Ambulatory Visit: Payer: Medicare Other | Admitting: Surgical

## 2022-07-22 ENCOUNTER — Other Ambulatory Visit: Payer: Self-pay

## 2022-07-22 ENCOUNTER — Other Ambulatory Visit (INDEPENDENT_AMBULATORY_CARE_PROVIDER_SITE_OTHER): Payer: Medicare Other

## 2022-07-22 DIAGNOSIS — M1611 Unilateral primary osteoarthritis, right hip: Secondary | ICD-10-CM

## 2022-07-22 DIAGNOSIS — M79604 Pain in right leg: Secondary | ICD-10-CM

## 2022-07-23 ENCOUNTER — Encounter: Payer: Self-pay | Admitting: Surgical

## 2022-07-23 MED ORDER — LIDOCAINE HCL 1 % IJ SOLN
5.0000 mL | INTRAMUSCULAR | Status: AC | PRN
  Administered 2022-07-22: 5 mL

## 2022-07-23 MED ORDER — METHYLPREDNISOLONE ACETATE 40 MG/ML IJ SUSP
40.0000 mg | INTRAMUSCULAR | Status: AC | PRN
  Administered 2022-07-22: 40 mg via INTRA_ARTICULAR

## 2022-07-23 MED ORDER — BUPIVACAINE HCL 0.25 % IJ SOLN
4.0000 mL | INTRAMUSCULAR | Status: AC | PRN
  Administered 2022-07-22: 4 mL via INTRA_ARTICULAR

## 2022-07-23 NOTE — Progress Notes (Signed)
Office Visit Note   Patient: Walter [name]           Date of Birth: [birth year]           MRN: [id number] Visit Date: 07/22/2022 Requested by: Clovis Riley, L.August Saucer, MD [address] [place],  [place] [address] PCP: Clovis Riley, L.August Saucer, MD  Subjective: Chief Complaint  Patient presents with   Right Leg - Pain    HPI: Walter [name] is a 89 y.o. male who presents to the office reporting low back pain and right hip pain.  Patient states that over the last several months patient has developed progressive right hip pain that he localizes to the groin.  No history of injury but did have a fall in 2023 demonstrating no hip fracture at the time.  Feels the leg feels heavy.  If he has to get into a low car, this exacerbates his groin pain.  Ambulating with a cane.  Pain will occasionally wake him up sleep at night.  Also describes low back pain that he localizes to the axial lumbar spine and right-sided paraspinal region.  No radicular pain down the leg.  No numbness or tingling.  No bowel or bladder incontinence.  Denies any history of diabetes or smoking but does take Eliquis and he has history of stage III CKD.              ROS: All systems reviewed are negative as they relate to the chief complaint within the history of present illness.  Patient denies fevers or chills.  Assessment & Plan: Visit Diagnoses:  1. Arthritis of right hip   2. Pain in right leg     Plan: Patient is an 89 year old male who presents for evaluation of low back pain right hip pain.  Has primarily groin pain with radiographs taken today demonstrating moderate to severe right hip arthritis that is asymmetric compared with the left hip.  After discussion of options, he would like to try right hip injection.  This was administered successfully under ultrasound guidance.  Patient tolerated procedure well.  Care was taken to avoid any vascular structures with the use of color Doppler especially with patient taking  Eliquis.  We will see how this injection does for him.  Regarding his low back pain, this is more chronic for him and has been longstanding for several years but he is gotten to the point where he wants to do something about it.  He has significant facet joint arthritis noted at multiple levels with spondylolisthesis noted at multiple levels as well.  We discussed options such as working with physical therapy and MRI of the lumbar spine to further evaluate for all sources of low back pain with likely ESI's versus facet joint injections to follow the MRI.  He would like to start with MRI scan and we will see him back in 4 weeks to review the scan and see how his hip is doing.  Patient agreed with plan.  Follow-up in 4 weeks.  Follow-Up Instructions: No follow-ups on file.   Orders:  Orders Placed This Encounter  Procedures   XR HIP UNILAT W OR W/O PELVIS 2-3 VIEWS RIGHT   XR Lumbar Spine 2-3 Views   US Guided Needle Placement - No Linked Charges   MR Lumbar Spine w/o contrast   No orders of the defined types were placed in this encounter.     Procedures: Large Joint Inj: R hip joint on 07/22/2022 8:36 AM Indications:  pain and diagnostic evaluation Details: 22 G 3.5 in needle, ultrasound-guided lateral approach  Arthrogram: No  Medications: 5 mL lidocaine 1 %; 4 mL bupivacaine 0.25 %; 40 mg methylPREDNISolone acetate 40 MG/ML Outcome: tolerated well, no immediate complications Procedure, treatment alternatives, risks and benefits explained, specific risks discussed. Consent was given by the patient. Immediately prior to procedure a time out was called to verify the correct patient, procedure, equipment, support staff and site/side marked as required. Patient was prepped and draped in the usual sterile fashion.       Clinical Data: No additional findings.  Objective: Vital Signs: There were no vitals taken for this visit.  Physical Exam:  Constitutional: Patient appears  well-developed HEENT:  Head: Normocephalic Eyes:EOM are normal Neck: Normal range of motion Cardiovascular: Normal rate Pulmonary/chest: Effort normal Neurologic: Patient is alert Skin: Skin is warm Psychiatric: Patient has normal mood and affect  Ortho Exam: Ortho exam demonstrates negative straight leg raise in the right leg.  Positive FADIR sign reproducing groin pain in the right hip.  Positive Stinchfield sign reproducing groin pain.  No calf tenderness.  Negative Homans' sign.  Intact hip flexion, quadricep, hamstring, dorsiflexion, plantarflexion, EHL rated 5/5.  She has tenderness throughout the the axial lumbar spine especially around the level of L4 and L5.  No tenderness over the SI joint bilaterally.  No tenderness over the trochanters bilaterally.  Specialty Comments:  No specialty comments available.  Imaging: No results found.   PMFS History: Patient Active Problem List   Diagnosis Date Noted   Syncope and collapse 02/21/2021   Nausea vomiting and diarrhea 02/21/2021   Abnormal chest x-ray 02/21/2021   DNR (do not resuscitate) 02/21/2021   Dyslipidemia 02/21/2021   SIRS (systemic inflammatory response syndrome) (HCC) 05/30/2014   Blood in stool 05/28/2014   Dehydration 05/28/2014   Clostridium difficile colitis 05/28/2014   Acute kidney injury (HCC) 05/28/2014   Abdominal pain    PVD (peripheral vascular disease) (HCC) 10/25/2013   Seroma, infected, postoperative 06/10/2013   Pain in limb-Left Medial thigh 06/03/2013   Post op infection-Left Medial Thigh 06/03/2013   Atherosclerosis of native arteries of the extremities with ulceration(440.23) 01/04/2013   Peripheral vascular disease (HCC) 11/09/2012   Atherosclerosis of native arteries of the extremities with intermittent claudication 10/19/2012   HEMATOMA 09/06/2009   WEIGHT LOSS-ABNORMAL 05/04/2009   ABDOMINAL PAIN -GENERALIZED 05/04/2009   CHEST PAIN 10/26/2007   COLONIC POLYPS 06/09/2007    Hypothyroidism 06/09/2007   DEPRESSION 06/09/2007   Essential hypertension 06/09/2007   RESIDUAL HEMORRHOIDAL SKIN TAGS 06/09/2007   GERD 06/09/2007   BARRETTS ESOPHAGUS 06/09/2007   HIATAL HERNIA 06/09/2007   DIVERTICULOSIS, COLON 06/09/2007   ARTHRITIS 06/09/2007   Past Medical History:  Diagnosis Date   Anemia    pt denies this   Anxiety    Arthritis    "legs and feet" (06/10/2013)   Barrett's esophagus    "never dilated" (06/10/2013)   Cataract    Chronic kidney disease    stage 3   Colon polyps    tubular adenoma-last colon 12/18/2005   Depression    Diverticulosis    GERD (gastroesophageal reflux disease)    takes omeprazole   Hematoma    on mid back   Hiatal hernia    High cholesterol    Hypertension    Hypothyroidism    takes synthroid   Peripheral vascular disease (HCC)     Family History  Problem Relation Age of Onset   Heart disease  Mother    Lung cancer Brother        and sister   Melanoma Father    Alzheimer's disease Sister        x 2   Heart disease Sister    Hypertension Sister    Colon cancer Neg Hx    Colon polyps Neg Hx    Rectal cancer Neg Hx    Pancreatic cancer Neg Hx    Stomach cancer Neg Hx    Esophageal cancer Neg Hx     Past Surgical History:  Procedure Laterality Date   CARDIAC CATHETERIZATION     CATARACT EXTRACTION W/ INTRAOCULAR LENS  IMPLANT, BILATERAL Bilateral    COLONOSCOPY     EYE SURGERY     FEMORAL-POPLITEAL BYPASS GRAFT Left 11/27/2012   Procedure: BYPASS GRAFT LEFT ABOVE KNEE TO BELOW KNEE POPLITEAL ARTERY USING LEFT NONREVERSED GREATER SAPPHENOUS VEIN;  Surgeon: Nada Libman, MD;  Location: [facility] OR;  Service: Vascular;  Laterality: Left;   I & D EXTREMITY Left 06/10/2013   leg; "from the OR in 11/2012"   I & D EXTREMITY Left 06/10/2013   Procedure: IRRIGATION AND DEBRIDEMENT OF LEFT UPPER LEG;  Surgeon: Nada Libman, MD;  Location[facility] OR;  Service: Vascular;  Laterality: Left;   LOWER EXTREMITY ANGIOGRAM N/A  10/01/2012   Procedure: LOWER EXTREMITY ANGIOGRAM;  Surgeon: Corky Crafts, MD;  Location[facility] CATH LAB;  Service: Cardiovascular;  Laterality: N/A;   POLYPECTOMY     stomach   PYLOROPLASTY     TONSILLECTOMY     TRANSURETHRAL RESECTION OF PROSTATE     UPPER GASTROINTESTINAL ENDOSCOPY     Social History   Occupational History   Occupation: Retired  Tobacco Use   Smoking status: Former    Current packs/day: 0.00    Average packs/day: 1 pack/day for 20.0 years (20.0 ttl pk-yrs)    Types: Cigarettes    Start date: 06/28/1965    Quit date: 06/28/1985    Years since quitting: 37.0   Smokeless tobacco: Never  Vaping Use   Vaping status: Never Used  Substance and Sexual Activity   Alcohol use: No    Comment: 06/10/2013 "drank recreationally when I did drink; stopped ~ 2010"   Drug use: No   Sexual activity: Never

## 2022-07-29 ENCOUNTER — Ambulatory Visit: Payer: Medicare Other | Admitting: Physician Assistant

## 2022-07-29 ENCOUNTER — Ambulatory Visit (INDEPENDENT_AMBULATORY_CARE_PROVIDER_SITE_OTHER)
Admission: RE | Admit: 2022-07-29 | Discharge: 2022-07-29 | Disposition: A | Discharge status: Home / Self Care | Payer: Medicare Other | Source: Ambulatory Visit | Attending: Surgery | Admitting: Surgery

## 2022-07-29 ENCOUNTER — Ambulatory Visit (HOSPITAL_COMMUNITY)
Admission: RE | Admit: 2022-07-29 | Discharge: 2022-07-29 | Disposition: A | Discharge status: Home / Self Care | Payer: Medicare Other | Source: Ambulatory Visit | Attending: Surgery | Admitting: Surgery

## 2022-07-29 VITALS — BP 164/83 | HR 48 | Temp 97.4°F | Resp 16 | Ht 69.0 in | Wt 175.0 lb

## 2022-07-29 DIAGNOSIS — I779 Disorder of arteries and arterioles, unspecified: Secondary | ICD-10-CM | POA: Insufficient documentation

## 2022-07-29 DIAGNOSIS — I872 Venous insufficiency (chronic) (peripheral): Secondary | ICD-10-CM | POA: Diagnosis not present

## 2022-07-29 DIAGNOSIS — Z95828 Presence of other vascular implants and grafts: Secondary | ICD-10-CM

## 2022-07-29 LAB — VAS US ABI WITH/WO TBI
Left ABI: 0.96
Right ABI: 0.94

## 2022-07-29 NOTE — Progress Notes (Signed)
Office Note     CC:  follow up Requesting Provider:  Clovis Riley, L.August Saucer, MD  HPI: Walter [name] is a 89 y.o. (January 10, 1933) male who presents for follow up of peripheral artery disease. He has remote history of left common femoral to above knee popliteal artery bypass with vein in November of 2015 by Dr. [name]. This was performed due to toe ulceration. His toe subsequently healed with no recurrent wound issues. He has had issues with swelling in his legs as well as coolness and numbness in his feet. At his last visit in January of 2024 he was ambulating with cane to help with his balance.   Today he reports no major concerns. He continues to have swelling in his left > right leg. He elevates and this helps. He says upon first waking the swelling is down but as day goes on it returns. He also wears compression stockings daily. He denies any pain in his legs on ambulation or rest. No tissue loss. He does explain that he has been having a lot of back pain recently and hip pain that is limiting his ambulation tolerance. He had a cortisone injection in his right hip last week, which he says has made it feel better. He will go back in several weeks to have an MRI of his low back. He continues to live independently. He still is using cane to help with his walking. He says his balance is off so it is helpful to have cane for support. He visits his wife daily in the nursing home. He explains that he was hopeful she would return home but her Dementia is worsening.   The pt is on a statin for cholesterol management.  The pt is not on a daily aspirin.   Other AC:  Plavix The pt is on CCB for hypertension.   The pt is not diabetic.   Tobacco hx:  Former  Past Medical History:  Diagnosis Date   Anemia    pt denies this   Anxiety    Arthritis    "legs and feet" (06/10/2013)   Barrett's esophagus    "never dilated" (06/10/2013)   Cataract    Chronic kidney disease    stage 3   Colon polyps    tubular  adenoma-last colon 12/18/2005   Depression    Diverticulosis    GERD (gastroesophageal reflux disease)    takes omeprazole   Hematoma    on mid back   Hiatal hernia    High cholesterol    Hypertension    Hypothyroidism    takes synthroid   Peripheral vascular disease (HCC)     Past Surgical History:  Procedure Laterality Date   CARDIAC CATHETERIZATION     CATARACT EXTRACTION W/ INTRAOCULAR LENS  IMPLANT, BILATERAL Bilateral    COLONOSCOPY     EYE SURGERY     FEMORAL-POPLITEAL BYPASS GRAFT Left 11/27/2012   Procedure: BYPASS GRAFT LEFT ABOVE KNEE TO BELOW KNEE POPLITEAL ARTERY USING LEFT NONREVERSED GREATER SAPPHENOUS VEIN;  Surgeon: Nada Libman, MD;  Location: [facility] OR;  Service: Vascular;  Laterality: Left;   I & D EXTREMITY Left 06/10/2013   leg; "from the OR in 11/2012"   I & D EXTREMITY Left 06/10/2013   Procedure: IRRIGATION AND DEBRIDEMENT OF LEFT UPPER LEG;  Surgeon: Nada Libman, MD;  Location: [facility] OR;  Service: Vascular;  Laterality: Left;   LOWER EXTREMITY ANGIOGRAM N/A 10/01/2012   Procedure: LOWER EXTREMITY ANGIOGRAM;  Surgeon: Corky Crafts,  MD;  Location: [facility] CATH LAB;  Service: Cardiovascular;  Laterality: N/A;   POLYPECTOMY     stomach   PYLOROPLASTY     TONSILLECTOMY     TRANSURETHRAL RESECTION OF PROSTATE     UPPER GASTROINTESTINAL ENDOSCOPY      Social History   Socioeconomic History   Marital status: Married    Spouse name: Not on file   Number of children: 4   Years of education: Not on file   Highest education level: Not on file  Occupational History   Occupation: Retired  Tobacco Use   Smoking status: Former    Current packs/day: 0.00    Average packs/day: 1 pack/day for 20.0 years (20.0 ttl pk-yrs)    Types: Cigarettes    Start date: 06/28/1965    Quit date: 06/28/1985    Years since quitting: 37.1   Smokeless tobacco: Never  Vaping Use   Vaping status: Never Used  Substance and Sexual Activity   Alcohol use: No    Comment: 06/10/2013  "drank recreationally when I did drink; stopped ~ 2010"   Drug use: No   Sexual activity: Never  Other Topics Concern   Not on file  Social History Narrative   Not on file   Social Determinants of Health   Financial Resource Strain: Not on file  Food Insecurity: Not on file  Transportation Needs: Not on file  Physical Activity: Not on file  Stress: Not on file  Social Connections: Not on file  Intimate Partner Violence: Not on file    Family History  Problem Relation Age of Onset   Heart disease Mother    Lung cancer Brother        and sister   Melanoma Father    Alzheimer's disease Sister        x 2   Heart disease Sister    Hypertension Sister    Colon cancer Neg Hx    Colon polyps Neg Hx    Rectal cancer Neg Hx    Pancreatic cancer Neg Hx    Stomach cancer Neg Hx    Esophageal cancer Neg Hx     Current Outpatient Medications  Medication Sig Dispense Refill   acetaminophen (TYLENOL) 500 MG tablet Take 1,000 mg by mouth at bedtime.     alum & mag hydroxide-simeth (MAALOX/MYLANTA) 200-200-20 MG/5ML suspension Take 15 mLs by mouth daily.     clopidogrel (PLAVIX) 75 MG tablet Take 1 tablet (75 mg total) by mouth daily. 15 tablet 0   feeding supplement (BOOST HIGH PROTEIN) LIQD Take 1 Container by mouth 2 (two) times daily between meals.     levothyroxine (SYNTHROID, LEVOTHROID) 125 MCG tablet Take 125 mcg by mouth at bedtime.     Omega 3-6-9 Fatty Acids (OMEGA 3-6-9 COMPLEX PO) Take 2 capsules by mouth daily.     omeprazole (PRILOSEC) 40 MG capsule Take 1 capsule (40 mg total) by mouth daily. Office visit for further refills 100 capsule 2   Probiotic Product (PROBIOTIC PO) Take by mouth daily. Colon support OTC     rosuvastatin (CRESTOR) 10 MG tablet Take 10 mg by mouth daily.     midodrine (PROAMATINE) 2.5 MG tablet Take 1 tablet (2.5 mg total) by mouth 2 (two) times daily with a meal. (Patient not taking: Reported on 07/29/2022) 60 tablet 0   No current  facility-administered medications for this visit.    Allergies  Allergen Reactions   Penicillins Other (See Comments)    Boils  REVIEW OF SYSTEMS:   [X]  denotes positive finding, [ ]  denotes negative finding Cardiac  Comments:  Chest pain or chest pressure:    Shortness of breath upon exertion:    Short of breath when lying flat:    Irregular heart rhythm:        Vascular    Pain in calf, thigh, or hip brought on by ambulation:    Pain in feet at night that wakes you up from your sleep:     Blood clot in your veins:    Leg swelling:  X       Pulmonary    Oxygen at home:    Productive cough:     Wheezing:         Neurologic    Sudden weakness in arms or legs:     Sudden numbness in arms or legs:     Sudden onset of difficulty speaking or slurred speech:    Temporary loss of vision in one eye:     Problems with dizziness:         Gastrointestinal    Blood in stool:     Vomited blood:         Genitourinary    Burning when urinating:     Blood in urine:        Psychiatric    Major depression:         Hematologic    Bleeding problems:    Problems with blood clotting too easily:        Skin    Rashes or ulcers:        Constitutional    Fever or chills:      PHYSICAL EXAMINATION:  Vitals:   07/29/22 1422  BP: (!) 164/83  Pulse: (!) 48  Resp: 16  Temp: (!) 97.4 F (36.3 C)  TempSrc: Temporal  SpO2: 97%  Weight: 175 lb (79.4 kg)  Height: 5\' 9"  (1.753 m)    General:  WDWN in NAD; vital signs documented above Gait: ambulates with cane HENT: WNL, normocephalic Pulmonary: normal non-labored breathing , without wheezing Cardiac: regular HR Abdomen: soft, NT, no masses Vascular Exam/Pulses: 2+ femoral pulses, 2+ left DP, right PT pulses, feet warm and well perfused. Toes cool Extremities: without ischemic changes, without Gangrene , without cellulitis; without open wounds; edema of left ankle 2+ pitting Musculoskeletal: no muscle wasting or  atrophy  Neurologic: A&O X 3 Psychiatric:  The pt has Normal affect.   Non-Invasive Vascular Imaging:    Left Graft #1: Above knee-below knee popliteal artery  +--------------------+--------+--------+----------+--------+                     PSV cm/sStenosisWaveform  Comments  +--------------------+--------+--------+----------+--------+  Inflow             74              biphasic            +--------------------+--------+--------+----------+--------+  Proximal Anastomosis62              monophasic          +--------------------+--------+--------+----------+--------+  Proximal Graft      52              biphasic            +--------------------+--------+--------+----------+--------+  Mid Graft           46              biphasic            +--------------------+--------+--------+----------+--------+  Distal Graft        43              biphasic            +--------------------+--------+--------+----------+--------+  Distal Anastomosis  51              biphasic            +--------------------+--------+--------+----------+--------+  Outflow            58              biphasic            +--------------------+--------+--------+----------+--------+   Summary:  Left: Widely patent left above knee-below knee popliteal artery bypass graft. Low velocities in the graft could suggest a threatened bypass.    ASSESSMENT/PLAN:: 89 y.o. male here for follow up of peripheral artery disease. He has remote history of left common femoral to above knee popliteal artery bypass with vein in November of 2015 by Dr. [name]. This was performed due to toe ulceration. His toe subsequently healed with no recurrent wound issues.He is without any claudication or rest pain. He does have edema in his left> right leg. This is unchanged.  -Duplex today shows patent left AK to BK popliteal bypass. He continues to have low velocities throughout graft but these are  unchanged from 6 months ago. Actually getting biphasic flow today compared to last study - ABI's have decreased slightly I'm both legs but triphasic flow bilaterally - Encourage daily elevation and continued compression stocking use for swelling  - continue Plavix and Statin  - he knows to call for earlier follow up should he have any new or concerning symptoms - He will follow up again in 6 months with repeat ABI and LLE bypass graft duplex   Graceann Congress, PA-C [facility] [phone]  Call MD:   Randie Heinz

## 2022-07-31 ENCOUNTER — Other Ambulatory Visit: Payer: Self-pay

## 2022-07-31 DIAGNOSIS — I779 Disorder of arteries and arterioles, unspecified: Secondary | ICD-10-CM

## 2022-08-19 ENCOUNTER — Ambulatory Visit: Payer: Medicare Other | Admitting: Surgical

## 2022-08-24 ENCOUNTER — Ambulatory Visit
Admission: RE | Admit: 2022-08-24 | Discharge: 2022-08-24 | Disposition: A | Discharge status: Home / Self Care | Payer: Medicare Other | Source: Ambulatory Visit | Attending: Surgical | Admitting: Surgical

## 2022-08-24 DIAGNOSIS — M5416 Radiculopathy, lumbar region: Secondary | ICD-10-CM | POA: Diagnosis not present

## 2022-08-24 DIAGNOSIS — M79604 Pain in right leg: Secondary | ICD-10-CM

## 2022-08-27 DIAGNOSIS — I739 Peripheral vascular disease, unspecified: Secondary | ICD-10-CM | POA: Diagnosis not present

## 2022-08-27 DIAGNOSIS — L603 Nail dystrophy: Secondary | ICD-10-CM | POA: Diagnosis not present

## 2022-09-02 ENCOUNTER — Ambulatory Visit: Payer: Medicare Other | Admitting: Surgical

## 2022-09-02 DIAGNOSIS — M1611 Unilateral primary osteoarthritis, right hip: Secondary | ICD-10-CM | POA: Diagnosis not present

## 2022-09-02 DIAGNOSIS — M47816 Spondylosis without myelopathy or radiculopathy, lumbar region: Secondary | ICD-10-CM

## 2022-09-03 ENCOUNTER — Encounter: Payer: Self-pay | Admitting: Surgical

## 2022-09-03 NOTE — Progress Notes (Signed)
Office Visit Note   Patient: Walter [name]           Date of Birth: May 27, 1933           MRN: [id number] Visit Date: 09/02/2022 Requested by: Clovis Riley, L.August Saucer, MD [address] [place],  [place] [address] PCP: Clovis Riley, L.August Saucer, MD  Subjective: Chief Complaint  Patient presents with   Right Hip - Pain    HPI: Walter [name] is a 89 y.o. male who presents to the office for MRI review. Continues to complain mainly of low back pain.  He did have groin pain in the right hip at his last visit which has been improved to 70% with intra-articular hip injection under ultrasound guidance.  He now complains of just focal low back pain.  No radicular pain down either leg except for some occasional radiation to the right buttock.  He has no numbness or tingling except the chronic neuropathy has in his feet.  No history of diabetes.  Leg will not give out on him.  Denies any weakness in his legs.  No bowel or bladder incontinence.  His back pain in the axial lumbar spine will bother him so much that he has less than 5 to 10-minute walking endurance.  Sometimes it feels like he cannot stand very long in the shower due to the amount of back pain he has.  He denies any cramping or pins/needles sensation in either calf or leg as he walks.  MRI results revealed: MR Lumbar Spine w/o contrast  Result Date: 09/02/2022 CLINICAL DATA:  MR lumbar spine eval for source of right radicular leg pain EXAM: MRI LUMBAR SPINE WITHOUT CONTRAST TECHNIQUE: Multiplanar, multisequence MR imaging of the lumbar spine was performed. No intravenous contrast was administered. COMPARISON:  CT L Spine 02/21/21 FINDINGS: Segmentation:  Standard. Alignment: Stepwise retrolisthesis of L3 on L4, L4 on L5, and L5 on S1. Vertebrae:  No fracture, evidence of discitis, or bone lesion. Conus medullaris and cauda equina: Conus extends to the L2 level. Conus and cauda equina appear normal. Paraspinal and other soft tissues: There are  small bilateral T2 hyperintense renal lesions which are too small to accurately characterize. Disc levels: T12-L1: Mild bilateral facet degenerative change. Eccentric right disc bulge. Mild spinal canal narrowing. No neural foraminal narrowing. L1-L2: Mild bilateral facet degenerative change. No significant disc bulge. No spinal canal narrowing. No neural foraminal narrowing. L2-L3: Mild bilateral facet degenerative change. No significant disc bulge. No spinal canal narrowing. Mild left neural foraminal narrowing. L3-L4: Mild bilateral facet degenerative change. Circumferential disc bulge. Mild spinal canal narrowing. Moderate bilateral neural foraminal narrowing. L4-L5: Moderate bilateral facet degenerative change. Circumferential disc bulge. Retrolisthesis of L4 on L5. Severe bilateral neural foraminal narrowing, right-greater-than-left. L5-S1: Moderate left and mild right facet degenerative change. Minimal disc bulge. No spinal canal narrowing. Moderate left and severe right neural foraminal narrowing. IMPRESSION: Lower lumbar spine predominant degenerative changes with severe neural foraminal narrowing at L4-L5 (bilateral) and L5-S1 (right). No evidence of high-grade spinal canal stenosis. Electronically Signed   By: Lorenza Cambridge M.D.   On: 09/02/2022 15:32                 ROS: All systems reviewed are negative as they relate to the chief complaint within the history of present illness.  Patient denies fevers or chills.  Assessment & Plan: Visit Diagnoses:  1. Facet arthritis of lumbar region   2. Arthritis of right hip  Plan: Walter [name] is a 89 y.o. male who presents to the office for review of MRI lumbar spine.  MRI does demonstrate significant foraminal narrowing at L4-L5 and L5-S1.  However, it does not really have any complaint of radicular leg pain or weakness that he has noticed (though he does have dorsiflexion weakness bilaterally that is evident on exam).  His main complaint today is  axial lumbar spine pain which is most likely due to disc bulging versus facet joint arthritis that he has at multiple levels.  Plan is to have him follow-up with Dr. Alvester Morin for facet joint injection versus lumbar spine ESI.  He does report taking Plavix and states he really does not follow with a cardiologist but does follow with a vascular surgeon Dr. [name].  If he has no relief from injections, could have him follow-up with Dr. Christell Constant for his evaluation but he does not want to pursue surgical intervention.  Follow-Up Instructions: No follow-ups on file.   Orders:  No orders of the defined types were placed in this encounter.  No orders of the defined types were placed in this encounter.     Procedures: No procedures performed   Clinical Data: No additional findings.  Objective: Vital Signs: There were no vitals taken for this visit.  Physical Exam:  Constitutional: Patient appears well-developed HEENT:  Head: Normocephalic Eyes:EOM are normal Neck: Normal range of motion Cardiovascular: Normal rate Pulmonary/chest: Effort normal Neurologic: Patient is alert Skin: Skin is warm Psychiatric: Patient has normal mood and affect  Ortho Exam: Ortho exam demonstrates right hip with reduced internal rotation passively but significantly reduced pain compared with last exam.  He has weakly positive FADIR sign.  Intact hip flexion, quadricep, hamstring, plantarflexion rated 5/5.  He has dorsiflexion strength rated 3/5 bilaterally.  EHL rated 4/5 bilaterally.  No calf tenderness.  Negative Homan sign.  Tenderness throughout the axial lumbar spine.  Pain is worse with extension but not spine flexion.  Specialty Comments:  No specialty comments available.  Imaging: No results found.   PMFS History: Patient Active Problem List   Diagnosis Date Noted   Syncope and collapse 02/21/2021   Nausea vomiting and diarrhea 02/21/2021   Abnormal chest x-ray 02/21/2021   DNR (do not  resuscitate) 02/21/2021   Dyslipidemia 02/21/2021   SIRS (systemic inflammatory response syndrome) (HCC) 05/30/2014   Blood in stool 05/28/2014   Dehydration 05/28/2014   Clostridium difficile colitis 05/28/2014   Acute kidney injury (HCC) 05/28/2014   Abdominal pain    PVD (peripheral vascular disease) (HCC) 10/25/2013   Seroma, infected, postoperative 06/10/2013   Pain in limb-Left Medial thigh 06/03/2013   Post op infection-Left Medial Thigh 06/03/2013   Atherosclerosis of native arteries of the extremities with ulceration(440.23) 01/04/2013   Peripheral vascular disease (HCC) 11/09/2012   Atherosclerosis of native arteries of the extremities with intermittent claudication 10/19/2012   HEMATOMA 09/06/2009   WEIGHT LOSS-ABNORMAL 05/04/2009   ABDOMINAL PAIN -GENERALIZED 05/04/2009   CHEST PAIN 10/26/2007   COLONIC POLYPS 06/09/2007   Hypothyroidism 06/09/2007   DEPRESSION 06/09/2007   Essential hypertension 06/09/2007   RESIDUAL HEMORRHOIDAL SKIN TAGS 06/09/2007   GERD 06/09/2007   BARRETTS ESOPHAGUS 06/09/2007   HIATAL HERNIA 06/09/2007   DIVERTICULOSIS, COLON 06/09/2007   ARTHRITIS 06/09/2007   Past Medical History:  Diagnosis Date   Anemia    pt denies this   Anxiety    Arthritis    "legs and feet" (06/10/2013)   Barrett's esophagus    "  never dilated" (06/10/2013)   Cataract    Chronic kidney disease    stage 3   Colon polyps    tubular adenoma-last colon 12/18/2005   Depression    Diverticulosis    GERD (gastroesophageal reflux disease)    takes omeprazole   Hematoma    on mid back   Hiatal hernia    High cholesterol    Hypertension    Hypothyroidism    takes synthroid   Peripheral vascular disease (HCC)     Family History  Problem Relation Age of Onset   Heart disease Mother    Lung cancer Brother        and sister   Melanoma Father    Alzheimer's disease Sister        x 2   Heart disease Sister    Hypertension Sister    Colon cancer Neg Hx     Colon polyps Neg Hx    Rectal cancer Neg Hx    Pancreatic cancer Neg Hx    Stomach cancer Neg Hx    Esophageal cancer Neg Hx     Past Surgical History:  Procedure Laterality Date   CARDIAC CATHETERIZATION     CATARACT EXTRACTION W/ INTRAOCULAR LENS  IMPLANT, BILATERAL Bilateral    COLONOSCOPY     EYE SURGERY     FEMORAL-POPLITEAL BYPASS GRAFT Left 11/27/2012   Procedure: BYPASS GRAFT LEFT ABOVE KNEE TO BELOW KNEE POPLITEAL ARTERY USING LEFT NONREVERSED GREATER SAPPHENOUS VEIN;  Surgeon: Nada Libman, MD;  Location: [facility] OR;  Service: Vascular;  Laterality: Left;   I & D EXTREMITY Left 06/10/2013   leg; "from the OR in 11/2012"   I & D EXTREMITY Left 06/10/2013   Procedure: IRRIGATION AND DEBRIDEMENT OF LEFT UPPER LEG;  Surgeon: Nada Libman, MD;  Location: [facility] OR;  Service: Vascular;  Laterality: Left;   LOWER EXTREMITY ANGIOGRAM N/A 10/01/2012   Procedure: LOWER EXTREMITY ANGIOGRAM;  Surgeon: Corky Crafts, MD;  Location: [facility] CATH LAB;  Service: Cardiovascular;  Laterality: N/A;   POLYPECTOMY     stomach   PYLOROPLASTY     TONSILLECTOMY     TRANSURETHRAL RESECTION OF PROSTATE     UPPER GASTROINTESTINAL ENDOSCOPY     Social History   Occupational History   Occupation: Retired  Tobacco Use   Smoking status: Former    Current packs/day: 0.00    Average packs/day: 1 pack/day for 20.0 years (20.0 ttl pk-yrs)    Types: Cigarettes    Start date: 06/28/1965    Quit date: 06/28/1985    Years since quitting: 37.2   Smokeless tobacco: Never  Vaping Use   Vaping status: Never Used  Substance and Sexual Activity   Alcohol use: No    Comment: 06/10/2013 "drank recreationally when I did drink; stopped ~ 2010"   Drug use: No   Sexual activity: Never

## 2022-09-04 ENCOUNTER — Other Ambulatory Visit: Payer: Self-pay

## 2022-09-04 DIAGNOSIS — M47816 Spondylosis without myelopathy or radiculopathy, lumbar region: Secondary | ICD-10-CM

## 2022-09-05 ENCOUNTER — Telehealth: Payer: Self-pay | Admitting: Physical Medicine and Rehabilitation

## 2022-09-05 ENCOUNTER — Encounter: Payer: Self-pay | Admitting: Physical Medicine and Rehabilitation

## 2022-09-05 ENCOUNTER — Ambulatory Visit: Payer: Medicare Other | Admitting: Physical Medicine and Rehabilitation

## 2022-09-05 VITALS — BP 146/72 | HR 56

## 2022-09-05 DIAGNOSIS — M47816 Spondylosis without myelopathy or radiculopathy, lumbar region: Secondary | ICD-10-CM

## 2022-09-05 DIAGNOSIS — M545 Low back pain, unspecified: Secondary | ICD-10-CM | POA: Diagnosis not present

## 2022-09-05 DIAGNOSIS — G8929 Other chronic pain: Secondary | ICD-10-CM | POA: Diagnosis not present

## 2022-09-05 NOTE — Progress Notes (Addendum)
Walter [name] - 89 y.o. male MRN [id number]  Date of birth: Jan 25, 1933  Office Visit Note: Visit Date: 09/05/2022 PCP: Clovis Riley, L.August Saucer, MD (Inactive) Referred by: Clovis Riley, L.August Saucer, MD  Subjective: Chief Complaint  Patient presents with   Lower Back - Pain    Discuss ESI S/p right hip injection   HPI: Walter [name] is a 89 y.o. male who comes in today per the request of Dr. Glee Arvin for evaluation of chronic, worsening and severe bilateral lower back pain. Pain ongoing for several years, worsens with standing and walking. Some relief of pain with home exercise regimen, rest and use of medications. He describes pain as constant sore and aching sensation, currently rate as 8 out of 10. Sitting seems to alleviate his pain significantly. History of formal physical therapy with minimal relief of pain. No history of lumbar surgery/injections. Recent lumbar MRI imaging exhibits stairstep retrolisthesis of L3 on L4, L4 on L5, and L5 on S1, moderate facet arthropathy at L4-L5 and L5-S1. There is also biforaminal narrowing and lateral recess stenosis at L4-L5. No high grade spinal canal stenosis. Patient was recently evaluated by my colleague Karenann Cai, PA, he underwent right intra-articular hip injection with significant relief of pain. He does have history of bypass graft to left lower extremity by Dr. Coral Else in 2014. He reports weakness to left lower extremity post surgery. No recent trauma or falls.    Review of Systems  Musculoskeletal:  Positive for back pain.  Neurological:  Positive for focal weakness. Negative for tingling and sensory change.  All other systems reviewed and are negative.  Otherwise per HPI.  Assessment & Plan: Visit Diagnoses:    ICD-10-CM   1. Chronic bilateral low back pain without sciatica  M54.50 Ambulatory referral to Physical Medicine Rehab   G89.29     2. Spondylosis without myelopathy or radiculopathy, lumbar region  M47.816 Ambulatory referral to  Physical Medicine Rehab    3. Facet arthropathy, lumbar  M47.816 Ambulatory referral to Physical Medicine Rehab       Plan: Findings:  Chronic, worsening and severe bilateral lower back pain. No radicular pain down the legs. Patient continues to have severe pain despite good conservative therapies such as home exercise regimen, rest and use of medications. I discussed recent lumbar MRI with him today using imaging and spine model. Patients clinical presentation and exam are consistent with facet mediated pain. There is facet arthropathy most pronounced at L4-L5. Next step is to perform bilateral L4-L5 facet joint injections under fluoroscopic guidance. I discussed injection procedure with patient today in detail, he has no questions at this time. If he does well with facet injections will plan to discuss possible longer sustained pain relief with radiofrequency ablation. No red flag symptoms noted upon exam today.     Meds & Orders: No orders of the defined types were placed in this encounter.   Orders Placed This Encounter  Procedures   Ambulatory referral to Physical Medicine Rehab    Follow-up: Return for Bilateral L4-L5 facet joint injections.   Procedures: No procedures performed      Clinical History: CLINICAL DATA:  MR lumbar spine eval for source of right radicular leg pain   EXAM: MRI LUMBAR SPINE WITHOUT CONTRAST   TECHNIQUE: Multiplanar, multisequence MR imaging of the lumbar spine was performed. No intravenous contrast was administered.   COMPARISON:  CT L Spine 02/21/21   FINDINGS: Segmentation:  Standard.   Alignment: Stepwise retrolisthesis of L3 on L4,  L4 on L5, and L5 on S1.   Vertebrae:  No fracture, evidence of discitis, or bone lesion.   Conus medullaris and cauda equina: Conus extends to the L2 level. Conus and cauda equina appear normal.   Paraspinal and other soft tissues: There are small bilateral T2 hyperintense renal lesions which are too small  to accurately characterize.   Disc levels:   T12-L1: Mild bilateral facet degenerative change. Eccentric right disc bulge. Mild spinal canal narrowing. No neural foraminal narrowing.   L1-L2: Mild bilateral facet degenerative change. No significant disc bulge. No spinal canal narrowing. No neural foraminal narrowing.   L2-L3: Mild bilateral facet degenerative change. No significant disc bulge. No spinal canal narrowing. Mild left neural foraminal narrowing.   L3-L4: Mild bilateral facet degenerative change. Circumferential disc bulge. Mild spinal canal narrowing. Moderate bilateral neural foraminal narrowing.   L4-L5: Moderate bilateral facet degenerative change. Circumferential disc bulge. Retrolisthesis of L4 on L5. Severe bilateral neural foraminal narrowing, right-greater-than-left.   L5-S1: Moderate left and mild right facet degenerative change. Minimal disc bulge. No spinal canal narrowing. Moderate left and severe right neural foraminal narrowing.   IMPRESSION: Lower lumbar spine predominant degenerative changes with severe neural foraminal narrowing at L4-L5 (bilateral) and L5-S1 (right). No evidence of high-grade spinal canal stenosis.     Electronically Signed   By: Lorenza Cambridge M.D.   On: 09/02/2022 15:32   He reports that he quit smoking about 37 years ago. His smoking use included cigarettes. He started smoking about 57 years ago. He has a 20 pack-year smoking history. He has never used smokeless tobacco. No results for input(s): "HGBA1C", "LABURIC" in the last 8760 hours.  Objective:  VS:  HT:    WT:   BMI:     BP:(!) 146/72  HR:(!) 56bpm  TEMP: ( )  RESP:  Physical Exam Vitals and nursing note reviewed.  HENT:     Head: Normocephalic and atraumatic.     Right Ear: External ear normal.     Left Ear: External ear normal.     Nose: Nose normal.     Mouth/Throat:     Mouth: Mucous membranes are moist.  Eyes:     Extraocular Movements: Extraocular  movements intact.  Cardiovascular:     Rate and Rhythm: Normal rate.     Pulses: Normal pulses.  Pulmonary:     Effort: Pulmonary effort is normal.  Abdominal:     General: Abdomen is flat. There is no distension.  Musculoskeletal:        General: Tenderness present.     Cervical back: Normal range of motion.     Comments: Patient rises from seated position to standing without difficulty. Pain noted with facet loading. 5/5 strength noted with bilateral hip flexion and knee flexion/extension. EHL bilaterally 4/5, dorsiflexion 4/5 on the left.  No clonus noted bilaterally. No pain upon palpation of greater trochanters. No pain with internal/external rotation of bilateral hips. Sensation intact bilaterally. Negative slump test bilaterally. Ambulates with cane, gait slow and unsteady.   Skin:    General: Skin is warm and dry.     Capillary Refill: Capillary refill takes less than 2 seconds.  Neurological:     Mental Status: He is alert and oriented to person, place, and time.     Gait: Gait abnormal.  Psychiatric:        Mood and Affect: Mood normal.        Behavior: Behavior normal.     Ortho Exam  Imaging: No results found.  Past Medical/Family/Surgical/Social History: Medications & Allergies reviewed per EMR, new medications updated. Patient Active Problem List   Diagnosis Date Noted   Symptomatic sinus bradycardia 10/11/2022   Postural dizziness with presyncope 10/10/2022   Sinus bradycardia 10/10/2022   Syncope and collapse 02/21/2021   Nausea vomiting and diarrhea 02/21/2021   Abnormal chest x-ray 02/21/2021   DNR (do not resuscitate) 02/21/2021   Dyslipidemia 02/21/2021   SIRS (systemic inflammatory response syndrome) (HCC) 05/30/2014   Blood in stool 05/28/2014   Dehydration 05/28/2014   Clostridium difficile colitis 05/28/2014   Acute kidney injury (HCC) 05/28/2014   Abdominal pain    PVD (peripheral vascular disease) (HCC) 10/25/2013   Seroma, infected,  postoperative 06/10/2013   Pain in limb-Left Medial thigh 06/03/2013   Post op infection-Left Medial Thigh 06/03/2013   Atherosclerosis of native arteries of the extremities with ulceration (HCC) 01/04/2013   Peripheral vascular disease (HCC) 11/09/2012   Atherosclerosis of native artery of extremity with intermittent claudication (HCC) 10/19/2012   HEMATOMA 09/06/2009   WEIGHT LOSS-ABNORMAL 05/04/2009   ABDOMINAL PAIN -GENERALIZED 05/04/2009   CHEST PAIN 10/26/2007   COLONIC POLYPS 06/09/2007   Hypothyroidism 06/09/2007   DEPRESSION 06/09/2007   Essential hypertension 06/09/2007   RESIDUAL HEMORRHOIDAL SKIN TAGS 06/09/2007   GERD 06/09/2007   BARRETTS ESOPHAGUS 06/09/2007   HIATAL HERNIA 06/09/2007   DIVERTICULOSIS, COLON 06/09/2007   ARTHRITIS 06/09/2007   Past Medical History:  Diagnosis Date   Anemia    pt denies this   Anxiety    Arthritis    "legs and feet" (06/10/2013)   Barrett's esophagus    "never dilated" (06/10/2013)   Cataract    Chronic kidney disease    stage 3   Colon polyps    tubular adenoma-last colon 12/18/2005   Depression    Diverticulosis    GERD (gastroesophageal reflux disease)    takes omeprazole   Hematoma    on mid back   Hiatal hernia    High cholesterol    Hypertension    Hypothyroidism    takes synthroid   Peripheral vascular disease (HCC)    Family History  Problem Relation Age of Onset   Heart disease Mother    Lung cancer Brother        and sister   Melanoma Father    Alzheimer's disease Sister        x 2   Heart disease Sister    Hypertension Sister    Colon cancer Neg Hx    Colon polyps Neg Hx    Rectal cancer Neg Hx    Pancreatic cancer Neg Hx    Stomach cancer Neg Hx    Esophageal cancer Neg Hx    Past Surgical History:  Procedure Laterality Date   CARDIAC CATHETERIZATION     CATARACT EXTRACTION W/ INTRAOCULAR LENS  IMPLANT, BILATERAL Bilateral    COLONOSCOPY     EYE SURGERY     FEMORAL-POPLITEAL BYPASS GRAFT  Left 11/27/2012   Procedure: BYPASS GRAFT LEFT ABOVE KNEE TO BELOW KNEE POPLITEAL ARTERY USING LEFT NONREVERSED GREATER SAPPHENOUS VEIN;  Surgeon: Nada Libman, MD;  Location: [facility] OR;  Service: Vascular;  Laterality: Left;   I & D EXTREMITY Left 06/10/2013   leg; "from the OR in 11/2012"   I & D EXTREMITY Left 06/10/2013   Procedure: IRRIGATION AND DEBRIDEMENT OF LEFT UPPER LEG;  Surgeon: Nada Libman, MD;  Location: [facility] OR;  Service: Vascular;  Laterality: Left;  LOWER EXTREMITY ANGIOGRAM N/A 10/01/2012   Procedure: LOWER EXTREMITY ANGIOGRAM;  Surgeon: Corky Crafts, MD;  Location: [facility] CATH LAB;  Service: Cardiovascular;  Laterality: N/A;   POLYPECTOMY     stomach   PYLOROPLASTY     TONSILLECTOMY     TRANSURETHRAL RESECTION OF PROSTATE     UPPER GASTROINTESTINAL ENDOSCOPY     Social History   Occupational History   Occupation: Retired  Tobacco Use   Smoking status: Former    Current packs/day: 0.00    Average packs/day: 1 pack/day for 20.0 years (20.0 ttl pk-yrs)    Types: Cigarettes    Start date: 06/28/1965    Quit date: 06/28/1985    Years since quitting: 37.4   Smokeless tobacco: Never  Vaping Use   Vaping status: Never Used  Substance and Sexual Activity   Alcohol use: No    Comment: 06/10/2013 "drank recreationally when I did drink; stopped ~ 2010"   Drug use: No   Sexual activity: Never

## 2022-09-05 NOTE — Telephone Encounter (Signed)
Patient called needing to schedule an appointment with Dr. Alvester Morin for his back. The number to contact patient is [phone]

## 2022-09-05 NOTE — Telephone Encounter (Signed)
Scheduled

## 2022-09-23 ENCOUNTER — Ambulatory Visit: Payer: Medicare Other | Admitting: Physical Medicine and Rehabilitation

## 2022-09-23 ENCOUNTER — Other Ambulatory Visit: Payer: Self-pay

## 2022-09-23 DIAGNOSIS — M47816 Spondylosis without myelopathy or radiculopathy, lumbar region: Secondary | ICD-10-CM

## 2022-09-23 MED ORDER — METHYLPREDNISOLONE ACETATE 80 MG/ML IJ SUSP
80.0000 mg | Freq: Once | INTRAMUSCULAR | Status: AC
  Administered 2022-09-23: 80 mg

## 2022-09-23 NOTE — Progress Notes (Signed)
Functional Pain Scale - descriptive words and definitions  Moderate (4)   Constantly aware of pain, can complete ADLs with modification/sleep marginally affected at times/passive distraction is of no use, but active distraction gives some relief. Moderate range order  LBP hurts when walking. The further the walk, the more pain hits.  Short distances.  Average Pain 4   +Driver, -BT, -Dye Allergies.

## 2022-09-23 NOTE — Patient Instructions (Signed)
[facility] Physiatry Discharge Instructions  *At any time if you have questions or concerns they can be answered by calling [phone]  All Patients: You may experience an increase in your symptoms for the first 2 days (it can take 2 days to 2 weeks for the steroid/cortisone to have its maximal effect). You may use ice to the site for the first 24 hours; 20 minutes on and 20 minutes off and may use heat after that time. You may resume and continue your current pain medications. If you need a refill please contact the prescribing physician. You may resume your medications if any were stopped for the procedure. You may shower but no swimming, tub bath or Jacuzzi for 24 hours. Please remove bandage after 4 hours. You may resume light activities as tolerated. If you had Spine Injection, you should not drive for the next 3 hours due to anesthetics used in the procedure. Please have someone drive for you.  *If you have had sedation, Valium, Xanax, or lorazepam: Do not drive or use public transportation for 24 hours, do not operating hazardous machinery or make important personal/business decisions for 24 hours.  POSSIBLE STEROID SIDE EFFECTS: If experienced these should only last for a short period. Change in menstrual flow  Edema in (swelling)  Increased appetite Skin flushing (redness)  Skin rash/acne  Thrush (oral) Vaginitis    Increased sweating  Depression Increased blood glucose levels Cramping and leg/calf  Euphoria (feeling happy)  POSSIBLE PROCEDURE SIDE EFFECTS: Please call our office if concerned. Increased pain Increased numbness/tingling  Headache Nausea/vomiting Hematoma (bruising/bleeding) Edema (swelling at the site) Weakness  Infection (red/drainage at site) Fever greater than 100.19F  *In the event of a headache after epidural steroid injection: Drink plenty of fluids, especially water and try to lay flat when possible. If the headache does not get better after a few days  or as always if concerned please call the office.

## 2022-09-30 NOTE — Progress Notes (Signed)
SADIE ZANDER - 89 y.o. male MRN [id number]  Date of birth: 04-24-1933  Office Visit Note: Visit Date: 09/23/2022 PCP: Clovis Riley, L.August Saucer, MD Referred by: Clovis Riley, L.August Saucer, MD  Subjective: Chief Complaint  Patient presents with   Lower Back - Pain   HPI:  ISAEL MIKLAS is a 89 y.o. male who comes in today at the request of Ellin Goodie, FNP for planned Bilateral  L4-5 Lumbar facet/medial branch block with fluoroscopic guidance.  The patient has failed conservative care including home exercise, medications, time and activity modification.  This injection will be diagnostic and hopefully therapeutic.  Please see requesting physician notes for further details and justification.  Exam has shown concordant pain with facet joint loading.   ROS Otherwise per HPI.  Assessment & Plan: Visit Diagnoses:    ICD-10-CM   1. Spondylosis without myelopathy or radiculopathy, lumbar region  M47.816 XR C-ARM NO REPORT    Facet Injection    methylPREDNISolone acetate (DEPO-MEDROL) injection 80 mg      Plan: No additional findings.   Meds & Orders:  Meds ordered this encounter  Medications   methylPREDNISolone acetate (DEPO-MEDROL) injection 80 mg    Orders Placed This Encounter  Procedures   Facet Injection   XR C-ARM NO REPORT    Follow-up: Return for visit to requesting provider as needed.   Procedures: No procedures performed  Lumbar Facet Joint Intra-Articular Injection(s) with Fluoroscopic Guidance  Patient: ZERRICK URWIN      Date of Birth: 1933-05-25 MRN: [id number] PCP: Clovis Riley, L.August Saucer, MD      Visit Date: 09/23/2022   Universal Protocol:    Date/Time: 09/23/2022  Consent Given By: the patient  Position: PRONE   Additional Comments: Vital signs were monitored before and after the procedure. Patient was prepped and draped in the usual sterile fashion. The correct patient, procedure, and site was verified.   Injection Procedure Details:  Procedure Site One Meds  Administered:  Meds ordered this encounter  Medications   methylPREDNISolone acetate (DEPO-MEDROL) injection 80 mg     Laterality: Bilateral  Location/Site:  L4-L5  Needle size: 22 guage  Needle type: Spinal  Needle Placement: Articular  Findings:  -Comments: Excellent flow of contrast producing a partial arthrogram.  Procedure Details: The fluoroscope beam is vertically oriented in AP, and the inferior recess is visualized beneath the lower pole of the inferior apophyseal process, which represents the target point for needle insertion. When direct visualization is difficult the target point is located at the medial projection of the vertebral pedicle. The region overlying each aforementioned target is locally anesthetized with a 1 to 2 ml. volume of 1% Lidocaine without Epinephrine.   The spinal needle was inserted into each of the above mentioned facet joints using biplanar fluoroscopic guidance. A 0.25 to 0.5 ml. volume of Isovue-250 was injected and a partial facet joint arthrogram was obtained. A single spot film was obtained of the resulting arthrogram.    One to 1.25 ml of the steroid/anesthetic solution was then injected into each of the facet joints noted above.   Additional Comments:  No complications occurred Dressing: 2 x 2 sterile gauze and Band-Aid    Post-procedure details: Patient was observed during the procedure. Post-procedure instructions were reviewed.  Patient left the clinic in stable condition.    Clinical History: CLINICAL DATA:  MR lumbar spine eval for source of right radicular leg pain   EXAM: MRI LUMBAR SPINE WITHOUT CONTRAST   TECHNIQUE: Multiplanar, multisequence MR imaging  of the lumbar spine was performed. No intravenous contrast was administered.   COMPARISON:  CT L Spine 02/21/21   FINDINGS: Segmentation:  Standard.   Alignment: Stepwise retrolisthesis of L3 on L4, L4 on L5, and L5 on S1.   Vertebrae:  No fracture, evidence of  discitis, or bone lesion.   Conus medullaris and cauda equina: Conus extends to the L2 level. Conus and cauda equina appear normal.   Paraspinal and other soft tissues: There are small bilateral T2 hyperintense renal lesions which are too small to accurately characterize.   Disc levels:   T12-L1: Mild bilateral facet degenerative change. Eccentric right disc bulge. Mild spinal canal narrowing. No neural foraminal narrowing.   L1-L2: Mild bilateral facet degenerative change. No significant disc bulge. No spinal canal narrowing. No neural foraminal narrowing.   L2-L3: Mild bilateral facet degenerative change. No significant disc bulge. No spinal canal narrowing. Mild left neural foraminal narrowing.   L3-L4: Mild bilateral facet degenerative change. Circumferential disc bulge. Mild spinal canal narrowing. Moderate bilateral neural foraminal narrowing.   L4-L5: Moderate bilateral facet degenerative change. Circumferential disc bulge. Retrolisthesis of L4 on L5. Severe bilateral neural foraminal narrowing, right-greater-than-left.   L5-S1: Moderate left and mild right facet degenerative change. Minimal disc bulge. No spinal canal narrowing. Moderate left and severe right neural foraminal narrowing.   IMPRESSION: Lower lumbar spine predominant degenerative changes with severe neural foraminal narrowing at L4-L5 (bilateral) and L5-S1 (right). No evidence of high-grade spinal canal stenosis.     Electronically Signed   By: Lorenza Cambridge M.D.   On: 09/02/2022 15:32     Objective:  VS:  HT:    WT:   BMI:     BP:   HR: bpm  TEMP: ( )  RESP:  Physical Exam Vitals and nursing note reviewed.  Constitutional:      General: He is not in acute distress.    Appearance: Normal appearance. He is not ill-appearing.  HENT:     Head: Normocephalic and atraumatic.     Right Ear: External ear normal.     Left Ear: External ear normal.     Nose: No congestion.  Eyes:      Extraocular Movements: Extraocular movements intact.  Cardiovascular:     Rate and Rhythm: Normal rate.     Pulses: Normal pulses.  Pulmonary:     Effort: Pulmonary effort is normal. No respiratory distress.  Abdominal:     General: There is no distension.     Palpations: Abdomen is soft.  Musculoskeletal:        General: No tenderness or signs of injury.     Cervical back: Neck supple.     Right lower leg: No edema.     Left lower leg: No edema.     Comments: Patient has good distal strength without clonus.  Skin:    Findings: No erythema or rash.  Neurological:     General: No focal deficit present.     Mental Status: He is alert and oriented to person, place, and time.     Sensory: No sensory deficit.     Motor: No weakness or abnormal muscle tone.     Coordination: Coordination normal.  Psychiatric:        Mood and Affect: Mood normal.        Behavior: Behavior normal.      Imaging: No results found.

## 2022-09-30 NOTE — Procedures (Signed)
Lumbar Facet Joint Intra-Articular Injection(s) with Fluoroscopic Guidance  Patient: Walter [name]      Date of Birth: November 24, 1933 MRN: [id number] PCP: Clovis Riley, L.August Saucer, MD      Visit Date: 09/23/2022   Universal Protocol:    Date/Time: 09/23/2022  Consent Given By: the patient  Position: PRONE   Additional Comments: Vital signs were monitored before and after the procedure. Patient was prepped and draped in the usual sterile fashion. The correct patient, procedure, and site was verified.   Injection Procedure Details:  Procedure Site One Meds Administered:  Meds ordered this encounter  Medications   methylPREDNISolone acetate (DEPO-MEDROL) injection 80 mg     Laterality: Bilateral  Location/Site:  L4-L5  Needle size: 22 guage  Needle type: Spinal  Needle Placement: Articular  Findings:  -Comments: Excellent flow of contrast producing a partial arthrogram.  Procedure Details: The fluoroscope beam is vertically oriented in AP, and the inferior recess is visualized beneath the lower pole of the inferior apophyseal process, which represents the target point for needle insertion. When direct visualization is difficult the target point is located at the medial projection of the vertebral pedicle. The region overlying each aforementioned target is locally anesthetized with a 1 to 2 ml. volume of 1% Lidocaine without Epinephrine.   The spinal needle was inserted into each of the above mentioned facet joints using biplanar fluoroscopic guidance. A 0.25 to 0.5 ml. volume of Isovue-250 was injected and a partial facet joint arthrogram was obtained. A single spot film was obtained of the resulting arthrogram.    One to 1.25 ml of the steroid/anesthetic solution was then injected into each of the facet joints noted above.   Additional Comments:  No complications occurred Dressing: 2 x 2 sterile gauze and Band-Aid    Post-procedure details: Patient was observed during the  procedure. Post-procedure instructions were reviewed.  Patient left the clinic in stable condition.

## 2022-10-08 ENCOUNTER — Telehealth: Payer: Self-pay | Admitting: Physical Medicine and Rehabilitation

## 2022-10-08 DIAGNOSIS — M47816 Spondylosis without myelopathy or radiculopathy, lumbar region: Secondary | ICD-10-CM

## 2022-10-08 NOTE — Addendum Note (Signed)
Addended by: Sharlet Salina on: 10/08/2022 04:18 PM   Modules accepted: Orders

## 2022-10-08 NOTE — Telephone Encounter (Signed)
Patient called. Says the injection did not help. Cb# [phone]

## 2022-10-08 NOTE — Telephone Encounter (Signed)
Order for L4-5 IL placed

## 2022-10-09 ENCOUNTER — Telehealth: Payer: Self-pay | Admitting: Interventional Cardiology

## 2022-10-09 NOTE — Telephone Encounter (Signed)
Patient Name: Walter [name]  DOB: 04/22/1933 MRN: [id number]  Primary Cardiologist: None  Chart reviewed as part of pre-operative protocol coverage.   Patient has not been seen by our office since 2017 and is currently managed by [facility] for PVD.  Please contact prescribing provider for guidance on holding Plavix.   Napoleon Form, Leodis Rains, NP 10/09/2022, 10:57 AM

## 2022-10-09 NOTE — Telephone Encounter (Signed)
I will fax notes to requesting office to see notes from pre op APP. Pt has not been seen since 2017 with our practice. If going to need cardiac clearance as well, the pt will then need new pt appt. Otherwise pre pre op APP we do not manage Plavix. If needing only clearance for blood thinner will need to reach out to prescribing provider for Plavix.

## 2022-10-09 NOTE — Telephone Encounter (Signed)
Pre-operative Risk Assessment    Patient Name: Walter [name]  DOB: 10-28-1933 MRN: [id number]  LAST VISIT 03/22/2014 NEXT VISIT None     Request for Surgical Clearance    Procedure:   Epidural steroid injection  Date of Surgery:  Clearance TBD                                 Surgeon:  Dr. Alvester Morin  Surgeon's Group or Practice Name:  [facility] number:  [phone] Fax number:  [phone]   Type of Clearance Requested:   - Medical    Type of Anesthesia:  Not Indicated   Additional requests/questions:   Provider is asking to discontinue Plavix 7 days prior to procedure.  Signed, Royann Shivers   10/09/2022, 10:38 AM

## 2022-10-10 ENCOUNTER — Emergency Department (HOSPITAL_COMMUNITY): Payer: Medicare Other

## 2022-10-10 ENCOUNTER — Observation Stay (HOSPITAL_COMMUNITY)
Admission: EM | Admit: 2022-10-10 | Discharge: 2022-10-11 | Disposition: A | Discharge status: Home / Self Care | Payer: Medicare Other | Attending: Internal Medicine | Admitting: Internal Medicine

## 2022-10-10 ENCOUNTER — Other Ambulatory Visit: Payer: Self-pay

## 2022-10-10 ENCOUNTER — Encounter (HOSPITAL_COMMUNITY): Payer: Self-pay

## 2022-10-10 DIAGNOSIS — N183 Chronic kidney disease, stage 3 unspecified: Secondary | ICD-10-CM | POA: Insufficient documentation

## 2022-10-10 DIAGNOSIS — Z87891 Personal history of nicotine dependence: Secondary | ICD-10-CM | POA: Insufficient documentation

## 2022-10-10 DIAGNOSIS — E039 Hypothyroidism, unspecified: Secondary | ICD-10-CM | POA: Diagnosis present

## 2022-10-10 DIAGNOSIS — R001 Bradycardia, unspecified: Secondary | ICD-10-CM | POA: Diagnosis present

## 2022-10-10 DIAGNOSIS — R2689 Other abnormalities of gait and mobility: Secondary | ICD-10-CM | POA: Diagnosis not present

## 2022-10-10 DIAGNOSIS — R42 Dizziness and giddiness: Secondary | ICD-10-CM

## 2022-10-10 DIAGNOSIS — Z7902 Long term (current) use of antithrombotics/antiplatelets: Secondary | ICD-10-CM | POA: Insufficient documentation

## 2022-10-10 DIAGNOSIS — E871 Hypo-osmolality and hyponatremia: Secondary | ICD-10-CM | POA: Insufficient documentation

## 2022-10-10 DIAGNOSIS — R55 Syncope and collapse: Secondary | ICD-10-CM | POA: Diagnosis not present

## 2022-10-10 DIAGNOSIS — R531 Weakness: Secondary | ICD-10-CM | POA: Diagnosis not present

## 2022-10-10 DIAGNOSIS — R29818 Other symptoms and signs involving the nervous system: Secondary | ICD-10-CM | POA: Diagnosis not present

## 2022-10-10 DIAGNOSIS — K219 Gastro-esophageal reflux disease without esophagitis: Secondary | ICD-10-CM | POA: Diagnosis present

## 2022-10-10 DIAGNOSIS — I4891 Unspecified atrial fibrillation: Principal | ICD-10-CM

## 2022-10-10 DIAGNOSIS — Z66 Do not resuscitate: Secondary | ICD-10-CM | POA: Diagnosis not present

## 2022-10-10 DIAGNOSIS — I672 Cerebral atherosclerosis: Secondary | ICD-10-CM | POA: Diagnosis not present

## 2022-10-10 DIAGNOSIS — N179 Acute kidney failure, unspecified: Secondary | ICD-10-CM | POA: Insufficient documentation

## 2022-10-10 DIAGNOSIS — R6889 Other general symptoms and signs: Secondary | ICD-10-CM | POA: Diagnosis not present

## 2022-10-10 DIAGNOSIS — I499 Cardiac arrhythmia, unspecified: Secondary | ICD-10-CM | POA: Diagnosis not present

## 2022-10-10 DIAGNOSIS — R079 Chest pain, unspecified: Secondary | ICD-10-CM | POA: Diagnosis not present

## 2022-10-10 DIAGNOSIS — I739 Peripheral vascular disease, unspecified: Secondary | ICD-10-CM | POA: Diagnosis present

## 2022-10-10 DIAGNOSIS — I129 Hypertensive chronic kidney disease with stage 1 through stage 4 chronic kidney disease, or unspecified chronic kidney disease: Secondary | ICD-10-CM | POA: Insufficient documentation

## 2022-10-10 DIAGNOSIS — Z79899 Other long term (current) drug therapy: Secondary | ICD-10-CM | POA: Diagnosis not present

## 2022-10-10 DIAGNOSIS — Z743 Need for continuous supervision: Secondary | ICD-10-CM | POA: Diagnosis not present

## 2022-10-10 DIAGNOSIS — I10 Essential (primary) hypertension: Secondary | ICD-10-CM | POA: Diagnosis present

## 2022-10-10 DIAGNOSIS — I4430 Unspecified atrioventricular block: Secondary | ICD-10-CM | POA: Diagnosis not present

## 2022-10-10 DIAGNOSIS — E785 Hyperlipidemia, unspecified: Secondary | ICD-10-CM | POA: Diagnosis present

## 2022-10-10 LAB — CBC
HCT: 40.4 % (ref 39.0–52.0)
HCT: 41.3 % (ref 39.0–52.0)
Hemoglobin: 13.5 g/dL (ref 13.0–17.0)
Hemoglobin: 13.7 g/dL (ref 13.0–17.0)
MCH: 31.1 pg (ref 26.0–34.0)
MCH: 30.9 pg (ref 26.0–34.0)
MCHC: 33.4 g/dL (ref 30.0–36.0)
MCHC: 33.2 g/dL (ref 30.0–36.0)
MCV: 93.1 fL (ref 80.0–100.0)
MCV: 93.2 fL (ref 80.0–100.0)
Platelets: 197 10*3/uL (ref 150–400)
Platelets: 189 10*3/uL (ref 150–400)
RBC: 4.34 MIL/uL (ref 4.22–5.81)
RBC: 4.43 MIL/uL (ref 4.22–5.81)
RDW: 14.3 % (ref 11.5–15.5)
RDW: 14.5 % (ref 11.5–15.5)
WBC: 8.8 10*3/uL (ref 4.0–10.5)
WBC: 8.9 10*3/uL (ref 4.0–10.5)
nRBC: 0 % (ref 0.0–0.2)
nRBC: 0 % (ref 0.0–0.2)

## 2022-10-10 LAB — BASIC METABOLIC PANEL
Anion gap: 8 (ref 5–15)
BUN: 28 mg/dL — ABNORMAL HIGH (ref 8–23)
CO2: 24 mmol/L (ref 22–32)
Calcium: 8.8 mg/dL — ABNORMAL LOW (ref 8.9–10.3)
Chloride: 102 mmol/L (ref 98–111)
Creatinine, Ser: 1.2 mg/dL (ref 0.61–1.24)
GFR, Estimated: 59 mL/min — ABNORMAL LOW (ref >60)
Glucose, Bld: 123 mg/dL — ABNORMAL HIGH (ref 70–99)
Potassium: 4.6 mmol/L (ref 3.5–5.1)
Sodium: 134 mmol/L — ABNORMAL LOW (ref 135–145)

## 2022-10-10 LAB — D-DIMER, QUANTITATIVE: D-Dimer, Quant: 0.46 ug{FEU}/mL (ref 0.00–0.50)

## 2022-10-10 LAB — CREATININE, SERUM
Creatinine, Ser: 1.32 mg/dL — ABNORMAL HIGH (ref 0.61–1.24)
GFR, Estimated: 53 mL/min — ABNORMAL LOW (ref >60)

## 2022-10-10 LAB — TROPONIN I (HIGH SENSITIVITY)
Troponin I (High Sensitivity): 12 ng/L (ref <18)
Troponin I (High Sensitivity): 16 ng/L (ref <18)

## 2022-10-10 LAB — TSH
TSH: 0.552 u[IU]/mL (ref 0.350–4.500)
TSH: 0.731 u[IU]/mL (ref 0.350–4.500)

## 2022-10-10 LAB — T4, FREE: Free T4: 1.1 ng/dL (ref 0.61–1.12)

## 2022-10-10 LAB — MAGNESIUM: Magnesium: 2.1 mg/dL (ref 1.7–2.4)

## 2022-10-10 MED ORDER — SODIUM CHLORIDE 0.9% FLUSH
3.0000 mL | Freq: Two times a day (BID) | INTRAVENOUS | Status: DC
  Administered 2022-10-10: 3 mL via INTRAVENOUS

## 2022-10-10 MED ORDER — PANTOPRAZOLE SODIUM 40 MG PO TBEC
40.0000 mg | DELAYED_RELEASE_TABLET | Freq: Every day | ORAL | Status: DC
  Administered 2022-10-10: 40 mg via ORAL
  Filled 2022-10-10 (×2): qty 1

## 2022-10-10 MED ORDER — CLOPIDOGREL BISULFATE 75 MG PO TABS
75.0000 mg | ORAL_TABLET | Freq: Every day | ORAL | Status: DC
  Administered 2022-10-10: 75 mg via ORAL
  Filled 2022-10-10 (×2): qty 1

## 2022-10-10 MED ORDER — BOOST HIGH PROTEIN PO LIQD
1.0000 | Freq: Two times a day (BID) | ORAL | Status: DC
  Filled 2022-10-10 (×2): qty 237

## 2022-10-10 MED ORDER — ROSUVASTATIN CALCIUM 5 MG PO TABS
10.0000 mg | ORAL_TABLET | Freq: Every day | ORAL | Status: DC
  Administered 2022-10-10: 10 mg via ORAL
  Filled 2022-10-10 (×2): qty 2

## 2022-10-10 MED ORDER — LEVOTHYROXINE SODIUM 25 MCG PO TABS
125.0000 ug | ORAL_TABLET | Freq: Every day | ORAL | Status: DC
  Administered 2022-10-10: 125 ug via ORAL
  Filled 2022-10-10: qty 1

## 2022-10-10 MED ORDER — OMEGA 3-6-9 COMPLEX PO CAPS: ORAL_CAPSULE | Freq: Every day | ORAL | Status: DC

## 2022-10-10 MED ORDER — ACETAMINOPHEN 500 MG PO TABS
1000.0000 mg | ORAL_TABLET | Freq: Every day | ORAL | Status: DC
  Administered 2022-10-10: 1000 mg via ORAL
  Filled 2022-10-10: qty 2

## 2022-10-10 MED ORDER — ONDANSETRON HCL 4 MG PO TABS: 4.0000 mg | ORAL_TABLET | Freq: Four times a day (QID) | ORAL | Status: DC | PRN

## 2022-10-10 MED ORDER — ENOXAPARIN SODIUM 40 MG/0.4ML IJ SOSY
40.0000 mg | PREFILLED_SYRINGE | INTRAMUSCULAR | Status: DC
  Administered 2022-10-10: 40 mg via SUBCUTANEOUS
  Filled 2022-10-10: qty 0.4

## 2022-10-10 MED ORDER — ENSURE ENLIVE PO LIQD
237.0000 mL | Freq: Two times a day (BID) | ORAL | Status: DC
  Administered 2022-10-11: 237 mL via ORAL

## 2022-10-10 MED ORDER — ONDANSETRON HCL 4 MG/2ML IJ SOLN: 4.0000 mg | Freq: Four times a day (QID) | INTRAMUSCULAR | Status: DC | PRN

## 2022-10-10 NOTE — ED Provider Notes (Signed)
This is a 89 year old male history of peripheral to doctor's, hypertension, GERD, CKD.  Patient had episode of sudden onset weakness generalized, shortness of breath, difficulty walking with some chest pain earlier today.  He lives alone.  Did not pass out but felt like he was going to especially when he stood.  EMS rhythm strip concerning for atrial fibrillation versus atrial flutter,'s been sinus bradycardia here.  Troponin negative x 2, D-dimer unremarkable.  Labs overall reassuring.  CT had already obtained which does not show any signs of acute stroke.  My evaluation he still not feeling Hyper-Sal is better.  He is able to ambulate a little bit but is quite unsteady and still feels quite dizzy when he stands up.  I think he would benefit from admission for echo, continue observation and telemetry given episode of arrhythmia earlier.  Discussed with hospitalist and admitted.   Laurence Spates, MD 10/10/22 2308

## 2022-10-10 NOTE — ED Notes (Addendum)
Assumed care of patient, pt in NAD, pt is awake, A&O, family at bedside, pt requesting BOOST. Waiting for inpatient bed assignment. Call light within reach, bed in low position with brakes set, SR on monitor.

## 2022-10-10 NOTE — ED Notes (Signed)
ED TO INPATIENT HANDOFF REPORT  ED Nurse Name and Phone #: Fernande Boyden, RN [phone]  S Name/Age/Gender Walter [name] 89 y.o. male Room/Bed: 012C/012C  Code Status   Code Status: Prior  Home/SNF/Other Home Patient oriented to: self, place, time, and situation Is this baseline? Yes   Triage Complete: Triage complete (Simultaneous filing. User may not have seen previous data.)  Chief Complaint Postural dizziness with presyncope [R42, R55]  Triage Note Patient BIB [facility] from home for increasing exertional weakness, no accompanying SOB reported to EMS. Today woke up feeling very weak moving from room to room and had one episode of chest pain which resolved prior to EMS arrival. Patient is independent and lives alone, A&Ox4, ambulatory, VSS but tachy to 110s with any exertion.    Allergies Allergies  Allergen Reactions   Penicillins Other (See Comments)    Boils    Level of Care/Admitting Diagnosis ED Disposition     ED Disposition  Admit   Condition  --   Comment  Hospital Area: [facility] [[id number]]  Level of Care: Telemetry Cardiac [103]  May place patient in observation at [facility] or [facility] if equivalent level of care is available:: No  Covid Evaluation: Asymptomatic - no recent exposure (last 10 days) testing not required  Diagnosis: Postural dizziness with presyncope [[id number]]  Admitting Physician: Rometta Emery [[id number]]  Attending Physician: Rometta Emery [[id number]]          B Medical/Surgery History Past Medical History:  Diagnosis Date   Anemia    pt denies this   Anxiety    Arthritis    "legs and feet" (06/10/2013)   Barrett's esophagus    "never dilated" (06/10/2013)   Cataract    Chronic kidney disease    stage 3   Colon polyps    tubular adenoma-last colon 12/18/2005   Depression    Diverticulosis    GERD (gastroesophageal reflux disease)    takes omeprazole   Hematoma    on mid back   Hiatal hernia    High  cholesterol    Hypertension    Hypothyroidism    takes synthroid   Peripheral vascular disease (HCC)    Past Surgical History:  Procedure Laterality Date   CARDIAC CATHETERIZATION     CATARACT EXTRACTION W/ INTRAOCULAR LENS  IMPLANT, BILATERAL Bilateral    COLONOSCOPY     EYE SURGERY     FEMORAL-POPLITEAL BYPASS GRAFT Left 11/27/2012   Procedure: BYPASS GRAFT LEFT ABOVE KNEE TO BELOW KNEE POPLITEAL ARTERY USING LEFT NONREVERSED GREATER SAPPHENOUS VEIN;  Surgeon: Nada Libman, MD;  Location: [facility] OR;  Service: Vascular;  Laterality: Left;   I & D EXTREMITY Left 06/10/2013   leg; "from the OR in 11/2012"   I & D EXTREMITY Left 06/10/2013   Procedure: IRRIGATION AND DEBRIDEMENT OF LEFT UPPER LEG;  Surgeon: Nada Libman, MD;  Location[facility] OR;  Service: Vascular;  Laterality: Left;   LOWER EXTREMITY ANGIOGRAM N/A 10/01/2012   Procedure: LOWER EXTREMITY ANGIOGRAM;  Surgeon: Corky Crafts, MD;  Location: [facility] CATH LAB;  Service: Cardiovascular;  Laterality: N/A;   POLYPECTOMY     stomach   PYLOROPLASTY     TONSILLECTOMY     TRANSURETHRAL RESECTION OF PROSTATE     UPPER GASTROINTESTINAL ENDOSCOPY       A IV Location/Drains/Wounds Patient Lines/Drains/Airways Status     Active Line/Drains/Airways     Name Placement date Placement time Site Days  Peripheral IV 10/10/22 Anterior;Left Forearm 10/10/22  1157  Forearm  less than 1            Intake/Output Last 24 hours No intake or output data in the 24 hours ending 10/10/22 1901  Labs/Imaging Results for orders placed or performed during the hospital encounter of 10/10/22 (from the past 48 hour(s))  TSH     Status: None   Collection Time: 10/10/22 12:35 PM  Result Value Ref Range   TSH 0.552 0.350 - 4.500 uIU/mL    Comment: Performed by a 3rd Generation assay with a functional sensitivity of <=0.01 uIU/mL. Performed at [facility], [address] [place], [place] [address]   CBC     Status: None   Collection  Time: 10/10/22 12:35 PM  Result Value Ref Range   WBC 8.8 4.0 - 10.5 K/uL   RBC 4.34 4.22 - 5.81 MIL/uL   Hemoglobin 13.5 13.0 - 17.0 g/dL   HCT 82.9 56.2 - 13.0 %   MCV 93.1 80.0 - 100.0 fL   MCH 31.1 26.0 - 34.0 pg   MCHC 33.4 30.0 - 36.0 g/dL   RDW 86.5 78.4 - 69.6 %   Platelets 197 150 - 400 K/uL   nRBC 0.0 0.0 - 0.2 %    Comment: Performed at [facility], [address] [place], [place] [address]  Magnesium     Status: None   Collection Time: 10/10/22 12:35 PM  Result Value Ref Range   Magnesium 2.1 1.7 - 2.4 mg/dL    Comment: Performed at [facility], [address], [place], [place] [address]  Basic metabolic panel     Status: Abnormal   Collection Time: 10/10/22 12:35 PM  Result Value Ref Range   Sodium 134 (L) 135 - 145 mmol/L   Potassium 4.6 3.5 - 5.1 mmol/L   Chloride 102 98 - 111 mmol/L   CO2 24 22 - 32 mmol/L   Glucose, Bld 123 (H) 70 - 99 mg/dL    Comment: Glucose reference range applies only to samples taken after fasting for at least 8 hours.   BUN 28 (H) 8 - 23 mg/dL   Creatinine, Ser 4.01 0.61 - 1.24 mg/dL   Calcium 8.8 (L) 8.9 - 10.3 mg/dL   GFR, Estimated 59 (L) >60 mL/min    Comment: (NOTE) Calculated using the CKD-EPI Creatinine Equation (2021)    Anion gap 8 5 - 15    Comment: Performed at [facility], [address] [place], [place] [address]  Troponin I (High Sensitivity)     Status: None   Collection Time: 10/10/22 12:35 PM  Result Value Ref Range   Troponin I (High Sensitivity) 12 <18 ng/L    Comment: (NOTE) Elevated high sensitivity troponin I (hsTnI) values and significant  changes across serial measurements may suggest ACS but many other  chronic and acute conditions are known to elevate hsTnI results.  Refer to the "Links" section for chest pain algorithms and additional  guidance. Performed at [facility], [address] [place], [place] [address]   Troponin I (High Sensitivity)     Status: None    Collection Time: 10/10/22  2:26 PM  Result Value Ref Range   Troponin I (High Sensitivity) 16 <18 ng/L    Comment: (NOTE) Elevated high sensitivity troponin I (hsTnI) values and significant  changes across serial measurements may suggest ACS but many other  chronic and acute conditions are  known to elevate hsTnI results.  Refer to the "Links" section for chest pain algorithms and additional  guidance. Performed at [facility], [address] [place], [place] [address]   D-dimer, quantitative     Status: None   Collection Time: 10/10/22  2:26 PM  Result Value Ref Range   D-Dimer, Quant 0.46 0.00 - 0.50 ug/mL-FEU    Comment: (NOTE) At the manufacturer cut-off value of 0.5 g/mL FEU, this assay has a negative predictive value of 95-100%.This assay is intended for use in conjunction with a clinical pretest probability (PTP) assessment model to exclude pulmonary embolism (PE) and deep venous thrombosis (DVT) in outpatients suspected of PE or DVT. Results should be correlated with clinical presentation. Performed at [facility], [address] [place], [place] [address]    CT HEAD WO CONTRAST ( )  Result Date: 10/10/2022 CLINICAL DATA:  Weakness, neuro deficit, acute stroke suspected EXAM: CT HEAD WITHOUT CONTRAST TECHNIQUE: Contiguous axial images were obtained from the base of the skull through the vertex without intravenous contrast. RADIATION DOSE REDUCTION: This exam was performed according to the departmental dose-optimization program which includes automated exposure control, adjustment of the mA and/or kV according to patient size and/or use of iterative reconstruction technique. COMPARISON:  CT head 02/21/2021 FINDINGS: Brain: No intracranial hemorrhage, mass effect, or evidence of acute infarct. No hydrocephalus. No extra-axial fluid collection. Age-commensurate cerebral atrophy and chronic small vessel ischemic disease. Vascular: No hyperdense vessel. Intracranial  arterial calcification. Skull: No fracture or focal lesion. Sinuses/Orbits: No acute finding. Other: None. IMPRESSION: No acute intracranial abnormality. Electronically Signed   By: Minerva Fester M.D.   On: 10/10/2022 18:34   DG Chest Portable 1 View  Result Date: 10/10/2022 CLINICAL DATA:  Weakness EXAM: PORTABLE CHEST 1 VIEW COMPARISON:  Chest radiograph 02/21/2021 FINDINGS: The cardiomediastinal silhouette is normal. There is no focal consolidation or pulmonary edema. There is no pleural effusion or pneumothorax There is no acute osseous abnormality. IMPRESSION: No radiographic evidence of acute cardiopulmonary process. Electronically Signed   By: Lesia Hausen M.D.   On: 10/10/2022 14:57    Pending Labs Unresulted Labs (From admission, onward)    None       Vitals/Pain Today's Vitals   10/10/22 1200 10/10/22 1207 10/10/22 1415 10/10/22 1626  BP:   (!) 123/92   Pulse:   (!) 51   Resp:   11   Temp: 97.9 F (36.6 C)   97.8 F (36.6 C)  TempSrc: Oral   Oral  SpO2:   98%   Weight:      Height:      PainSc:  0-No pain      Isolation Precautions No active isolations  Medications Medications - No data to display  Mobility walks     Focused Assessments `   R Recommendations: See Admitting Provider Note  Report given to:   Additional Notes: Patient is usually independent, lives alone without help and visits wife in LTC facility. More weak lately with seemingly no reason, occasionally brady but no other specific reason for change. Admit for Afib as noticed on EKG.

## 2022-10-10 NOTE — ED Provider Notes (Signed)
[place] EMERGENCY DEPARTMENT AT [facility] Provider Note   CSN: [id number] Arrival date & time: 10/10/22  1149     History  Chief Complaint  Patient presents with   Weakness    Walter [name] is a 89 y.o. male.   Weakness Patient presented with weakness and shortness of breath.  Began this morning.  States he got up and went to the bathroom and then walked and became very short of breath.  Fatigue.  Did have some mild chest pain.  Lives alone.  States he was able to do fine yesterday.  States he was able to do work around the yard.  Has not had episodes like this before.  No swelling in his legs.  No cough. Reportedly had a significant tachycardia for EMS that that had resolved.   Past Medical History:  Diagnosis Date   Anemia    pt denies this   Anxiety    Arthritis    "legs and feet" (06/10/2013)   Barrett's esophagus    "never dilated" (06/10/2013)   Cataract    Chronic kidney disease    stage 3   Colon polyps    tubular adenoma-last colon 12/18/2005   Depression    Diverticulosis    GERD (gastroesophageal reflux disease)    takes omeprazole   Hematoma    on mid back   Hiatal hernia    High cholesterol    Hypertension    Hypothyroidism    takes synthroid   Peripheral vascular disease (HCC)     Home Medications Prior to Admission medications   Medication Sig Start Date End Date Taking? Authorizing Provider  acetaminophen (TYLENOL) 500 MG tablet Take 1,000 mg by mouth at bedtime.    [provider]  alum & mag hydroxide-simeth (MAALOX/MYLANTA) 200-200-20 MG/5ML suspension Take 15 mLs by mouth daily.    [provider]  clopidogrel (PLAVIX) 75 MG tablet Take 1 tablet (75 mg total) by mouth daily. 02/16/16   Corky Crafts, MD  feeding supplement (BOOST HIGH PROTEIN) LIQD Take 1 Container by mouth 2 (two) times daily between meals.    [provider]  levothyroxine (SYNTHROID, LEVOTHROID) 125 MCG tablet Take 125 mcg by  mouth at bedtime.    [provider]  midodrine (PROAMATINE) 2.5 MG tablet Take 1 tablet (2.5 mg total) by mouth 2 (two) times daily with a meal. Patient not taking: Reported on 07/29/2022 02/28/21   Azucena Fallen, MD  Omega 3-6-9 Fatty Acids (OMEGA 3-6-9 COMPLEX PO) Take 2 capsules by mouth daily.    [provider]  omeprazole (PRILOSEC) 40 MG capsule Take 1 capsule (40 mg total) by mouth daily. Office visit for further refills 06/07/22   Hilarie Fredrickson, MD  Probiotic Product (PROBIOTIC PO) Take by mouth daily. Colon support OTC    [provider]  rosuvastatin (CRESTOR) 10 MG tablet Take 10 mg by mouth daily.    [provider]      Allergies    Penicillins    Review of Systems   Review of Systems  Neurological:  Positive for weakness.    Physical Exam Updated Vital Signs BP (!) 123/92   Pulse (!) 51   Temp 97.9 F (36.6 C) (Oral)   Resp 11   Ht 5\' 9"  (1.753 m)   Wt 79.4 kg   SpO2 98%   BMI 25.84 kg/m  Physical Exam Vitals and nursing note reviewed.  HENT:     Head:  Normocephalic.  Cardiovascular:     Rate and Rhythm: Regular rhythm.  Chest:     Chest wall: No tenderness.  Abdominal:     Tenderness: There is no abdominal tenderness.  Musculoskeletal:        General: No tenderness.     Cervical back: Neck supple.  Skin:    General: Skin is warm.  Neurological:     Mental Status: He is alert and oriented to person, place, and time.     ED Results / Procedures / Treatments   Labs (all labs ordered are listed, but only abnormal results are displayed) Labs Reviewed  BASIC METABOLIC PANEL - Abnormal; Notable for the following components:      Result Value   Sodium 134 (*)    Glucose, Bld 123 (*)    BUN 28 (*)    Calcium 8.8 (*)    GFR, Estimated 59 (*)    All other components within normal limits  TSH  CBC  MAGNESIUM  D-DIMER, QUANTITATIVE  TROPONIN I (HIGH SENSITIVITY)  TROPONIN I (HIGH SENSITIVITY)    EKG EKG  Interpretation Date/Time:  Thursday October 10 2022 14:27:57 EDT Ventricular Rate:  51 PR Interval:    QRS Duration:  87 QT Interval:  434 QTC Calculation: 400 R Axis:   41  Text Interpretation: Sinus rhythm Confirmed by Benjiman Core [phone]) on 10/10/2022 2:32:54 PM     Radiology DG Chest Portable 1 View  Result Date: 10/10/2022 CLINICAL DATA:  Weakness EXAM: PORTABLE CHEST 1 VIEW COMPARISON:  Chest radiograph 02/21/2021 FINDINGS: The cardiomediastinal silhouette is normal. There is no focal consolidation or pulmonary edema. There is no pleural effusion or pneumothorax There is no acute osseous abnormality. IMPRESSION: No radiographic evidence of acute cardiopulmonary process. Electronically Signed   By: Lesia Hausen M.D.   On: 10/10/2022 14:57    Procedures Procedures    Medications Ordered in ED Medications - No data to display  ED Course/ Medical Decision Making/ A&P         CHA2DS2-VASc Score: 3                        Medical Decision Making Amount and/or Complexity of Data Reviewed Labs: ordered. Radiology: ordered.   Patient with shortness of breath and tachycardia.  Feeling somewhat better now but not ambulatory.  Differential diagnosis long includes arrhythmia.  EMS EKG does show a narrow complex tachycardia.  Potentially A-fib or flutter.  Now appears to be in sinus however.  Has not had history of arrhythmia.  Will get basic blood work.  Will get x-ray.  Reviewed cardiology note.  Blood work overall reassuring.  D-dimer negative.  Kidney function at baseline.  First troponin negative.  Chest x-ray reassuring.  However patient became lightheaded and was unable to ambulate.  Will now add head CT to rule out potential causes such as stroke.  Care will be turned over to Dr. Earlene Plater.        Final Clinical Impression(s) / ED Diagnoses Final diagnoses:  Atrial fibrillation, unspecified type [facility])    Rx / DC Orders ED Discharge Orders     None          Benjiman Core, MD 10/10/22 1536

## 2022-10-10 NOTE — H&P (Signed)
History and Physical    Patient: Walter [name] JWJ:[id number] DOB: May 01, 1933 DOA: 10/10/2022 DOS: the patient was seen and examined on 10/10/2022 PCP: Clovis Riley, L.August Saucer, MD  Patient coming from: Home  Chief Complaint:  Chief Complaint  Patient presents with   Weakness   HPI: KOBAIN [name] is a 89 y.o. male with medical history significant of hyperlipidemia, hypothyroidism, GERD, chronic kidney disease stage III, anxiety disorder, essential hypertension and peripheral vascular disease who presented to the ER with complaint of increasing exertional weakness, presyncopal feeling today after he woke up.  He was moving from room to room and had 1 episode of chest pain that resolved prior to arrival.  Initial vitals shows sinus tachycardia with a heart rate of 110s with any exertion.  This later dropped with heart rate in the 50s.  Patient reported that his heart rate has been as low as the 40s at some point.  He was found to be orthostatic.  He is not on any rate lowering medications but remain bradycardic.  Patient is admitted to mother's, for further evaluation and treatment  Review of Systems: As mentioned in the history of present illness. All other systems reviewed and are negative. Past Medical History:  Diagnosis Date   Anemia    pt denies this   Anxiety    Arthritis    "legs and feet" (06/10/2013)   Barrett's esophagus    "never dilated" (06/10/2013)   Cataract    Chronic kidney disease    stage 3   Colon polyps    tubular adenoma-last colon 12/18/2005   Depression    Diverticulosis    GERD (gastroesophageal reflux disease)    takes omeprazole   Hematoma    on mid back   Hiatal hernia    High cholesterol    Hypertension    Hypothyroidism    takes synthroid   Peripheral vascular disease (HCC)    Past Surgical History:  Procedure Laterality Date   CARDIAC CATHETERIZATION     CATARACT EXTRACTION W/ INTRAOCULAR LENS  IMPLANT, BILATERAL Bilateral    COLONOSCOPY     EYE  SURGERY     FEMORAL-POPLITEAL BYPASS GRAFT Left 11/27/2012   Procedure: BYPASS GRAFT LEFT ABOVE KNEE TO BELOW KNEE POPLITEAL ARTERY USING LEFT NONREVERSED GREATER SAPPHENOUS VEIN;  Surgeon: Nada Libman, MD;  Location: [facility] OR;  Service: Vascular;  Laterality: Left;   I & D EXTREMITY Left 06/10/2013   leg; "from the OR in 11/2012"   I & D EXTREMITY Left 06/10/2013   Procedure: IRRIGATION AND DEBRIDEMENT OF LEFT UPPER LEG;  Surgeon: Nada Libman, MD;  Location: [facility] OR;  Service: Vascular;  Laterality: Left;   LOWER EXTREMITY ANGIOGRAM N/A 10/01/2012   Procedure: LOWER EXTREMITY ANGIOGRAM;  Surgeon: Corky Crafts, MD;  Location: [facility] CATH LAB;  Service: Cardiovascular;  Laterality: N/A;   POLYPECTOMY     stomach   PYLOROPLASTY     TONSILLECTOMY     TRANSURETHRAL RESECTION OF PROSTATE     UPPER GASTROINTESTINAL ENDOSCOPY     Social History:  reports that he quit smoking about 37 years ago. His smoking use included cigarettes. He started smoking about 57 years ago. He has a 20 pack-year smoking history. He has never used smokeless tobacco. He reports that he does not drink alcohol and does not use drugs.  Allergies  Allergen Reactions   Penicillins Other (See Comments)    Boils    Family History  Problem Relation Age of Onset  Heart disease Mother    Lung cancer Brother        and sister   Melanoma Father    Alzheimer's disease Sister        x 2   Heart disease Sister    Hypertension Sister    Colon cancer Neg Hx    Colon polyps Neg Hx    Rectal cancer Neg Hx    Pancreatic cancer Neg Hx    Stomach cancer Neg Hx    Esophageal cancer Neg Hx     Prior to Admission medications   Medication Sig Start Date End Date Taking? Authorizing Provider  acetaminophen (TYLENOL) 500 MG tablet Take 1,000 mg by mouth at bedtime.   Yes [provider]  clopidogrel (PLAVIX) 75 MG tablet Take 1 tablet (75 mg total) by mouth daily. 02/16/16  Yes Corky Crafts, MD  feeding  supplement (BOOST HIGH PROTEIN) LIQD Take 1 Container by mouth 2 (two) times daily between meals.   Yes [provider]  levothyroxine (SYNTHROID, LEVOTHROID) 125 MCG tablet Take 125 mcg by mouth at bedtime.   Yes [provider]  Omega 3-6-9 Fatty Acids (OMEGA 3-6-9 COMPLEX PO) Take 2 capsules by mouth daily.   Yes [provider]  omeprazole (PRILOSEC) 40 MG capsule Take 1 capsule (40 mg total) by mouth daily. Office visit for further refills 06/07/22  Yes Hilarie Fredrickson, MD  rosuvastatin (CRESTOR) 10 MG tablet Take 10 mg by mouth daily.   Yes [provider]  alum & mag hydroxide-simeth (MAALOX/MYLANTA) 200-200-20 MG/5ML suspension Take 15 mLs by mouth daily. Patient not taking: Reported on 10/10/2022    [provider]    Physical Exam: Vitals:   10/10/22 1158 10/10/22 1200 10/10/22 1415 10/10/22 1626  BP:   (!) 123/92   Pulse:   (!) 51   Resp:   11   Temp:  97.9 F (36.6 C)  97.8 F (36.6 C)  TempSrc:  Oral  Oral  SpO2:   98%   Weight: 79.4 kg     Height: 5\' 9"  (1.753 m)      Constitutional: NAD, calm, comfortable Eyes: PERRL, lids and conjunctivae normal ENMT: Mucous membranes are moist. Posterior pharynx clear of any exudate or lesions.Normal dentition.  Neck: normal, supple, no masses, no thyromegaly Respiratory: clear to auscultation bilaterally, no wheezing, no crackles. Normal respiratory effort. No accessory muscle use.  Cardiovascular: Sinus bradycardia, no murmurs / rubs / gallops. No extremity edema. 2+ pedal pulses. No carotid bruits.  Abdomen: no tenderness, no masses palpated. No hepatosplenomegaly. Bowel sounds positive.  Musculoskeletal: Good range of motion, no joint swelling or tenderness, Skin: no rashes, lesions, ulcers. No induration Neurologic: CN 2-12 grossly intact. Sensation intact, DTR normal. Strength 5/5 in all 4.  Psychiatric: Normal judgment and insight. Alert and oriented x 3. Normal mood  Data  Reviewed:  Temperature 97.9, blood pressure 113/62, pulse 48 respiratory rate of 18 and oxygen sat 98% room air.  Sodium 134, BUN 28 creatinine 1.29 calcium 8.8 glucose 123.  Head CT without contrast showed no acute findings EKG showed sinus bradycardia with a rate of 54  Assessment and Plan:  #1 generalized weakness with presyncope: Patient admitted for observation.  Get echocardiogram.  Get PT and OT consult.  #2 sinus bradycardia: Patient also orthostatic.  Will evaluate thyroid levels.  This may be contributing.  Continue to monitor.  #3 hypothyroidism: Patient on levothyroxine.  Checking thyroid panel.  #4 mild AKI: Will hydrate.  Continue to monitor  #5 hyponatremia: Hydrate with some saline.  #6 GERD: Continue PPI  #7 essential hypertension: Continue monitoring blood pressure.  #8 peripheral vascular disease: Stable.    Advance Care Planning:   Code Status: Do not attempt resuscitation (DNR) PRE-ARREST INTERVENTIONS DESIRED   Consults: PT and OT consult  Family Communication: No family at bedside  Severity of Illness: The appropriate patient status for this patient is OBSERVATION. Observation status is judged to be reasonable and necessary in order to provide the required intensity of service to ensure the patient's safety. The patient's presenting symptoms, physical exam findings, and initial radiographic and laboratory data in the context of their medical condition is felt to place them at decreased risk for further clinical deterioration. Furthermore, it is anticipated that the patient will be medically stable for discharge from the hospital within 2 midnights of admission.   AuthorLonia Blood, MD 10/10/2022 8:11 PM  For on call review [email or web address].

## 2022-10-10 NOTE — ED Notes (Addendum)
Pt ambulated in room and O2 level dropped from 100 to 97 heart rate stayed the same pt became very light headed

## 2022-10-10 NOTE — ED Triage Notes (Signed)
Patient BIB [facility] from home for increasing exertional weakness, no accompanying SOB reported to EMS. Today woke up feeling very weak moving from room to room and had one episode of chest pain which resolved prior to EMS arrival. Patient is independent and lives alone, A&Ox4, ambulatory, VSS but tachy to 110s with any exertion.

## 2022-10-11 ENCOUNTER — Observation Stay (HOSPITAL_BASED_OUTPATIENT_CLINIC_OR_DEPARTMENT_OTHER): Payer: Medicare Other

## 2022-10-11 ENCOUNTER — Other Ambulatory Visit: Payer: Self-pay | Admitting: Student

## 2022-10-11 DIAGNOSIS — R001 Bradycardia, unspecified: Secondary | ICD-10-CM

## 2022-10-11 DIAGNOSIS — R42 Dizziness and giddiness: Secondary | ICD-10-CM | POA: Diagnosis not present

## 2022-10-11 DIAGNOSIS — R55 Syncope and collapse: Secondary | ICD-10-CM | POA: Diagnosis not present

## 2022-10-11 LAB — ECHOCARDIOGRAM COMPLETE
Area-P 1/2: 2.24 cm2
Height: 69 in
S' Lateral: 3.4 cm
Weight: 2803.19 [oz_av]

## 2022-10-11 LAB — COMPREHENSIVE METABOLIC PANEL
ALT: 17 U/L (ref 0–44)
AST: 17 U/L (ref 15–41)
Albumin: 2.9 g/dL — ABNORMAL LOW (ref 3.5–5.0)
Alkaline Phosphatase: 63 U/L (ref 38–126)
Anion gap: 8 (ref 5–15)
BUN: 31 mg/dL — ABNORMAL HIGH (ref 8–23)
CO2: 25 mmol/L (ref 22–32)
Calcium: 8.8 mg/dL — ABNORMAL LOW (ref 8.9–10.3)
Chloride: 101 mmol/L (ref 98–111)
Creatinine, Ser: 1.26 mg/dL — ABNORMAL HIGH (ref 0.61–1.24)
GFR, Estimated: 56 mL/min — ABNORMAL LOW (ref >60)
Glucose, Bld: 87 mg/dL (ref 70–99)
Potassium: 4.2 mmol/L (ref 3.5–5.1)
Sodium: 134 mmol/L — ABNORMAL LOW (ref 135–145)
Total Bilirubin: 0.6 mg/dL (ref 0.3–1.2)
Total Protein: 5.5 g/dL — ABNORMAL LOW (ref 6.5–8.1)

## 2022-10-11 LAB — CBC
HCT: 37.3 % — ABNORMAL LOW (ref 39.0–52.0)
Hemoglobin: 12.5 g/dL — ABNORMAL LOW (ref 13.0–17.0)
MCH: 31.3 pg (ref 26.0–34.0)
MCHC: 33.5 g/dL (ref 30.0–36.0)
MCV: 93.5 fL (ref 80.0–100.0)
Platelets: 172 10*3/uL (ref 150–400)
RBC: 3.99 MIL/uL — ABNORMAL LOW (ref 4.22–5.81)
RDW: 14.6 % (ref 11.5–15.5)
WBC: 7.2 10*3/uL (ref 4.0–10.5)
nRBC: 0 % (ref 0.0–0.2)

## 2022-10-11 LAB — GLUCOSE, CAPILLARY: Glucose-Capillary: 109 mg/dL — ABNORMAL HIGH (ref 70–99)

## 2022-10-11 LAB — MRSA NEXT GEN BY PCR, NASAL: MRSA by PCR Next Gen: NOT DETECTED

## 2022-10-11 MED ORDER — PERFLUTREN LIPID MICROSPHERE
1.0000 mL | INTRAVENOUS | Status: AC | PRN
  Administered 2022-10-11: 4 mL via INTRAVENOUS

## 2022-10-11 NOTE — Progress Notes (Signed)
Ordered a live 2 week Zio monitor for further evaluation of symptomatic bradycardia at the request of Dr. Natale Milch. Patient was previously followed by Dr. Eldridge Dace but has not been seen since 2016. Therefore, he will need to get re-established with our group.  A New Patient visit with Dr. Izora Ribas has been scheduled for 10/25/2022.   Corrin Parker, PA-C 10/11/2022 1:59 PM

## 2022-10-11 NOTE — Care Management Obs Status (Signed)
MEDICARE OBSERVATION STATUS NOTIFICATION   Patient Details  Name: Walter [name] MRN: [id number] Date of Birth: 08/24/1933   Medicare Observation Status Notification Given:  Waylan Boga, RN 10/11/2022, 6:47 PM

## 2022-10-11 NOTE — Care Management CC44 (Signed)
Condition Code 44 Documentation Completed  Patient Details  Name: Walter [name] MRN: [id number] Date of Birth: December 23, 1933   Condition Code 44 given:  Yes Patient signature on Condition Code 44 notice:  Yes Documentation of 2 MD's agreement:    Code 44 added to claim:  Yes    Michel Bickers, RN 10/11/2022, 6:47 PM

## 2022-10-11 NOTE — Progress Notes (Deleted)
PROGRESS NOTE    Walter [name]  ZOX:[id number] DOB: 01-27-1933 DOA: 10/10/2022 PCP: Clovis Riley, L.August Saucer, MD   Brief Narrative:  Walter [name] is a 89 y.o. male with medical history significant of hyperlipidemia, hypothyroidism, GERD, chronic kidney disease stage III, anxiety disorder, essential hypertension and peripheral vascular disease who presented to the ER with complaint of increasing exertional weakness, presyncopal feeling today after he woke up.    Assessment & Plan:   Principal Problem:   Postural dizziness with presyncope Active Problems:   Hypothyroidism   Essential hypertension   GERD   Peripheral vascular disease (HCC)   DNR (do not resuscitate)   Dyslipidemia   Sinus bradycardia   Acute symptomatic sinus bradycardia Concurrent orthostatic hypotension(possibly rate related) Patient symptoms improving today with ambulation and increased p.o. intake Echo pending -pending completion will have patient follow-up outpatient with cardiology with ZIO monitor for 2 weeks (current appointment Dr. Ronnie Doss 10/18 at 08:20) -Ambulating with PT with minimal assistance or symptoms, disposition likely home with therapy  Hypothyroidism: Patient on levothyroxine.  Thyroid panel within normal limits    Possible AKI vs likely CKD2 given chart review GFR ranging 20-50s - creatinine labile as well but never WNL.   Hyponatremia: Not clinically significant at 134 GERD: Continue PPI Essential hypertension: Continue monitoring blood pressure. Peripheral vascular disease: Stable.     DVT prophylaxis: enoxaparin (LOVENOX) injection 40 mg Start: 10/10/22 2200   Code Status:   Code Status: Do not attempt resuscitation (DNR) PRE-ARREST INTERVENTIONS DESIRED  Family Communication: None present  Status is: Inpatient  Dispo: The patient is from: Home              Anticipated d/c is to: Home              Anticipated d/c date is: 24 hours              Patient currently is medically  stable for discharge but is awaiting imaging/procedure prior to discharge  Consultants:  None  Procedures:  None  Antimicrobials:  None indicated  Subjective: No acute issues or events overnight, patient's symptoms appear to be improving, remains orthostatic today but ambulating with minimal to no assistance.  Denies nausea vomiting diarrhea constipation headache fevers chills chest pain shortness of breath.  Objective: Vitals:   10/10/22 2021 10/10/22 2100 10/10/22 2349 10/11/22 0417  BP: (!) 155/58 113/62 (!) 127/58 (!) 145/58  Pulse: (!) 48 (!) 54 (!) 45 (!) 42  Resp: 18  18 18   Temp: 97.9 F (36.6 C) 97.7 F (36.5 C) 97.7 F (36.5 C) (!) 97.5 F (36.4 C)  TempSrc: Oral Oral Oral Oral  SpO2: 100% 99% 100% 100%  Weight:  79.5 kg  79.5 kg  Height:  5\' 9"  (1.753 m)     No intake or output data in the 24 hours ending 10/11/22 0825 Filed Weights   10/10/22 1158 10/10/22 2100 10/11/22 0417  Weight: 79.4 kg 79.5 kg 79.5 kg    Examination:  General:  Pleasantly resting in bed, No acute distress. HEENT:  Normocephalic atraumatic.  Sclerae nonicteric, noninjected.  Extraocular movements intact bilaterally. Neck:  Without mass or deformity.  Trachea is midline. Lungs:  Clear to auscultate bilaterally without rhonchi, wheeze, or rales. Heart:  Regular rate and rhythm.  Without murmurs, rubs, or gallops. Abdomen:  Soft, nontender, nondistended.  Without guarding or rebound. Extremities: Without cyanosis, clubbing, edema, or obvious deformity. Skin:  Warm and dry, no erythema.  Data Reviewed: I have personally  reviewed following labs and imaging studies  CBC: Recent Labs  Lab 10/10/22 1235 10/10/22 2111 10/11/22 0545  WBC 8.8 8.9 7.2  HGB 13.5 13.7 12.5*  HCT 40.4 41.3 37.3*  MCV 93.1 93.2 93.5  PLT 197 189 172   Basic Metabolic Panel: Recent Labs  Lab 10/10/22 1235 10/10/22 2111 10/11/22 0545  NA 134*  --  134*  K 4.6  --  4.2  CL 102  --  101  CO2 24  --   25  GLUCOSE 123*  --  87  BUN 28*  --  31*  CREATININE 1.20 1.32* 1.26*  CALCIUM 8.8*  --  8.8*  MG 2.1  --   --    GFR: Estimated Creatinine Clearance: 42.1 mL/min (A) (by C-G formula based on SCr of 1.26 mg/dL (H)). Liver Function Tests: Recent Labs  Lab 10/11/22 0545  AST 17  ALT 17  ALKPHOS 63  BILITOT 0.6  PROT 5.5*  ALBUMIN 2.9*   CBG: Recent Labs  Lab 10/11/22 0443  GLUCAP 109*   Thyroid Function Tests: Recent Labs    10/10/22 2111  TSH 0.731  FREET4 1.10   Recent Results (from the past 240 hour(s))  MRSA Next Gen by PCR, Nasal     Status: None   Collection Time: 10/11/22  5:30 AM   Specimen: Nasal Mucosa; Nasal Swab  Result Value Ref Range Status   MRSA by PCR Next Gen NOT DETECTED NOT DETECTED Final    Comment: (NOTE) The GeneXpert MRSA Assay (FDA approved for NASAL specimens only), is one component of a comprehensive MRSA colonization surveillance program. It is not intended to diagnose MRSA infection nor to guide or monitor treatment for MRSA infections. Test performance is not FDA approved in patients less than 71 years old. Performed at [facility], [address] [place], [place] [address]     Radiology Studies: CT HEAD WO CONTRAST ( )  Result Date: 10/10/2022 CLINICAL DATA:  Weakness, neuro deficit, acute stroke suspected EXAM: CT HEAD WITHOUT CONTRAST TECHNIQUE: Contiguous axial images were obtained from the base of the skull through the vertex without intravenous contrast. RADIATION DOSE REDUCTION: This exam was performed according to the departmental dose-optimization program which includes automated exposure control, adjustment of the mA and/or kV according to patient size and/or use of iterative reconstruction technique. COMPARISON:  CT head 02/21/2021 FINDINGS: Brain: No intracranial hemorrhage, mass effect, or evidence of acute infarct. No hydrocephalus. No extra-axial fluid collection. Age-commensurate cerebral atrophy and chronic  small vessel ischemic disease. Vascular: No hyperdense vessel. Intracranial arterial calcification. Skull: No fracture or focal lesion. Sinuses/Orbits: No acute finding. Other: None. IMPRESSION: No acute intracranial abnormality. Electronically Signed   By: Minerva Fester M.D.   On: 10/10/2022 18:34   DG Chest Portable 1 View  Result Date: 10/10/2022 CLINICAL DATA:  Weakness EXAM: PORTABLE CHEST 1 VIEW COMPARISON:  Chest radiograph 02/21/2021 FINDINGS: The cardiomediastinal silhouette is normal. There is no focal consolidation or pulmonary edema. There is no pleural effusion or pneumothorax There is no acute osseous abnormality. IMPRESSION: No radiographic evidence of acute cardiopulmonary process. Electronically Signed   By: Lesia Hausen M.D.   On: 10/10/2022 14:57        Scheduled Meds:  acetaminophen  1,000 mg Oral QHS   clopidogrel  75 mg Oral Daily   enoxaparin (LOVENOX) injection  40 mg Subcutaneous Q24H   feeding supplement  237 mL Oral BID BM   levothyroxine  125 mcg Oral QHS  pantoprazole  40 mg Oral Daily   rosuvastatin  10 mg Oral Daily   sodium chloride flush  3 mL Intravenous Q12H   Continuous Infusions:   LOS: 0 days    Time spent:    Azucena Fallen, DO [facility]  If 7PM-7AM, please contact night-coverage [email or web address]  10/11/2022, 8:25 AM

## 2022-10-11 NOTE — Discharge Summary (Signed)
[id number]    Weight:       175.2 lb Date of Birth:  November 03, 1933    BSA:          1.953 m Patient Age:    88 years      BP:           110/68 mmHg Patient Gender: M             HR:           50 bpm. Exam Location:  Inpatient Procedure: 2D Echo, Cardiac Doppler, Color Doppler and Intracardiac             Opacification Agent Indications:    syncope  History:        Patient has no prior history of Echocardiogram examinations.                 Chronic kidney disease, Arrythmias:Bradycardia; Risk                 Factors:Hypertension and Dyslipidemia.  Sonographer:    Delcie Roch RDCS Referring Phys: [id number] MOHAMMAD L GARBA IMPRESSIONS  1. Left ventricular ejection fraction, by estimation, is 55 to 60%. The left ventricle has normal function. The left ventricle has no regional wall motion abnormalities. Left ventricular diastolic parameters are consistent with Grade I diastolic dysfunction (impaired relaxation).  2. Right ventricular systolic function is normal. The right ventricular size is normal. There is normal pulmonary artery systolic pressure.  3. Left atrial size was mildly dilated.  4. The mitral valve is normal in structure. Mild mitral valve regurgitation. No evidence of mitral stenosis.  5. The aortic valve is normal in structure. Aortic valve regurgitation is not visualized. No aortic stenosis is present.  6. The inferior vena cava is normal in size with greater than 50% respiratory variability, suggesting right atrial pressure of 3 mmHg. FINDINGS  Left Ventricle: Left ventricular ejection fraction, by estimation, is 55 to 60%. The left ventricle has normal function. The left ventricle has no regional wall motion abnormalities. The left ventricular internal cavity size was normal in size. There is  no left ventricular hypertrophy. Left ventricular diastolic parameters are consistent with Grade I diastolic dysfunction (impaired relaxation). Right Ventricle: The right ventricular size is normal. No increase in right ventricular wall thickness. Right ventricular systolic function is normal. There is normal pulmonary artery systolic pressure. The tricuspid regurgitant velocity is 2.67 m/s, and  with an assumed right atrial pressure of 3 mmHg, the estimated right ventricular systolic pressure is 31.5 mmHg.  Left Atrium: Left atrial size was mildly dilated. Right Atrium: Right atrial size was normal in size. Pericardium: There is no evidence of pericardial effusion. Presence of epicardial fat layer. Mitral Valve: The mitral valve is normal in structure. Mild mitral valve regurgitation. No evidence of mitral valve stenosis. Tricuspid Valve: The tricuspid valve is normal in structure. Tricuspid valve regurgitation is not demonstrated. No evidence of tricuspid stenosis. Aortic Valve: The aortic valve is normal in structure. Aortic valve regurgitation is not visualized. No aortic stenosis is present. Pulmonic Valve: The pulmonic valve was not well visualized. Pulmonic valve regurgitation is not visualized. No evidence of pulmonic stenosis. Aorta: The aortic root is normal in size and structure. Venous: The inferior vena cava is normal in size with greater than 50% respiratory variability, suggesting right atrial pressure of 3 mmHg. IAS/Shunts: No atrial level shunt detected by color flow Doppler.  LEFT VENTRICLE PLAX 2D LVIDd:  [id number]    Weight:       175.2 lb Date of Birth:  November 03, 1933    BSA:          1.953 m Patient Age:    88 years      BP:           110/68 mmHg Patient Gender: M             HR:           50 bpm. Exam Location:  Inpatient Procedure: 2D Echo, Cardiac Doppler, Color Doppler and Intracardiac             Opacification Agent Indications:    syncope  History:        Patient has no prior history of Echocardiogram examinations.                 Chronic kidney disease, Arrythmias:Bradycardia; Risk                 Factors:Hypertension and Dyslipidemia.  Sonographer:    Delcie Roch RDCS Referring Phys: [id number] MOHAMMAD L GARBA IMPRESSIONS  1. Left ventricular ejection fraction, by estimation, is 55 to 60%. The left ventricle has normal function. The left ventricle has no regional wall motion abnormalities. Left ventricular diastolic parameters are consistent with Grade I diastolic dysfunction (impaired relaxation).  2. Right ventricular systolic function is normal. The right ventricular size is normal. There is normal pulmonary artery systolic pressure.  3. Left atrial size was mildly dilated.  4. The mitral valve is normal in structure. Mild mitral valve regurgitation. No evidence of mitral stenosis.  5. The aortic valve is normal in structure. Aortic valve regurgitation is not visualized. No aortic stenosis is present.  6. The inferior vena cava is normal in size with greater than 50% respiratory variability, suggesting right atrial pressure of 3 mmHg. FINDINGS  Left Ventricle: Left ventricular ejection fraction, by estimation, is 55 to 60%. The left ventricle has normal function. The left ventricle has no regional wall motion abnormalities. The left ventricular internal cavity size was normal in size. There is  no left ventricular hypertrophy. Left ventricular diastolic parameters are consistent with Grade I diastolic dysfunction (impaired relaxation). Right Ventricle: The right ventricular size is normal. No increase in right ventricular wall thickness. Right ventricular systolic function is normal. There is normal pulmonary artery systolic pressure. The tricuspid regurgitant velocity is 2.67 m/s, and  with an assumed right atrial pressure of 3 mmHg, the estimated right ventricular systolic pressure is 31.5 mmHg.  Left Atrium: Left atrial size was mildly dilated. Right Atrium: Right atrial size was normal in size. Pericardium: There is no evidence of pericardial effusion. Presence of epicardial fat layer. Mitral Valve: The mitral valve is normal in structure. Mild mitral valve regurgitation. No evidence of mitral valve stenosis. Tricuspid Valve: The tricuspid valve is normal in structure. Tricuspid valve regurgitation is not demonstrated. No evidence of tricuspid stenosis. Aortic Valve: The aortic valve is normal in structure. Aortic valve regurgitation is not visualized. No aortic stenosis is present. Pulmonic Valve: The pulmonic valve was not well visualized. Pulmonic valve regurgitation is not visualized. No evidence of pulmonic stenosis. Aorta: The aortic root is normal in size and structure. Venous: The inferior vena cava is normal in size with greater than 50% respiratory variability, suggesting right atrial pressure of 3 mmHg. IAS/Shunts: No atrial level shunt detected by color flow Doppler.  LEFT VENTRICLE PLAX 2D LVIDd:  [id number]    Weight:       175.2 lb Date of Birth:  November 03, 1933    BSA:          1.953 m Patient Age:    88 years      BP:           110/68 mmHg Patient Gender: M             HR:           50 bpm. Exam Location:  Inpatient Procedure: 2D Echo, Cardiac Doppler, Color Doppler and Intracardiac             Opacification Agent Indications:    syncope  History:        Patient has no prior history of Echocardiogram examinations.                 Chronic kidney disease, Arrythmias:Bradycardia; Risk                 Factors:Hypertension and Dyslipidemia.  Sonographer:    Delcie Roch RDCS Referring Phys: [id number] MOHAMMAD L GARBA IMPRESSIONS  1. Left ventricular ejection fraction, by estimation, is 55 to 60%. The left ventricle has normal function. The left ventricle has no regional wall motion abnormalities. Left ventricular diastolic parameters are consistent with Grade I diastolic dysfunction (impaired relaxation).  2. Right ventricular systolic function is normal. The right ventricular size is normal. There is normal pulmonary artery systolic pressure.  3. Left atrial size was mildly dilated.  4. The mitral valve is normal in structure. Mild mitral valve regurgitation. No evidence of mitral stenosis.  5. The aortic valve is normal in structure. Aortic valve regurgitation is not visualized. No aortic stenosis is present.  6. The inferior vena cava is normal in size with greater than 50% respiratory variability, suggesting right atrial pressure of 3 mmHg. FINDINGS  Left Ventricle: Left ventricular ejection fraction, by estimation, is 55 to 60%. The left ventricle has normal function. The left ventricle has no regional wall motion abnormalities. The left ventricular internal cavity size was normal in size. There is  no left ventricular hypertrophy. Left ventricular diastolic parameters are consistent with Grade I diastolic dysfunction (impaired relaxation). Right Ventricle: The right ventricular size is normal. No increase in right ventricular wall thickness. Right ventricular systolic function is normal. There is normal pulmonary artery systolic pressure. The tricuspid regurgitant velocity is 2.67 m/s, and  with an assumed right atrial pressure of 3 mmHg, the estimated right ventricular systolic pressure is 31.5 mmHg.  Left Atrium: Left atrial size was mildly dilated. Right Atrium: Right atrial size was normal in size. Pericardium: There is no evidence of pericardial effusion. Presence of epicardial fat layer. Mitral Valve: The mitral valve is normal in structure. Mild mitral valve regurgitation. No evidence of mitral valve stenosis. Tricuspid Valve: The tricuspid valve is normal in structure. Tricuspid valve regurgitation is not demonstrated. No evidence of tricuspid stenosis. Aortic Valve: The aortic valve is normal in structure. Aortic valve regurgitation is not visualized. No aortic stenosis is present. Pulmonic Valve: The pulmonic valve was not well visualized. Pulmonic valve regurgitation is not visualized. No evidence of pulmonic stenosis. Aorta: The aortic root is normal in size and structure. Venous: The inferior vena cava is normal in size with greater than 50% respiratory variability, suggesting right atrial pressure of 3 mmHg. IAS/Shunts: No atrial level shunt detected by color flow Doppler.  LEFT VENTRICLE PLAX 2D LVIDd:  Physician Discharge Summary  KAMONTE MCMICHEN GNF:[id number] DOB: [birth year] DOA: 10/10/2022  PCP: Clovis Riley, L.August Saucer, MD  Admit date: 10/10/2022 Discharge date: 10/11/2022  Admitted From: Home Disposition:  Home  Recommendations for Outpatient Follow-up:  Follow up with PCP in 1-2 weeks See cardiology 10/18 @ 08:20 as scheduled with Dr Ronnie Doss  Discharge Condition:Stable  CODE STATUS:DNR  Diet recommendation: Low salt low fat diet   Brief/Interim Summary: Walter [name] is a 89 y.o. male with medical history significant of hyperlipidemia, hypothyroidism, GERD, chronic kidney disease stage III, anxiety disorder, essential hypertension and peripheral vascular disease who presented to the ER with complaint of increasing exertional weakness, presyncopal feeling today after he woke up.   Patient evaluated for symptomatic bradycardia - resolved with supportive care - echo WNL - will follow outpatient for zio patch and cardiology follow up on the 18th.  Discharge Diagnoses:  Principal Problem:   Postural dizziness with presyncope Active Problems:   Hypothyroidism   Essential hypertension   GERD   Peripheral vascular disease (HCC)   DNR (do not resuscitate)   Dyslipidemia   Sinus bradycardia   Symptomatic sinus bradycardia   Acute symptomatic sinus bradycardia Concurrent orthostatic hypotension(possibly rate related) Patient symptoms improving today with ambulation and increased p.o. intake Echo without overt stenosis/regurgitation or wall motion abnormality - follow-up outpatient with cardiology with ZIO monitor for 2 weeks (current appointment Dr. Ronnie Doss 10/18 at 08:20) -Ambulating with PT with minimal assistance or symptoms, disposition likely home with therapy   Hypothyroidism: Patient on levothyroxine.  Thyroid panel within normal limits    Possible AKI vs likely CKD2 given chart review GFR ranging 20-50s - creatinine labile as well but never WNL.   Hyponatremia: Not  clinically significant at 134 GERD: Continue PPI Essential hypertension: Continue monitoring blood pressure. Peripheral vascular disease: Stable. Discharge Instructions  Discharge Instructions     Discharge patient   Complete by: As directed    Discharge disposition: 01-Home or Self Care   Discharge patient date: 10/11/2022      Allergies as of 10/11/2022       Reactions   Penicillins Other (See Comments)   Boils        Medication List     TAKE these medications    acetaminophen 500 MG tablet Commonly known as: TYLENOL Take 1,000 mg by mouth at bedtime.   alum & mag hydroxide-simeth 200-200-20 MG/5ML suspension Commonly known as: MAALOX/MYLANTA Take 15 mLs by mouth daily.   clopidogrel 75 MG tablet Commonly known as: PLAVIX Take 1 tablet (75 mg total) by mouth daily.   feeding supplement Liqd Take 1 Container by mouth 2 (two) times daily between meals.   levothyroxine 125 MCG tablet Commonly known as: SYNTHROID Take 125 mcg by mouth at bedtime.   OMEGA 3-6-9 COMPLEX PO Take 2 capsules by mouth daily.   omeprazole 40 MG capsule Commonly known as: PRILOSEC Take 1 capsule (40 mg total) by mouth daily. Office visit for further refills   rosuvastatin 10 MG tablet Commonly known as: CRESTOR Take 10 mg by mouth daily.        Allergies  Allergen Reactions   Penicillins Other (See Comments)    Boils    Consultations: Cardiology   Procedures/Studies: ECHOCARDIOGRAM COMPLETE  Result Date: 10/11/2022    ECHOCARDIOGRAM REPORT   Patient Name:   Zakery TAEVEON KEESLING Date of Exam: 10/11/2022 Medical Rec #:  [id number]     Height:       69.0 in Accession #:  Physician Discharge Summary  KAMONTE MCMICHEN GNF:[id number] DOB: [birth year] DOA: 10/10/2022  PCP: Clovis Riley, L.August Saucer, MD  Admit date: 10/10/2022 Discharge date: 10/11/2022  Admitted From: Home Disposition:  Home  Recommendations for Outpatient Follow-up:  Follow up with PCP in 1-2 weeks See cardiology 10/18 @ 08:20 as scheduled with Dr Ronnie Doss  Discharge Condition:Stable  CODE STATUS:DNR  Diet recommendation: Low salt low fat diet   Brief/Interim Summary: Walter [name] is a 89 y.o. male with medical history significant of hyperlipidemia, hypothyroidism, GERD, chronic kidney disease stage III, anxiety disorder, essential hypertension and peripheral vascular disease who presented to the ER with complaint of increasing exertional weakness, presyncopal feeling today after he woke up.   Patient evaluated for symptomatic bradycardia - resolved with supportive care - echo WNL - will follow outpatient for zio patch and cardiology follow up on the 18th.  Discharge Diagnoses:  Principal Problem:   Postural dizziness with presyncope Active Problems:   Hypothyroidism   Essential hypertension   GERD   Peripheral vascular disease (HCC)   DNR (do not resuscitate)   Dyslipidemia   Sinus bradycardia   Symptomatic sinus bradycardia   Acute symptomatic sinus bradycardia Concurrent orthostatic hypotension(possibly rate related) Patient symptoms improving today with ambulation and increased p.o. intake Echo without overt stenosis/regurgitation or wall motion abnormality - follow-up outpatient with cardiology with ZIO monitor for 2 weeks (current appointment Dr. Ronnie Doss 10/18 at 08:20) -Ambulating with PT with minimal assistance or symptoms, disposition likely home with therapy   Hypothyroidism: Patient on levothyroxine.  Thyroid panel within normal limits    Possible AKI vs likely CKD2 given chart review GFR ranging 20-50s - creatinine labile as well but never WNL.   Hyponatremia: Not  clinically significant at 134 GERD: Continue PPI Essential hypertension: Continue monitoring blood pressure. Peripheral vascular disease: Stable. Discharge Instructions  Discharge Instructions     Discharge patient   Complete by: As directed    Discharge disposition: 01-Home or Self Care   Discharge patient date: 10/11/2022      Allergies as of 10/11/2022       Reactions   Penicillins Other (See Comments)   Boils        Medication List     TAKE these medications    acetaminophen 500 MG tablet Commonly known as: TYLENOL Take 1,000 mg by mouth at bedtime.   alum & mag hydroxide-simeth 200-200-20 MG/5ML suspension Commonly known as: MAALOX/MYLANTA Take 15 mLs by mouth daily.   clopidogrel 75 MG tablet Commonly known as: PLAVIX Take 1 tablet (75 mg total) by mouth daily.   feeding supplement Liqd Take 1 Container by mouth 2 (two) times daily between meals.   levothyroxine 125 MCG tablet Commonly known as: SYNTHROID Take 125 mcg by mouth at bedtime.   OMEGA 3-6-9 COMPLEX PO Take 2 capsules by mouth daily.   omeprazole 40 MG capsule Commonly known as: PRILOSEC Take 1 capsule (40 mg total) by mouth daily. Office visit for further refills   rosuvastatin 10 MG tablet Commonly known as: CRESTOR Take 10 mg by mouth daily.        Allergies  Allergen Reactions   Penicillins Other (See Comments)    Boils    Consultations: Cardiology   Procedures/Studies: ECHOCARDIOGRAM COMPLETE  Result Date: 10/11/2022    ECHOCARDIOGRAM REPORT   Patient Name:   Zakery TAEVEON KEESLING Date of Exam: 10/11/2022 Medical Rec #:  [id number]     Height:       69.0 in Accession #:

## 2022-10-11 NOTE — Progress Notes (Signed)
OT Cancellation Note  Patient Details Name: Walter [name] MRN: [id number] DOB: 1933/01/14   Cancelled Treatment:    Reason Eval/Treat Not Completed: Patient at procedure or test/ unavailable (echo. will return later date)  [facility] 10/11/2022, 4:40 PM Luisa Dago, OT/L   Acute OT Clinical Specialist Acute Rehabilitation Services Pager [phone] Office [phone]

## 2022-10-11 NOTE — Progress Notes (Signed)
  Echocardiogram 2D Echocardiogram has been performed.  Delcie Roch 10/11/2022, 4:58 PM

## 2022-10-11 NOTE — Evaluation (Signed)
Physical Therapy Evaluation Patient Details Name: Walter [name] MRN: [id number] DOB: 1933/10/25 Today's Date: 10/11/2022  History of Present Illness  89 yo male presents to [facility] on 10/3 with weakness, chest pain, SOB, tachycardia. PMH includes anemia, anxiety, OA, CKDIII, depression, HTN, PVD s/p fempop bypass 2014.  Clinical Impression   Pt presents with min weakness vs baseline but still WFL, impaired balance, and decreased activity tolerance vs baseline. Pt to benefit from acute PT to address deficits. Pt ambulated good hallway distance with use of RW and supervision for safety. Pt's orthostatic vitals + for orthostatic hypotension with pt reporting feeling "weak" when moving sit>stand, but BP improves for standing 0 min to 3 min and pt's weakness and lightheadedness resolved. Formal documentation of orthostatics documented previously by this PT. Pt declines need for [facility] services, states he will use RW once d/c home for balance, and will have assist from family and friends as needed. PT reviewed importance of monitoring for s/s of orthostatic hypotension, and waiting between each postural change to ensure no dizziness/weakness is noted. Pt expresses understanding. PT to progress mobility as tolerated, and will continue to follow acutely.          If plan is discharge home, recommend the following: A little help with walking and/or transfers;A little help with bathing/dressing/bathroom   Can travel by private vehicle        Equipment Recommendations None recommended by PT  Recommendations for Other Services       Functional Status Assessment Patient has had a recent decline in their functional status and demonstrates the ability to make significant improvements in function in a reasonable and predictable amount of time.     Precautions / Restrictions Precautions Precautions: Fall Precaution Comments: history of postural dizziness Restrictions Weight Bearing Restrictions: No       Mobility  Bed Mobility Overal bed mobility: Needs Assistance Bed Mobility: Supine to Sit     Supine to sit: Supervision, HOB elevated     General bed mobility comments: increased time, no physical assist    Transfers Overall transfer level: Needs assistance Equipment used: Rolling walker (2 wheels) Transfers: Sit to/from Stand Sit to Stand: Contact guard assist           General transfer comment: for safety, slow to rise but no physical assist to perform. benefits from use of RW    Ambulation/Gait Ambulation/Gait assistance: Supervision Gait Distance (Feet): 330 Feet Assistive device: Rolling walker (2 wheels) Gait Pattern/deviations: Step-through pattern, Decreased stride length Gait velocity: decr vs baseline     General Gait Details: for safety, cues for proximity of RW and hallway navigation  Stairs            Wheelchair Mobility     Tilt Bed    Modified Rankin (Stroke Patients Only)       Balance Overall balance assessment: Mild deficits observed, not formally tested                                           Pertinent Vitals/Pain Pain Assessment Pain Assessment: No/denies pain    Home Living Family/patient expects to be discharged to:: Private residence Living Arrangements: Alone Available Help at Discharge: Family;Friend(s);Available PRN/intermittently Type of Home: House Home Access: Level entry       Home Layout: One level Home Equipment: Agricultural consultant (2 wheels);Cane - single point  Prior Function Prior Level of Function : Independent/Modified Independent;Driving             Mobility Comments: uses cane in community       Extremity/Trunk Assessment   Upper Extremity Assessment Upper Extremity Assessment: Defer to OT evaluation    Lower Extremity Assessment Lower Extremity Assessment: Overall WFL for tasks assessed    Cervical / Trunk Assessment Cervical / Trunk Assessment: Normal   Communication   Communication Communication: No apparent difficulties Cueing Techniques: Verbal cues  Cognition Arousal: Alert Behavior During Therapy: WFL for tasks assessed/performed                                            General Comments General comments (skin integrity, edema, etc.): orthostatic hypotension +, but BP improves for standing 0 min to 3 min    Exercises     Assessment/Plan    PT Assessment Patient needs continued PT services  PT Problem List Decreased activity tolerance;Decreased mobility;Decreased balance;Decreased knowledge of use of DME;Decreased knowledge of precautions       PT Treatment Interventions DME instruction;Gait training;Functional mobility training;Therapeutic activities;Balance training;Patient/family education    PT Goals (Current goals can be found in the Care Plan section)  Acute Rehab PT Goals Patient Stated Goal: home PT Goal Formulation: With patient Time For Goal Achievement: 10/25/22 Potential to Achieve Goals: Good    Frequency Min 1X/week     Co-evaluation               AM-PAC PT "6 Clicks" Mobility  Outcome Measure Help needed turning from your back to your side while in a flat bed without using bedrails?: None Help needed moving from lying on your back to sitting on the side of a flat bed without using bedrails?: None Help needed moving to and from a bed to a chair (including a wheelchair)?: A Little Help needed standing up from a chair using your arms (e.g., wheelchair or bedside chair)?: A Little Help needed to walk in hospital room?: A Little Help needed climbing 3-5 steps with a railing? : A Little 6 Click Score: 20    End of Session   Activity Tolerance: Patient tolerated treatment well Patient left: in chair;with call bell/phone within reach;with chair alarm set Nurse Communication: Mobility status PT Visit Diagnosis: Other abnormalities of gait and mobility (R26.89)    Time:  4166-0630 PT Time Calculation (min) (ACUTE ONLY): 25 min   Charges:   PT Evaluation $PT Eval Low Complexity: 1 Low PT Treatments $Therapeutic Activity: 8-22 mins PT General Charges $$ ACUTE PT VISIT: 1 Visit         Marye Round, PT DPT Acute Rehabilitation Services Secure Chat Preferred  Office [phone]   Teanna Elem E Stroup 10/11/2022, 11:13 AM

## 2022-10-11 NOTE — Progress Notes (Signed)
   10/11/22 1000  Orthostatic Lying   BP- Lying 146/58  Pulse- Lying 50  Orthostatic Sitting  BP- Sitting 122/64  Pulse- Sitting 54  Orthostatic Standing at 0 minutes  BP- Standing at 0 minutes 99/50  Pulse- Standing at 0 minutes 62  Orthostatic Standing at 3 minutes  BP- Standing at 3 minutes 117/55  Pulse- Standing at 3 minutes 71    Dawne Casali S, PT DPT Acute Rehabilitation Services Secure Chat Preferred  Office [phone]

## 2022-10-14 ENCOUNTER — Ambulatory Visit: Payer: Medicare Other | Attending: Student

## 2022-10-14 DIAGNOSIS — Z9989 Dependence on other enabling machines and devices: Secondary | ICD-10-CM | POA: Diagnosis not present

## 2022-10-14 DIAGNOSIS — Z23 Encounter for immunization: Secondary | ICD-10-CM | POA: Diagnosis not present

## 2022-10-14 DIAGNOSIS — I10 Essential (primary) hypertension: Secondary | ICD-10-CM | POA: Diagnosis not present

## 2022-10-14 DIAGNOSIS — R001 Bradycardia, unspecified: Secondary | ICD-10-CM

## 2022-10-14 DIAGNOSIS — N1831 Chronic kidney disease, stage 3a: Secondary | ICD-10-CM | POA: Diagnosis not present

## 2022-10-14 DIAGNOSIS — R42 Dizziness and giddiness: Secondary | ICD-10-CM | POA: Diagnosis not present

## 2022-10-14 DIAGNOSIS — K219 Gastro-esophageal reflux disease without esophagitis: Secondary | ICD-10-CM | POA: Diagnosis not present

## 2022-10-14 DIAGNOSIS — I739 Peripheral vascular disease, unspecified: Secondary | ICD-10-CM | POA: Diagnosis not present

## 2022-10-14 NOTE — Progress Notes (Unsigned)
Enrolled for [employer] to mail a ZIO AT Live Telemetry monitor to patients address on file.   Dr. Gasper Sells to read.

## 2022-10-17 ENCOUNTER — Telehealth: Payer: Self-pay | Admitting: Internal Medicine

## 2022-10-17 DIAGNOSIS — R001 Bradycardia, unspecified: Secondary | ICD-10-CM | POA: Diagnosis not present

## 2022-10-17 NOTE — Telephone Encounter (Signed)
Patient would like to know how long he needs to wear event monitor. Please advise.

## 2022-10-17 NOTE — Telephone Encounter (Signed)
Returned pt's call. Let pt know his monitor was to be worn for 14 days.Pt stated understanding.

## 2022-10-18 DIAGNOSIS — R001 Bradycardia, unspecified: Secondary | ICD-10-CM | POA: Diagnosis not present

## 2022-10-24 ENCOUNTER — Telehealth: Payer: Self-pay | Admitting: Physical Medicine and Rehabilitation

## 2022-10-24 NOTE — Telephone Encounter (Signed)
Pt called requesting a call from [place]. Please call pt about an appt. Pt phone number is [phone].

## 2022-10-25 ENCOUNTER — Ambulatory Visit: Payer: Medicare Other | Attending: Internal Medicine | Admitting: Internal Medicine

## 2022-10-25 ENCOUNTER — Telehealth: Payer: Self-pay

## 2022-10-25 ENCOUNTER — Encounter: Payer: Self-pay | Admitting: Internal Medicine

## 2022-10-25 VITALS — BP 126/56 | HR 54 | Ht 69.0 in | Wt 182.0 lb

## 2022-10-25 DIAGNOSIS — R001 Bradycardia, unspecified: Secondary | ICD-10-CM | POA: Diagnosis not present

## 2022-10-25 DIAGNOSIS — I779 Disorder of arteries and arterioles, unspecified: Secondary | ICD-10-CM | POA: Diagnosis not present

## 2022-10-25 DIAGNOSIS — Z95828 Presence of other vascular implants and grafts: Secondary | ICD-10-CM | POA: Diagnosis not present

## 2022-10-25 MED ORDER — FLUDROCORTISONE ACETATE 0.1 MG PO TABS: 0.0500 mg | ORAL_TABLET | Freq: Every day | ORAL | 3 refills | Status: DC

## 2022-10-25 MED ORDER — FLUDROCORTISONE ACETATE 0.1 MG PO TABS: ORAL_TABLET | ORAL | 3 refills | Status: DC

## 2022-10-25 NOTE — Patient Instructions (Signed)
Medication Instructions:  Your physician has recommended you make the following change in your medication:  START: fludrocortisone (Florinef) 0.05 mg by mouth once daily (half a pill)  *If you need a refill on your cardiac medications before your next appointment, please call your pharmacy*   Lab Work: IN 7-10 DAYS: BMP If you have labs (blood work) drawn today and your tests are completely normal, you will receive your results only by: MyChart Message (if you have MyChart) OR A paper copy in the mail If you have any lab test that is abnormal or we need to change your treatment, we will call you to review the results.   Testing/Procedures: NONE   Follow-Up: At [facility], you and your health needs are our priority.  As part of our continuing mission to provide you with exceptional heart care, we have created designated Provider Care Teams.  These Care Teams include your primary Cardiologist (physician) and Advanced Practice Providers (APPs -  Physician Assistants and Nurse Practitioners) who all work together to provide you with the care you need, when you need it.   Your next appointment:   3 month(s)  Provider:   Christell Constant, MD  or  Marjie Skiff

## 2022-10-25 NOTE — Progress Notes (Signed)
Cardiology Office Note:  .    Date:  10/25/2022  ID:  Walter [name], DOB [birth year], MRN [id number] PCP: Asencion Gowda.August Saucer, MD  [facility] Providers Cardiologist:  None     CC: Multi-factorial near syncope Consulted for the evaluation of symptomatic bradycardia at the behest of Dr. Darius Bump  History of Present Illness: .    Walter [name] is a 89 y.o. male with  a history of HLD, hypothyroidism on synthroid, with CKD who presents for weakness and dizziness.   2014: established for Dr. Eldridge Dace; evaluated by Dr. Eldridge Dace for leg claudication, established with [facility] for PAD. 2024: Recent ED eval for near syncope.  Found to have orthostatic hypotension.  Found to have bradycardia.  Echo reported as mild MR.  Normal biventricular function.  Has monitor.  Discussed the use of AI scribe software for clinical note transcription with the patient, who gave verbal consent to proceed.  Mr. Ninneman, an 89 year old individual with a history of hyperlipidemia, peripheral arterial disease, hypothyroidism, and chronic kidney disease, presents with ongoing symptoms of weakness and dizziness. The patient has not seen his previous physician, Dr. Eldridge Dace, in some time and has been managing his peripheral arterial disease through vein and vascular surgery colleagues.  Recently, the patient experienced near syncope and was evaluated in the emergency department where he was found to have orthostatic hypotension and a heart rate of 51.   The patient reports persistent dizziness that only occurs when standing.. The patient is not currently on any medications that would augment his blood pressure or heart rate. His TSH was within normal limits and his electrolytes did not suggest bradycardia related to electrolyte abnormality. However, he had a relatively low sodium level of 134.  The patient also reports a recent episode of chest pain, which was unusual for him. He describes this as different from the chest  discomfort he usually experiences during physical activities that specifically involve lifting using the chesat. The patient has a history of smoking but quit approximately 47 years ago.  However, despite these efforts, his symptoms persist. He also reports trying to hydrate more with Gatorade and water, although he admits to not liking water and consuming a fair amount of coffee and tea.  Relevant histories: .  Social- former [facility] patient ROS: As per HPI.   Studies Reviewed: .   Cardiac Studies & Procedures       ECHOCARDIOGRAM  ECHOCARDIOGRAM COMPLETE 10/11/2022  Narrative ECHOCARDIOGRAM REPORT    Patient Name:   Walter [name] Date of Exam: 10/11/2022 Medical Rec #:  [id number]     Height:       69.0 in Accession #:    [id number]    Weight:       175.2 lb Date of Birth:  07/14/1933    BSA:          1.953 m Patient Age:    88 years      BP:           110/68 mmHg Patient Gender: M             HR:           50 bpm. Exam Location:  Inpatient  Procedure: 2D Echo, Cardiac Doppler, Color Doppler and Intracardiac Opacification Agent  Indications:    syncope  History:        Patient has no prior history of Echocardiogram examinations. Chronic kidney disease, Arrythmias:Bradycardia; Risk Factors:Hypertension and Dyslipidemia.  Sonographer:    Delcie Roch  RDCS Referring Phys: [id number] MOHAMMAD L GARBA  IMPRESSIONS   1. Left ventricular ejection fraction, by estimation, is 55 to 60%. The left ventricle has normal function. The left ventricle has no regional wall motion abnormalities. Left ventricular diastolic parameters are consistent with Grade I diastolic dysfunction (impaired relaxation). 2. Right ventricular systolic function is normal. The right ventricular size is normal. There is normal pulmonary artery systolic pressure. 3. Left atrial size was mildly dilated. 4. The mitral valve is normal in structure. Mild mitral valve regurgitation. No evidence of mitral stenosis. 5.  The aortic valve is normal in structure. Aortic valve regurgitation is not visualized. No aortic stenosis is present. 6. The inferior vena cava is normal in size with greater than 50% respiratory variability, suggesting right atrial pressure of 3 mmHg.  FINDINGS Left Ventricle: Left ventricular ejection fraction, by estimation, is 55 to 60%. The left ventricle has normal function. The left ventricle has no regional wall motion abnormalities. The left ventricular internal cavity size was normal in size. There is no left ventricular hypertrophy. Left ventricular diastolic parameters are consistent with Grade I diastolic dysfunction (impaired relaxation).  Right Ventricle: The right ventricular size is normal. No increase in right ventricular wall thickness. Right ventricular systolic function is normal. There is normal pulmonary artery systolic pressure. The tricuspid regurgitant velocity is 2.67 m/s, and with an assumed right atrial pressure of 3 mmHg, the estimated right ventricular systolic pressure is 31.5 mmHg.  Left Atrium: Left atrial size was mildly dilated.  Right Atrium: Right atrial size was normal in size.  Pericardium: There is no evidence of pericardial effusion. Presence of epicardial fat layer.  Mitral Valve: The mitral valve is normal in structure. Mild mitral valve regurgitation. No evidence of mitral valve stenosis.  Tricuspid Valve: The tricuspid valve is normal in structure. Tricuspid valve regurgitation is not demonstrated. No evidence of tricuspid stenosis.  Aortic Valve: The aortic valve is normal in structure. Aortic valve regurgitation is not visualized. No aortic stenosis is present.  Pulmonic Valve: The pulmonic valve was not well visualized. Pulmonic valve regurgitation is not visualized. No evidence of pulmonic stenosis.  Aorta: The aortic root is normal in size and structure.  Venous: The inferior vena cava is normal in size with greater than 50% respiratory  variability, suggesting right atrial pressure of 3 mmHg.  IAS/Shunts: No atrial level shunt detected by color flow Doppler.   LEFT VENTRICLE PLAX 2D LVIDd:         4.80 cm   Diastology LVIDs:         3.40 cm   LV e' medial:    7.94 cm/s LV PW:         0.80 cm   LV E/e' medial:  8.0 LV IVS:        1.00 cm   LV e' lateral:   11.10 cm/s LVOT diam:     2.10 cm   LV E/e' lateral: 5.7 LV SV:         66 LV SV Index:   34 LVOT Area:     3.46 cm   RIGHT VENTRICLE             IVC RV Basal diam:  2.50 cm     IVC diam: 1.90 cm RV S prime:     15.10 cm/s TAPSE (M-mode): 2.5 cm  LEFT ATRIUM             Index        RIGHT ATRIUM  Index LA diam:        4.20 cm 2.15 cm/m   RA Area:     17.50 cm LA Vol (A2C):   79.2 ml 40.55 ml/m  RA Volume:   43.10 ml  22.07 ml/m LA Vol (A4C):   53.1 ml 27.19 ml/m LA Biplane Vol: 66.2 ml 33.90 ml/m AORTIC VALVE LVOT Vmax:   76.40 cm/s LVOT Vmean:  48.000 cm/s LVOT VTI:    0.191 m  AORTA Ao Root diam: 3.10 cm Ao Asc diam:  3.00 cm  MITRAL VALVE               TRICUSPID VALVE MV Area (PHT): 2.24 cm    TR Peak grad:   28.5 mmHg MV Decel Time: 338 msec    TR Vmax:        267.00 cm/s MV E velocity: 63.60 cm/s MV A velocity: 79.80 cm/s  SHUNTS MV E/A ratio:  0.80        Systemic VTI:  0.19 m Systemic Diam: 2.10 cm  Kardie Tobb DO Electronically signed by Thomasene Ripple DO Signature Date/Time: 10/11/2022/5:10:51 PM    Final             Physical Exam:    VS:  BP (!) 126/56   Pulse (!) 54   Ht 5\' 9"  (1.753 m)   Wt 182 lb (82.6 kg)   SpO2 96%   BMI 26.88 kg/m    Wt Readings from Last 3 Encounters:  10/25/22 182 lb (82.6 kg)  10/11/22 175 lb 3.2 oz (79.5 kg)  07/29/22 175 lb (79.4 kg)    OH VITALS Lying: BP- 148/64, P- 54,  Sitting BP- 138/54, P- 54,  Standing BP- 98/57, P- 72,  Standing prolonged BP- 104/62, P- 70  Gen: no distress  Neck: No JVD Ears: Bialteral Frank Sign Cardiac: No Rubs or Gallops, no murmur, regular  bradycardia, +2 radial pulses Respiratory: Expiratory rhonchi bilaterally, normal effort, normal  respiratory rate GI: Soft, nontender, non-distended  MS: No edema above his compression stokcings;  moves all extremities Integument: Skin feels warm Neuro:  At time of evaluation, alert and oriented to person/place/time/situation  Psych: Normal affect, patient feels well   ASSESSMENT AND PLAN: .    Orthostatic Hypotension - Symptomatic with dizziness and near syncope. Not on any medications that would augment blood pressure or heart rate. Sodium slightly low at 134. Despite attempts to increase salt intake and use of compression stockings, symptoms persist. -Start Fludrocortisone 0.05mg  daily. -Check BMP in 7-10 days; if leg swelling, HA, hypernatremia or AKI switch for midodrine -Discussed use of abdominal binder. -Continue to encourage increased salt and water intake.  Bradycardia Heart rate of 50, unclear if symptomatic. Currently wearing a heart monitor. No electrolyte abnormalities or medications that would cause bradycardia. -Continue monitoring with heart monitor. -Plan for exercise treadmill test to evaluate for chronotropic incompetence once bike testing is available at [facility]  Chest Pain CKD stage IIIa - History of peripheral arterial disease. Patient reports chest pain during physical activity that is likely related to the type of chest exercise he has and one episode of chest pain at rest that could be anginal -Plan for ischemic testing, either cardiac CT or stress test, depending on kidney function results from upcoming BMP and heart monitor results; consent for stress testing if needed  PAD Hyperlipidemia LDL under 70 on current medication regimen. -Continue current medication regimen  Mild Mitral regurgitation - may reassess in 3 years of if new  [place]  Follow-up in 3 months with either me or Marjie Skiff PA-C to assess symptom improvement and discuss results of  testing.    Riley Lam, MD FASE [facility] Cardiologist [facility]  [address] [place], [address] [place], [place] [address] [phone]  8:14 AM

## 2022-10-25 NOTE — Telephone Encounter (Signed)
Awaiting blood thinner discontinuation form. PCP to be notified

## 2022-10-25 NOTE — Addendum Note (Signed)
Addended by: Macie Burows on: 10/25/2022 09:07 AM   Modules accepted: Orders

## 2022-10-25 NOTE — Telephone Encounter (Signed)
Pre-operative Risk Assessment    Patient Name: DELDRICK [name]  DOB: April 25, 1933 MRN: [id number]   Last office visit 10/25/22 Dr. Izora Ribas  Upcoming visit 01/23/22 with Marjie Skiff PA-C  Request for Surgical Clearance    Procedure:  Epidural steroid injection   Date of Surgery:  Clearance TBD                                 Surgeon:  Dr. Alvester Morin  Surgeon's Group or Practice Name:  [facility] Phone number:  [phone] Fax number:  [phone]   Type of Clearance Requested:   - Pharmacy:  Hold Clopidogrel (Plavix) 7 days    Type of Anesthesia:  Not Indicated   Additional requests/questions:    Scarlette Shorts   10/25/2022, 4:47 PM

## 2022-10-25 NOTE — Telephone Encounter (Signed)
Spoke with patient and informed him that we have not received the blood thinner discontinuation form from [facility]. He stated he gets the Plavix from his PCP at [address] [facility]. Form faxed for approval

## 2022-10-25 NOTE — Addendum Note (Signed)
Addended by: Macie Burows on: 10/25/2022 09:11 AM   Modules accepted: Orders

## 2022-10-28 NOTE — Telephone Encounter (Signed)
Preoperative team, please add preoperative cardiac evaluation to upcoming clinic appointment notes.  He has upcoming appointment with Marjie Skiff, PA-C on 01/23/2022.  His Plavix may be held for 7 days prior to his injection.  Plavix will need to be resumed as soon as hemostasis is achieved.  Thank you,  Walter [name]. Walter Zacharia NP-C     10/28/2022, 11:13 AM [facility] [address] Office [phone] Fax [phone]

## 2022-10-29 NOTE — Telephone Encounter (Signed)
I will update all parties involved to see notes per Edd Fabian, FNP.

## 2022-10-30 NOTE — Telephone Encounter (Signed)
Hey pre-op team,   Just wanted to clarify. Patient's visit with me is not until January. Does he need his spinal injection before then? Is this something Dr. Izora Ribas can comment on given he just saw him last week?  Thanks! Anadelia Kintz

## 2022-10-31 NOTE — Telephone Encounter (Signed)
Dr. Izora Ribas, you recently saw this patient on 10/25/22.  We have received a request for patient to undergo epidural steroid injection with a request to hold Plavix for 7 days.  Based on your recent assessment, do you feel that he may proceed with procedure without further cardiac testing.  Please route your response to p cv div preop.  Thank you, Marcelino Duster

## 2022-11-01 DIAGNOSIS — R001 Bradycardia, unspecified: Secondary | ICD-10-CM | POA: Diagnosis not present

## 2022-11-01 NOTE — Telephone Encounter (Signed)
Spoke with patient who states that he was told to come get labs done in 7-10 days and he will becoming today to get them done.

## 2022-11-02 LAB — BASIC METABOLIC PANEL
BUN/Creatinine Ratio: 25 — ABNORMAL HIGH (ref 10–24)
BUN: 30 mg/dL — ABNORMAL HIGH (ref 8–27)
CO2: 24 mmol/L (ref 20–29)
Calcium: 8.8 mg/dL (ref 8.6–10.2)
Chloride: 102 mmol/L (ref 96–106)
Creatinine, Ser: 1.21 mg/dL (ref 0.76–1.27)
Glucose: 75 mg/dL (ref 70–99)
Potassium: 4.7 mmol/L (ref 3.5–5.2)
Sodium: 138 mmol/L (ref 134–144)
eGFR: 58 mL/min/{1.73_m2} — ABNORMAL LOW (ref >59)

## 2022-11-05 NOTE — Telephone Encounter (Signed)
Call placed to Walter [name] to see how he has been doing since his previous follow-up visit.  Patient was unavailable and detailed message left with instruction to call us back at earliest convenience.  Alden Server, NP

## 2022-11-05 NOTE — Telephone Encounter (Signed)
Patient Name: Walter [name]  DOB: September 12, 1933 MRN: [id number]  Primary Cardiologist: Christell Constant, MD  Chart reviewed as part of pre-operative protocol coverage. Given past medical history and time since last visit, based on ACC/AHA guidelines, MAXON DAHLE is at acceptable risk for the planned procedure without further cardiovascular testing.   The patient was advised that if he develops new symptoms prior to surgery to contact our office to arrange for a follow-up visit, and he verbalized understanding.  Patient can hold Plavix 7 days prior to procedure and should restart postprocedure when surgically safe and instructed by performing MD.  I will route this recommendation to the requesting party via Epic fax function and remove from pre-op pool.  Please call with questions.  Napoleon Form, Leodis Rains, NP 11/05/2022, 1:23 PM

## 2022-11-26 DIAGNOSIS — I739 Peripheral vascular disease, unspecified: Secondary | ICD-10-CM | POA: Diagnosis not present

## 2022-11-26 DIAGNOSIS — L603 Nail dystrophy: Secondary | ICD-10-CM | POA: Diagnosis not present

## 2022-11-28 ENCOUNTER — Telehealth: Payer: Self-pay | Admitting: Internal Medicine

## 2022-11-28 ENCOUNTER — Telehealth: Payer: Self-pay | Admitting: Physical Medicine and Rehabilitation

## 2022-11-28 MED ORDER — MIDODRINE HCL 5 MG PO TABS: 5.0000 mg | ORAL_TABLET | Freq: Three times a day (TID) | ORAL | 2 refills | Status: DC

## 2022-11-28 NOTE — Telephone Encounter (Signed)
Pt c/o medication issue:  1. Name of Medication: fludrocortisone (FLORINEF) 0.1 MG tablet   2. How are you currently taking this medication (dosage and times per day)? Take 0.5 tablets (0.05 mg total) by mouth daily. Take half a pill once daily   3. Are you having a reaction (difficulty breathing--STAT)? No   4. What is your medication issue? Medication makes patients legs severely swollen. Wants to know if there is another BP medication he could try

## 2022-11-28 NOTE — Telephone Encounter (Signed)
Spoke with pt and discussed that per the A/P from a prior note, we will switch him to Midodrine 5 mg TID since he is experiencing LE swelling. Pt agreed to this and was made aware of the prescription being sent to [facility]. Pt told to monitor symptoms and continue to watch LE swelling.

## 2022-11-28 NOTE — Telephone Encounter (Signed)
Patient called and said that the cortizone shot in the hip didn't work and he can barely sleep. CB#[phone]

## 2022-12-04 ENCOUNTER — Ambulatory Visit: Payer: Medicare Other | Admitting: Physical Medicine and Rehabilitation

## 2022-12-04 ENCOUNTER — Encounter: Payer: Self-pay | Admitting: Physical Medicine and Rehabilitation

## 2022-12-04 VITALS — BP 143/62 | HR 64

## 2022-12-04 DIAGNOSIS — M48061 Spinal stenosis, lumbar region without neurogenic claudication: Secondary | ICD-10-CM | POA: Diagnosis not present

## 2022-12-04 DIAGNOSIS — M5441 Lumbago with sciatica, right side: Secondary | ICD-10-CM

## 2022-12-04 DIAGNOSIS — M5416 Radiculopathy, lumbar region: Secondary | ICD-10-CM | POA: Diagnosis not present

## 2022-12-04 DIAGNOSIS — G8929 Other chronic pain: Secondary | ICD-10-CM | POA: Diagnosis not present

## 2022-12-04 DIAGNOSIS — R1031 Right lower quadrant pain: Secondary | ICD-10-CM | POA: Diagnosis not present

## 2022-12-04 NOTE — Progress Notes (Signed)
Walter [name] - 89 y.o. male MRN [id number]  Date of birth: [birth year]  Office Visit Note: Visit Date: 12/04/2022 PCP: Clovis Riley, L.August Saucer, MD (Inactive) Referred by: Tyrell Antonio, MD  Subjective: Chief Complaint  Patient presents with   Lower Back - Pain   Left Hip - Pain   Right Hip - Pain   HPI: Walter [name] is a 89 y.o. male who comes in today for evaluation of chronic, worsening and severe bilateral lower back pain radiating to lateral hips and down right anterolateral leg down to knee. He also reports chronic issues with right groin pain, especially with walking. He reports lower back and right leg pain is most severe at this time. His pain worsens with standing and walking. He describes as sore and aching, currently rates as 8 out of 10. Some relief of pain with home exercise regimen, rest and use of medications. Recent lumbar MRI imaging exhibits stepwise retrolisthesis of L3 on L4, L4 on L5, and L5 on S1, multi level moderate facet arthropathy, Severe bilateral neural foraminal narrowing at L4-L5, right-greater-than-left. Severe foraminal stenosis on the right at L5-S1. No high grade spinal canal stenosis noted. He is managed from orthopedic standpoint by Harriette Bouillon, PA whom perform right hip injection on 07/22/2022. He reports significant relief of pain with right hip injection for several months. He does have history of bypass graft to left lower extremity by Dr. Coral Else in 2014. He reports weakness to left lower extremity post surgery. He was recently admitted to the hospital in October for issues with atrial fibrillation, he is currently taking Plavix and is managed by Dr. Izora Ribas with [facility]. Patient currently ambulates with cane, he denies recent trauma or falls.      Review of Systems  Musculoskeletal:  Positive for back pain and joint pain.  Neurological:  Positive for focal weakness. Negative for tingling and sensory change.  All other systems reviewed  and are negative.  Otherwise per HPI.  Assessment & Plan: Visit Diagnoses:    ICD-10-CM   1. Lumbar radiculopathy  M54.16 Ambulatory referral to Physical Medicine Rehab    2. Chronic bilateral low back pain with right-sided sciatica  M54.41 Ambulatory referral to Physical Medicine Rehab   G89.29     3. Foraminal stenosis of lumbar region  M48.061 Ambulatory referral to Physical Medicine Rehab    4. Groin pain, chronic, right  R10.31 Ambulatory referral to Physical Medicine Rehab   [phone]        Plan: Findings:  1. Chronic, worsening and severe bilateral lower back pain radiating to lateral hips and down right anterolateral leg down to knee. Patient continues to have severe pain despite good conservative therapies such as home exercise regimen, rest and use of medications. Patients clinical presentation and exam are consistent with L4 pattern. There is severe multi level foraminal stenosis on recent lumbar MRI imaging. Next step is to perform diagnostic and hopefully therapeutic bilateral L4 transforaminal epidural steroid injection under fluoroscopic guidance. He can stay on Plavix for this injection. If good relief of pain with injection we can repeat this procedure infrequently as needed. No red flag symptoms noted upon exam today.   2. Chronic, worsening and severe right groin/hip pain. Previous right hip injection in August significantly helped to alleviate his pain. Right hip radiographs from July show moderate to severe joint space narrowing. Would consider sending him back to [facility] for management, could also look at right intra-articular hip injection under fluoroscopy. His right groin/hip  pain is not biggest issue at this time. I will sent [facility] to ensure he is updated.     Meds & Orders: No orders of the defined types were placed in this encounter.   Orders Placed This Encounter  Procedures   Ambulatory referral to Physical Medicine Rehab    Follow-up: Return for  Bilateral L4 transforaminal epidural steroid injection.   Procedures: No procedures performed      Clinical History: CLINICAL DATA:  MR lumbar spine eval for source of right radicular leg pain   EXAM: MRI LUMBAR SPINE WITHOUT CONTRAST   TECHNIQUE: Multiplanar, multisequence MR imaging of the lumbar spine was performed. No intravenous contrast was administered.   COMPARISON:  CT L Spine 02/21/21   FINDINGS: Segmentation:  Standard.   Alignment: Stepwise retrolisthesis of L3 on L4, L4 on L5, and L5 on S1.   Vertebrae:  No fracture, evidence of discitis, or bone lesion.   Conus medullaris and cauda equina: Conus extends to the L2 level. Conus and cauda equina appear normal.   Paraspinal and other soft tissues: There are small bilateral T2 hyperintense renal lesions which are too small to accurately characterize.   Disc levels:   T12-L1: Mild bilateral facet degenerative change. Eccentric right disc bulge. Mild spinal canal narrowing. No neural foraminal narrowing.   L1-L2: Mild bilateral facet degenerative change. No significant disc bulge. No spinal canal narrowing. No neural foraminal narrowing.   L2-L3: Mild bilateral facet degenerative change. No significant disc bulge. No spinal canal narrowing. Mild left neural foraminal narrowing.   L3-L4: Mild bilateral facet degenerative change. Circumferential disc bulge. Mild spinal canal narrowing. Moderate bilateral neural foraminal narrowing.   L4-L5: Moderate bilateral facet degenerative change. Circumferential disc bulge. Retrolisthesis of L4 on L5. Severe bilateral neural foraminal narrowing, right-greater-than-left.   L5-S1: Moderate left and mild right facet degenerative change. Minimal disc bulge. No spinal canal narrowing. Moderate left and severe right neural foraminal narrowing.   IMPRESSION: Lower lumbar spine predominant degenerative changes with severe neural foraminal narrowing at L4-L5 (bilateral)  and L5-S1 (right). No evidence of high-grade spinal canal stenosis.     Electronically Signed   By: Lorenza Cambridge M.D.   On: 09/02/2022 15:32   He reports that he quit smoking about 37 years ago. His smoking use included cigarettes. He started smoking about 57 years ago. He has a 20 pack-year smoking history. He has never used smokeless tobacco. No results for input(s): "HGBA1C", "LABURIC" in the last 8760 hours.  Objective:  VS:  HT:    WT:   BMI:     BP:(!) 143/62  HR:64bpm  TEMP: ( )  RESP:  Physical Exam Vitals and nursing note reviewed.  HENT:     Head: Normocephalic and atraumatic.     Right Ear: External ear normal.     Left Ear: External ear normal.     Nose: Nose normal.     Mouth/Throat:     Mouth: Mucous membranes are moist.  Eyes:     Extraocular Movements: Extraocular movements intact.  Cardiovascular:     Rate and Rhythm: Normal rate.     Pulses: Normal pulses.  Pulmonary:     Effort: Pulmonary effort is normal.  Abdominal:     General: Abdomen is flat. There is no distension.  Musculoskeletal:        General: Tenderness present.     Cervical back: Normal range of motion.     Comments: Patient rises from seated position  to standing without difficulty. Good lumbar range of motion. No pain noted with facet loading. 5/5 strength noted with bilateral hip flexion, knee flexion/extension. EHL bilaterally 4/5, dorsiflexion 4/5 on the left, 5/5 on right.  No clonus noted bilaterally. No clonus noted bilaterally. No pain upon palpation of greater trochanters. Pain with internal/external rotation of right hip. Sensation intact bilaterally. Negative slump test bilaterally. Ambulates with cane, gait slow and unsteady.   Skin:    General: Skin is warm and dry.     Capillary Refill: Capillary refill takes less than 2 seconds.  Neurological:     General: No focal deficit present.     Mental Status: He is alert and oriented to person, place, and time.  Psychiatric:         Mood and Affect: Mood normal.        Behavior: Behavior normal.     Ortho Exam  Imaging: No results found.  Past Medical/Family/Surgical/Social History: Medications & Allergies reviewed per EMR, new medications updated. Patient Active Problem List   Diagnosis Date Noted   Symptomatic sinus bradycardia 10/11/2022   Postural dizziness with presyncope 10/10/2022   Sinus bradycardia 10/10/2022   Syncope and collapse 02/21/2021   Nausea vomiting and diarrhea 02/21/2021   Abnormal chest x-ray 02/21/2021   DNR (do not resuscitate) 02/21/2021   Dyslipidemia 02/21/2021   SIRS (systemic inflammatory response syndrome) (HCC) 05/30/2014   Blood in stool 05/28/2014   Dehydration 05/28/2014   Clostridium difficile colitis 05/28/2014   Acute kidney injury (HCC) 05/28/2014   Abdominal pain    PVD (peripheral vascular disease) (HCC) 10/25/2013   Seroma, infected, postoperative 06/10/2013   Pain in limb-Left Medial thigh 06/03/2013   Post op infection-Left Medial Thigh 06/03/2013   Atherosclerosis of native arteries of the extremities with ulceration (HCC) 01/04/2013   Peripheral vascular disease (HCC) 11/09/2012   Atherosclerosis of native artery of extremity with intermittent claudication (HCC) 10/19/2012   HEMATOMA 09/06/2009   WEIGHT LOSS-ABNORMAL 05/04/2009   ABDOMINAL PAIN -GENERALIZED 05/04/2009   CHEST PAIN 10/26/2007   COLONIC POLYPS 06/09/2007   Hypothyroidism 06/09/2007   DEPRESSION 06/09/2007   Essential hypertension 06/09/2007   RESIDUAL HEMORRHOIDAL SKIN TAGS 06/09/2007   GERD 06/09/2007   BARRETTS ESOPHAGUS 06/09/2007   HIATAL HERNIA 06/09/2007   DIVERTICULOSIS, COLON 06/09/2007   ARTHRITIS 06/09/2007   Past Medical History:  Diagnosis Date   Anemia    pt denies this   Anxiety    Arthritis    "legs and feet" (06/10/2013)   Barrett's esophagus    "never dilated" (06/10/2013)   Cataract    Chronic kidney disease    stage 3   Colon polyps    tubular adenoma-last  colon 12/18/2005   Depression    Diverticulosis    GERD (gastroesophageal reflux disease)    takes omeprazole   Hematoma    on mid back   Hiatal hernia    High cholesterol    Hypertension    Hypothyroidism    takes synthroid   Peripheral vascular disease (HCC)    Family History  Problem Relation Age of Onset   Heart disease Mother    Lung cancer Brother        and sister   Melanoma Father    Alzheimer's disease Sister        x 2   Heart disease Sister    Hypertension Sister    Colon cancer Neg Hx    Colon polyps Neg Hx    Rectal cancer  Neg Hx    Pancreatic cancer Neg Hx    Stomach cancer Neg Hx    Esophageal cancer Neg Hx    Past Surgical History:  Procedure Laterality Date   CARDIAC CATHETERIZATION     CATARACT EXTRACTION W/ INTRAOCULAR LENS  IMPLANT, BILATERAL Bilateral    COLONOSCOPY     EYE SURGERY     FEMORAL-POPLITEAL BYPASS GRAFT Left 11/27/2012   Procedure: BYPASS GRAFT LEFT ABOVE KNEE TO BELOW KNEE POPLITEAL ARTERY USING LEFT NONREVERSED GREATER SAPPHENOUS VEIN;  Surgeon: Nada Libman, MD;  Location: [facility] OR;  Service: Vascular;  Laterality: Left;   I & D EXTREMITY Left 06/10/2013   leg; "from the OR in 11/2012"   I & D EXTREMITY Left 06/10/2013   Procedure: IRRIGATION AND DEBRIDEMENT OF LEFT UPPER LEG;  Surgeon: Nada Libman, MD;  Location: [facility] OR;  Service: Vascular;  Laterality: Left;   LOWER EXTREMITY ANGIOGRAM N/A 10/01/2012   Procedure: LOWER EXTREMITY ANGIOGRAM;  Surgeon: Corky Crafts, MD;  Location: [facility] CATH LAB;  Service: Cardiovascular;  Laterality: N/A;   POLYPECTOMY     stomach   PYLOROPLASTY     TONSILLECTOMY     TRANSURETHRAL RESECTION OF PROSTATE     UPPER GASTROINTESTINAL ENDOSCOPY     Social History   Occupational History   Occupation: Retired  Tobacco Use   Smoking status: Former    Current packs/day: 0.00    Average packs/day: 1 pack/day for 20.0 years (20.0 ttl pk-yrs)    Types: Cigarettes    Start date: 06/28/1965    Quit  date: 06/28/1985    Years since quitting: 37.4   Smokeless tobacco: Never  Vaping Use   Vaping status: Never Used  Substance and Sexual Activity   Alcohol use: No    Comment: 06/10/2013 "drank recreationally when I did drink; stopped ~ 2010"   Drug use: No   Sexual activity: Never

## 2022-12-04 NOTE — Progress Notes (Signed)
Functional Pain Scale - descriptive words and definitions  Unmanageable (7)  Pain interferes with normal ADL's/nothing seems to help/sleep is very difficult/active distractions are very difficult to concentrate on. Severe range order  Average Pain 7  He is having LBP, and pain in both hips. Its worse in the right hip. He's okay sitting up but lying down is hard.

## 2022-12-19 ENCOUNTER — Telehealth: Payer: Self-pay | Admitting: Physical Medicine and Rehabilitation

## 2022-12-19 NOTE — Telephone Encounter (Signed)
Patient called advised he has an appointment on December 19th and wanted to know if he had to stop taken his blood thinner?  The number to contact patient is [phone]

## 2022-12-26 ENCOUNTER — Other Ambulatory Visit: Payer: Self-pay

## 2022-12-26 ENCOUNTER — Ambulatory Visit: Payer: Medicare Other | Admitting: Physical Medicine and Rehabilitation

## 2022-12-26 DIAGNOSIS — M5416 Radiculopathy, lumbar region: Secondary | ICD-10-CM | POA: Diagnosis not present

## 2022-12-26 MED ORDER — METHYLPREDNISOLONE ACETATE 40 MG/ML IJ SUSP
40.0000 mg | Freq: Once | INTRAMUSCULAR | Status: DC
Start: 2022-12-26 — End: 2023-08-04

## 2022-12-26 NOTE — Procedures (Signed)
Lumbosacral Transforaminal Epidural Steroid Injection - Sub-Pedicular Approach with Fluoroscopic Guidance  Patient: Walter [name]      Date of Birth: [birth year] MRN: [id number] PCP: Clovis Riley, L.August Saucer, MD (Inactive)      Visit Date: 12/26/2022   Universal Protocol:    Date/Time: 12/26/2022  Consent Given By: the patient  Position: PRONE  Additional Comments: Vital signs were monitored before and after the procedure. Patient was prepped and draped in the usual sterile fashion. The correct patient, procedure, and site was verified.   Injection Procedure Details:   Procedure diagnoses: Lumbar radiculopathy [M54.16]    Meds Administered:  Meds ordered this encounter  Medications   methylPREDNISolone acetate (DEPO-MEDROL) injection 40 mg    Laterality: Bilateral  Location/Site: L4  Needle:5.0 in., 22 ga.  Short bevel or Quincke spinal needle  Needle Placement: Transforaminal  Findings:    -Comments: Excellent flow of contrast along the nerve, nerve root and into the epidural space.  Procedure Details: After squaring off the end-plates to get a true AP view, the C-arm was positioned so that an oblique view of the foramen as noted above was visualized. The target area is just inferior to the "nose of the scotty dog" or sub pedicular. The soft tissues overlying this structure were infiltrated with 2-3 ml. of 1% Lidocaine without Epinephrine.  The spinal needle was inserted toward the target using a "trajectory" view along the fluoroscope beam.  Under AP and lateral visualization, the needle was advanced so it did not puncture dura and was located close the 6 O'Clock position of the pedical in AP tracterory. Biplanar projections were used to confirm position. Aspiration was confirmed to be negative for CSF and/or blood. A 1-2 ml. volume of Isovue-250 was injected and flow of contrast was noted at each level. Radiographs were obtained for documentation purposes.   After attaining  the desired flow of contrast documented above, a 0.5 to 1.0 ml test dose of 0.25% Marcaine was injected into each respective transforaminal space.  The patient was observed for 90 seconds post injection.  After no sensory deficits were reported, and normal lower extremity motor function was noted,   the above injectate was administered so that equal amounts of the injectate were placed at each foramen (level) into the transforaminal epidural space.   Additional Comments:  No complications occurred Dressing: 2 x 2 sterile gauze and Band-Aid    Post-procedure details: Patient was observed during the procedure. Post-procedure instructions were reviewed.  Patient left the clinic in stable condition.

## 2022-12-26 NOTE — Progress Notes (Signed)
BROLIN ALLSBROOK - 89 y.o. male MRN [id number]  Date of birth: Feb 13, 1933  Office Visit Note: Visit Date: 12/26/2022 PCP: Clovis Riley, L.August Saucer, MD (Inactive) Referred by: Juanda Chance, NP  Subjective: Chief Complaint  Patient presents with   Lower Back - Pain   HPI:  JAMMAL KHAM is a 89 y.o. male who comes in today at the request of Ellin Goodie, FNP for planned Bilateral L4-5 Lumbar Transforaminal epidural steroid injection with fluoroscopic guidance.  The patient has failed conservative care including home exercise, medications, time and activity modification.  This injection will be diagnostic and hopefully therapeutic.  Please see requesting physician notes for further details and justification.   ROS Otherwise per HPI.  Assessment & Plan: Visit Diagnoses:    ICD-10-CM   1. Lumbar radiculopathy  M54.16 XR C-ARM NO REPORT    Epidural Steroid injection    methylPREDNISolone acetate (DEPO-MEDROL) injection 40 mg      Plan: No additional findings.   Meds & Orders:  Meds ordered this encounter  Medications   methylPREDNISolone acetate (DEPO-MEDROL) injection 40 mg    Orders Placed This Encounter  Procedures   XR C-ARM NO REPORT   Epidural Steroid injection    Follow-up: Return if symptoms worsen or fail to improve.   Procedures: No procedures performed  Lumbosacral Transforaminal Epidural Steroid Injection - Sub-Pedicular Approach with Fluoroscopic Guidance  Patient: JAKEITH DEMONT      Date of Birth: [birth year] MRN: [id number] PCP: Clovis Riley, L.August Saucer, MD (Inactive)      Visit Date: 12/26/2022   Universal Protocol:    Date/Time: 12/26/2022  Consent Given By: the patient  Position: PRONE  Additional Comments: Vital signs were monitored before and after the procedure. Patient was prepped and draped in the usual sterile fashion. The correct patient, procedure, and site was verified.   Injection Procedure Details:   Procedure diagnoses: Lumbar radiculopathy  [M54.16]    Meds Administered:  Meds ordered this encounter  Medications   methylPREDNISolone acetate (DEPO-MEDROL) injection 40 mg    Laterality: Bilateral  Location/Site: L4  Needle:5.0 in., 22 ga.  Short bevel or Quincke spinal needle  Needle Placement: Transforaminal  Findings:    -Comments: Excellent flow of contrast along the nerve, nerve root and into the epidural space.  Procedure Details: After squaring off the end-plates to get a true AP view, the C-arm was positioned so that an oblique view of the foramen as noted above was visualized. The target area is just inferior to the "nose of the scotty dog" or sub pedicular. The soft tissues overlying this structure were infiltrated with 2-3 ml. of 1% Lidocaine without Epinephrine.  The spinal needle was inserted toward the target using a "trajectory" view along the fluoroscope beam.  Under AP and lateral visualization, the needle was advanced so it did not puncture dura and was located close the 6 O'Clock position of the pedical in AP tracterory. Biplanar projections were used to confirm position. Aspiration was confirmed to be negative for CSF and/or blood. A 1-2 ml. volume of Isovue-250 was injected and flow of contrast was noted at each level. Radiographs were obtained for documentation purposes.   After attaining the desired flow of contrast documented above, a 0.5 to 1.0 ml test dose of 0.25% Marcaine was injected into each respective transforaminal space.  The patient was observed for 90 seconds post injection.  After no sensory deficits were reported, and normal lower extremity motor function was noted,   the above injectate  was administered so that equal amounts of the injectate were placed at each foramen (level) into the transforaminal epidural space.   Additional Comments:  No complications occurred Dressing: 2 x 2 sterile gauze and Band-Aid    Post-procedure details: Patient was observed during the  procedure. Post-procedure instructions were reviewed.  Patient left the clinic in stable condition.    Clinical History: CLINICAL DATA:  MR lumbar spine eval for source of right radicular leg pain   EXAM: MRI LUMBAR SPINE WITHOUT CONTRAST   TECHNIQUE: Multiplanar, multisequence MR imaging of the lumbar spine was performed. No intravenous contrast was administered.   COMPARISON:  CT L Spine 02/21/21   FINDINGS: Segmentation:  Standard.   Alignment: Stepwise retrolisthesis of L3 on L4, L4 on L5, and L5 on S1.   Vertebrae:  No fracture, evidence of discitis, or bone lesion.   Conus medullaris and cauda equina: Conus extends to the L2 level. Conus and cauda equina appear normal.   Paraspinal and other soft tissues: There are small bilateral T2 hyperintense renal lesions which are too small to accurately characterize.   Disc levels:   T12-L1: Mild bilateral facet degenerative change. Eccentric right disc bulge. Mild spinal canal narrowing. No neural foraminal narrowing.   L1-L2: Mild bilateral facet degenerative change. No significant disc bulge. No spinal canal narrowing. No neural foraminal narrowing.   L2-L3: Mild bilateral facet degenerative change. No significant disc bulge. No spinal canal narrowing. Mild left neural foraminal narrowing.   L3-L4: Mild bilateral facet degenerative change. Circumferential disc bulge. Mild spinal canal narrowing. Moderate bilateral neural foraminal narrowing.   L4-L5: Moderate bilateral facet degenerative change. Circumferential disc bulge. Retrolisthesis of L4 on L5. Severe bilateral neural foraminal narrowing, right-greater-than-left.   L5-S1: Moderate left and mild right facet degenerative change. Minimal disc bulge. No spinal canal narrowing. Moderate left and severe right neural foraminal narrowing.   IMPRESSION: Lower lumbar spine predominant degenerative changes with severe neural foraminal narrowing at L4-L5  (bilateral) and L5-S1 (right). No evidence of high-grade spinal canal stenosis.     Electronically Signed   By: Lorenza Cambridge M.D.   On: 09/02/2022 15:32     Objective:  VS:  HT:    WT:   BMI:     BP:   HR: bpm  TEMP: ( )  RESP:  Physical Exam Vitals and nursing note reviewed.  Constitutional:      General: He is not in acute distress.    Appearance: Normal appearance. He is not ill-appearing.  HENT:     Head: Normocephalic and atraumatic.     Right Ear: External ear normal.     Left Ear: External ear normal.     Nose: No congestion.  Eyes:     Extraocular Movements: Extraocular movements intact.  Cardiovascular:     Rate and Rhythm: Normal rate.     Pulses: Normal pulses.  Pulmonary:     Effort: Pulmonary effort is normal. No respiratory distress.  Abdominal:     General: There is no distension.     Palpations: Abdomen is soft.  Musculoskeletal:        General: No tenderness or signs of injury.     Cervical back: Neck supple.     Right lower leg: No edema.     Left lower leg: No edema.     Comments: Patient has good distal strength without clonus.  Skin:    Findings: No erythema or rash.  Neurological:     General: No focal  deficit present.     Mental Status: He is alert and oriented to person, place, and time.     Sensory: No sensory deficit.     Motor: No weakness or abnormal muscle tone.     Coordination: Coordination normal.  Psychiatric:        Mood and Affect: Mood normal.        Behavior: Behavior normal.      Imaging: No results found.

## 2022-12-26 NOTE — Patient Instructions (Signed)

## 2023-01-22 ENCOUNTER — Ambulatory Visit: Payer: Medicare Other | Admitting: Orthopedic Surgery

## 2023-01-22 ENCOUNTER — Encounter: Payer: Self-pay | Admitting: Orthopedic Surgery

## 2023-01-22 DIAGNOSIS — M5416 Radiculopathy, lumbar region: Secondary | ICD-10-CM

## 2023-01-22 NOTE — Progress Notes (Signed)
Office Visit Note   Patient: Walter [name]           Date of Birth: 09-17-1933           MRN: [id number] Visit Date: 01/22/2023 Requested by: No referring provider defined for this encounter. PCP: Clovis Riley, L.August Saucer, MD (Inactive)  Subjective: Chief Complaint  Patient presents with   Right Hip - Pain    HPI: Walter [name] is a 89 y.o. male who presents to the office reporting right hip and low back pain.  He reports significant symptoms in both the right groin and low back radiating into the right leg.  He actually has to sit when he shaves.  Hard for him to lean over for shaving.  Sitting helps his back.  Laying down does not help his hip.  He actually has hip pain which is not necessarily activity related and this is pain in the groin which improves when he sits up and stands up.  He is on Plavix for peripheral vascular disease.  Having some heart issues which she will get resolved at the end of this month.  Really hard for him to do anything.  Reports both groin and buttock pain on the right-hand side.  Symptoms are worse at night.  He has had a intra-articular hip injection which gave him some relief of symptoms more so than his back..  Lumbar spine MRI demonstrates severe neuroforaminal narrowing at L4-5.  That study was done in August of last year.              ROS: All systems reviewed are negative as they relate to the chief complaint within the history of present illness.  Patient denies fevers or chills.  Assessment & Plan: Visit Diagnoses:  1. Lumbar radiculopathy     Plan: Impression is atypical right hip arthritis type pain.  In general his pain is worse at night when he is laying down and it does improve when he sits up.  Hard for him to stand when he is shaving and he has to sit to do some of these activities which helps his back pain.  In general he is 89 years old with a lot of medical comorbidities.  It is not crystal-clear that replacing his hip is going to get rid of the  back generated pain that he is having.  I think would be good to have him seen by Dr. Christell Constant in February to get his take on the hip versus back origin of his pain.  In general he does not really describe groin pain limiting his ambulation it is more the back and leg.  In addition the fact that he has worse groin pain at night and that improves when he sits up also argues a little bit more against doing hip replacement and what would be a moderate risk at least patient.  He will let us know in February how the heart intervention goes.  We will hold off on any intervention for now.  Follow-Up Instructions: No follow-ups on file.   Orders:  No orders of the defined types were placed in this encounter.  No orders of the defined types were placed in this encounter.     Procedures: No procedures performed   Clinical Data: No additional findings.  Objective: Vital Signs: There were no vitals taken for this visit.  Physical Exam:  Constitutional: Patient appears well-developed HEENT:  Head: Normocephalic Eyes:EOM are normal Neck: Normal range of motion Cardiovascular: Normal rate  Pulmonary/chest: Effort normal Neurologic: Patient is alert Skin: Skin is warm Psychiatric: Patient has normal mood and affect  Ortho Exam: Ortho exam demonstrates normal gait without Trendelenburg type gait.  Has good ankle dorsiflexion plantarflexion quad hamstring strength.  Hip flexion strength also symmetric between sides.  Not too much in terms of groin pain with internal/external rotation of either leg.  No muscle atrophy in either leg.  Does have some pain with forward and lateral bending.  Specialty Comments:  CLINICAL DATA:  MR lumbar spine eval for source of right radicular leg pain   EXAM: MRI LUMBAR SPINE WITHOUT CONTRAST   TECHNIQUE: Multiplanar, multisequence MR imaging of the lumbar spine was performed. No intravenous contrast was administered.   COMPARISON:  CT L Spine 02/21/21    FINDINGS: Segmentation:  Standard.   Alignment: Stepwise retrolisthesis of L3 on L4, L4 on L5, and L5 on S1.   Vertebrae:  No fracture, evidence of discitis, or bone lesion.   Conus medullaris and cauda equina: Conus extends to the L2 level. Conus and cauda equina appear normal.   Paraspinal and other soft tissues: There are small bilateral T2 hyperintense renal lesions which are too small to accurately characterize.   Disc levels:   T12-L1: Mild bilateral facet degenerative change. Eccentric right disc bulge. Mild spinal canal narrowing. No neural foraminal narrowing.   L1-L2: Mild bilateral facet degenerative change. No significant disc bulge. No spinal canal narrowing. No neural foraminal narrowing.   L2-L3: Mild bilateral facet degenerative change. No significant disc bulge. No spinal canal narrowing. Mild left neural foraminal narrowing.   L3-L4: Mild bilateral facet degenerative change. Circumferential disc bulge. Mild spinal canal narrowing. Moderate bilateral neural foraminal narrowing.   L4-L5: Moderate bilateral facet degenerative change. Circumferential disc bulge. Retrolisthesis of L4 on L5. Severe bilateral neural foraminal narrowing, right-greater-than-left.   L5-S1: Moderate left and mild right facet degenerative change. Minimal disc bulge. No spinal canal narrowing. Moderate left and severe right neural foraminal narrowing.   IMPRESSION: Lower lumbar spine predominant degenerative changes with severe neural foraminal narrowing at L4-L5 (bilateral) and L5-S1 (right). No evidence of high-grade spinal canal stenosis.     Electronically Signed   By: Lorenza Cambridge M.D.   On: 09/02/2022 15:32  Imaging: No results found.   PMFS History: Patient Active Problem List   Diagnosis Date Noted   Symptomatic sinus bradycardia 10/11/2022   Postural dizziness with presyncope 10/10/2022   Sinus bradycardia 10/10/2022   Syncope and collapse 02/21/2021    Nausea vomiting and diarrhea 02/21/2021   Abnormal chest x-ray 02/21/2021   DNR (do not resuscitate) 02/21/2021   Dyslipidemia 02/21/2021   SIRS (systemic inflammatory response syndrome) (HCC) 05/30/2014   Blood in stool 05/28/2014   Dehydration 05/28/2014   Clostridium difficile colitis 05/28/2014   Acute kidney injury (HCC) 05/28/2014   Abdominal pain    PVD (peripheral vascular disease) (HCC) 10/25/2013   Seroma, infected, postoperative 06/10/2013   Pain in limb-Left Medial thigh 06/03/2013   Post op infection-Left Medial Thigh 06/03/2013   Atherosclerosis of native arteries of the extremities with ulceration (HCC) 01/04/2013   Peripheral vascular disease (HCC) 11/09/2012   Atherosclerosis of native artery of extremity with intermittent claudication (HCC) 10/19/2012   HEMATOMA 09/06/2009   WEIGHT LOSS-ABNORMAL 05/04/2009   ABDOMINAL PAIN -GENERALIZED 05/04/2009   CHEST PAIN 10/26/2007   COLONIC POLYPS 06/09/2007   Hypothyroidism 06/09/2007   DEPRESSION 06/09/2007   Essential hypertension 06/09/2007   RESIDUAL HEMORRHOIDAL SKIN TAGS 06/09/2007   GERD  06/09/2007   BARRETTS ESOPHAGUS 06/09/2007   HIATAL HERNIA 06/09/2007   DIVERTICULOSIS, COLON 06/09/2007   ARTHRITIS 06/09/2007   Past Medical History:  Diagnosis Date   Anemia    pt denies this   Anxiety    Arthritis    "legs and feet" (06/10/2013)   Barrett's esophagus    "never dilated" (06/10/2013)   Cataract    Chronic kidney disease    stage 3   Colon polyps    tubular adenoma-last colon 12/18/2005   Depression    Diverticulosis    GERD (gastroesophageal reflux disease)    takes omeprazole   Hematoma    on mid back   Hiatal hernia    High cholesterol    Hypertension    Hypothyroidism    takes synthroid   Peripheral vascular disease (HCC)     Family History  Problem Relation Age of Onset   Heart disease Mother    Lung cancer Brother        and sister   Melanoma Father    Alzheimer's disease Sister         x 2   Heart disease Sister    Hypertension Sister    Colon cancer Neg Hx    Colon polyps Neg Hx    Rectal cancer Neg Hx    Pancreatic cancer Neg Hx    Stomach cancer Neg Hx    Esophageal cancer Neg Hx     Past Surgical History:  Procedure Laterality Date   CARDIAC CATHETERIZATION     CATARACT EXTRACTION W/ INTRAOCULAR LENS  IMPLANT, BILATERAL Bilateral    COLONOSCOPY     EYE SURGERY     FEMORAL-POPLITEAL BYPASS GRAFT Left 11/27/2012   Procedure: BYPASS GRAFT LEFT ABOVE KNEE TO BELOW KNEE POPLITEAL ARTERY USING LEFT NONREVERSED GREATER SAPPHENOUS VEIN;  Surgeon: Nada Libman, MD;  Location: [facility] OR;  Service: Vascular;  Laterality: Left;   I & D EXTREMITY Left 06/10/2013   leg; "from the OR in 11/2012"   I & D EXTREMITY Left 06/10/2013   Procedure: IRRIGATION AND DEBRIDEMENT OF LEFT UPPER LEG;  Surgeon: Nada Libman, MD;  Location: [facility] OR;  Service: Vascular;  Laterality: Left;   LOWER EXTREMITY ANGIOGRAM N/A 10/01/2012   Procedure: LOWER EXTREMITY ANGIOGRAM;  Surgeon: Corky Crafts, MD;  Location: [facility] CATH LAB;  Service: Cardiovascular;  Laterality: N/A;   POLYPECTOMY     stomach   PYLOROPLASTY     TONSILLECTOMY     TRANSURETHRAL RESECTION OF PROSTATE     UPPER GASTROINTESTINAL ENDOSCOPY     Social History   Occupational History   Occupation: Retired  Tobacco Use   Smoking status: Former    Current packs/day: 0.00    Average packs/day: 1 pack/day for 20.0 years (20.0 ttl pk-yrs)    Types: Cigarettes    Start date: 06/28/1965    Quit date: 06/28/1985    Years since quitting: 37.5   Smokeless tobacco: Never  Vaping Use   Vaping status: Never Used  Substance and Sexual Activity   Alcohol use: No    Comment: 06/10/2013 "drank recreationally when I did drink; stopped ~ 2010"   Drug use: No   Sexual activity: Never

## 2023-01-24 ENCOUNTER — Ambulatory Visit: Payer: Medicare Other | Admitting: Student

## 2023-01-24 ENCOUNTER — Ambulatory Visit (HOSPITAL_COMMUNITY)
Admission: RE | Admit: 2023-01-24 | Discharge: 2023-01-24 | Disposition: A | Discharge status: Home / Self Care | Payer: Medicare Other | Source: Ambulatory Visit | Attending: Surgery | Admitting: Surgery

## 2023-01-24 ENCOUNTER — Ambulatory Visit (INDEPENDENT_AMBULATORY_CARE_PROVIDER_SITE_OTHER)
Admission: RE | Admit: 2023-01-24 | Discharge: 2023-01-24 | Disposition: A | Discharge status: Home / Self Care | Payer: Medicare Other | Source: Ambulatory Visit | Attending: Surgery | Admitting: Surgery

## 2023-01-24 DIAGNOSIS — I779 Disorder of arteries and arterioles, unspecified: Secondary | ICD-10-CM | POA: Diagnosis present

## 2023-01-24 LAB — VAS US ABI WITH/WO TBI
Left ABI: 1.02
Right ABI: 0.86

## 2023-01-26 NOTE — Progress Notes (Signed)
Cardiology Office Note:    Date:  02/06/2023   ID:  Walter [name], DOB [birth year], MRN [id number]  PCP:  Walter Gowda.Walter Saucer, MD (Inactive)  Cardiologist:  Walter Constant, MD     Referring MD: Walter Gowda.Walter Saucer, MD   Chief Complaint: follow-up of orthostatic hypotension and bradycardia  History of Present Illness:    Walter [name] is a 89 y.o. male with a history of bradycardia, PAD s/p left femoral-tibial bypass in 2014, hypertension complicated by orthostatic hypotension, hyperlipidemia, hypothyroidism, GERD with Barrett's esophagus, CKD stage IIIa, and anxiety/ depression who is followed by Dr. Izora [name] and presents today for follow-up of orthostatic hypotension and bradycardia.  Patient was previously seen by Dr. Eldridge [name] in 03/2014 for PAD. Peripheral angiogram in 2014 showed severe right distal SFA disease and an occluded left popliteal artery as well as bilateral tibial disease. He underwent a left femoral-tibial bypass in 11/2012. Is PAD is now followed by Vascular Surgery.   He was last seen by Dr. Izora [name] in 10/2022 after not being seen since 2016 for further evaluation of bradycardia after a recent admission for near syncope earlier that month. He was noted to be orthostatic at that time. However, he was also noted to be bradycardic so there was concern for symptomatic bradycardia. Echo showed LVEF of 55-60% with normal wall motion and grade 1 diastolic dysfunction, normal RV size and function, and mild MR. Outpatient Zio monitor was ordered and showed predominant sinus rhythm with average heart rate of 52 bpm with frequent SVT (most runs were 5-6 beats long but long run was 24 seconds) and isolated PACs/ PVCs but no high grade AV block or pauses. At the visit with Dr. Izora [name], he reported persistent dizziness only when standing. He also reported a recent episode of chest pain that could be angina (that was different the the usual pain he has when using the  exercise machine). He was started on Fludrocortisone for orthostatic hypotension. Plan was to get an exercise treadmill test to evaluate for chronotropic incompetence once bike testing is available. Plan was also to get a coronary CTA if he has recurrent chest pain. Fludrocortisone was subsequently stopped and he was started on Midodrine instead.  Patient presents today for follow-up. Here alone. Patient continues to have symptomatic hypotension. He is not able to stand for long periods of time due to significant weakness and lightheadedness as well as unsteadiness on his feet. Symptoms seem to be worse in the morning, and he feels better at the end of the day. Last night he states he was standing and shaving when his neck got really stiff and he started have pain in his arm and significant weakness. He has not had any other events like this. He continues to have some chest fullness when he is using an exercise machine that requires a lot of movement of his arms. However, this is not new and is stable. He denies any recurrent episodes of chest pain that sound like angina. He denies any shortness of breath, orthopnea, PND, or palpitations. He does have some lower extremity edema (mostly in his left leg after bypass surgery) but this is stable. No syncope.   He was unable to tolerate Fludrocortisone due to lower extremity edema. He is currently taking Midodrine 5mg  three times daily. He typically wakes up at 7:30am and then will get up and make coffee. He does not typically take his first dose of Midodrine until 8:30-9:30am. He takes he second dose of Midodrine around  1-2pm and his third dose around 8:30-9:30pm. He will then usually go to bed around 10pm.   His orthostatic vitals signs were positive today: - Supine: BP 173/64, HR 51 - Sitting: BP 164/70, HR 51 - Standing:  BP 11563,  HR 64 ----> no lightheadedness/ dizziness with this but felt very unsteady  EKGs/Labs/Other Studies Reviewed:    The  following studies were reviewed:  Echocardiogram 10/11/2022: Impressions: 1. Left ventricular ejection fraction, by estimation, is 55 to 60%. The  left ventricle has normal function. The left ventricle has no regional  wall motion abnormalities. Left ventricular diastolic parameters are  consistent with Grade I diastolic  dysfunction (impaired relaxation).   2. Right ventricular systolic function is normal. The right ventricular  size is normal. There is normal pulmonary artery systolic pressure.   3. Left atrial size was mildly dilated.   4. The mitral valve is normal in structure. Mild mitral valve  regurgitation. No evidence of mitral stenosis.   5. The aortic valve is normal in structure. Aortic valve regurgitation is  not visualized. No aortic stenosis is present.   6. The inferior vena cava is normal in size with greater than 50%  respiratory variability, suggesting right atrial pressure of 3 mmHg.  _______________  Monitor 10/17/2022 to 10/31/2022:   Patient had a minimum heart rate of 39 bpm, maximum heart rate of 175 bpm, and average heart rate of 52 bpm.   Predominant underlying rhythm was sinus rhythm.   Frequent SVT, most 5-6 beats but longest 24 seconds   Isolated PACs were rare (<1.0%).   Isolated PVCs were rare (<1.0%).   No high grade AV block.   Triggered and diary events associated with sinus rhythm.   Asymptomatic SVT. _______________  Lower Extremity Ultrasound and ABIs 01/24/2023: Summary: Left: Patent graft with no stenosis. Mid graft velocity <40 cm/s with a  biphasic waveform.   ABI Summary: Right: Resting right ankle-brachial index indicates mild right lower  extremity arterial disease. The right toe-brachial index is abnormal.   Left: Resting left ankle-brachial index is within normal range. The left  toe-brachial index is normal.    EKG:  EKG not ordered today.   Recent Labs: 10/10/2022: Magnesium 2.1; TSH 0.731 10/11/2022: ALT 17; Hemoglobin  12.5; Platelets 172 11/01/2022: BUN 30; Creatinine, Ser 1.21; Potassium 4.7; Sodium 138  Recent Lipid Panel    Component Value Date/Time   CHOL (H) 03/15/2008 0445    207        ATP III CLASSIFICATION:  <200     mg/dL   Desirable  161-096  mg/dL   Borderline High  >=045    mg/dL   High          TRIG 409 (H) 03/15/2008 0445   HDL 31 (L) 03/15/2008 0445   CHOLHDL 6.7 03/15/2008 0445   VLDL 33 03/15/2008 0445   LDLCALC (H) 03/15/2008 0445    143        Total Cholesterol/HDL:CHD Risk Coronary Heart Disease Risk Table                     Men   Women  1/2 Average Risk   3.4   3.3  Average Risk       5.0   4.4  2 X Average Risk   9.6   7.1  3 X Average Risk  23.4   11.0        Use the calculated Patient Ratio above and the CHD  Risk Table to determine the patient's CHD Risk.        ATP III CLASSIFICATION (LDL):  <100     mg/dL   Optimal  161-096  mg/dL   Near or Above                    Optimal  130-159  mg/dL   Borderline  045-409  mg/dL   High  >811     mg/dL   Very High    Physical Exam:    Vital Signs: BP 132/62 (BP Location: Left Arm, Patient Position: Sitting, Cuff Size: Normal)   Pulse (!) 57   Ht 5\' 9"  (1.753 m)   Wt 179 lb 12.8 oz (81.6 kg)   SpO2 97%   BMI 26.55 kg/m     Wt Readings from Last 3 Encounters:  02/06/23 179 lb 12.8 oz (81.6 kg)  01/27/23 179 lb 11.2 oz (81.5 kg)  10/25/22 182 lb (82.6 kg)     General: 89 y.o. male in no acute distress. HEENT: Normocephalic and atraumatic. Sclera clear.  Neck: Supple. No JVD. Heart: RRR. Distinct S1 and S2. No murmurs, gallops, or rubs.  Lungs: No increased work of breathing. Clear to ausculation bilaterally. No wheezes, rhonchi, or rales.  Extremities: Trace edema of left lower extremity.  Skin: Warm and dry. Neuro: No focal deficits. Psych: Normal affect. Responds appropriately.  Assessment:    1. Orthostatic hypotension   2. History of hypertension   3. Bradycardia   4. Paroxysmal SVT  (supraventricular tachycardia) (HCC)   5. PAD (peripheral artery disease) (HCC)   6. Hyperlipidemia, unspecified hyperlipidemia type   7. Stage 3a chronic kidney disease (HCC)     Plan:    Orthostatic Hypotension History of Hypertension Patient has a history of hypertension but more recently has had issues with symptomatic orthostatic hypotension.  - He continues to have symptomatic orthostatic hypotension - Unable to tolerate Florinef due to lower extremity edema.  - Currently on Midodrine 5mg  three times daily. Hesitant to increase this due to supine hypertension. - Will continue current dose of Midodrine. However, advised patient to take morning dose of Midodrine first thing in the morning before he even gets out of bed. Also recommended he take evening dose 2 hours before he lays down to help with supine hypertension.  - Recommend switching from knee high compression stockings to thigh high compression stockings. Also recommended abdominal binder.  - Discussed importance of staying well hydrated. Liberal salt intake okay as well.   Bradycardia Paroxysmal SVT Monitor in 10/2022 showed predominant sinus rhythm with average heart rate of 52 bpm with frequent SVT (most runs were 5-6 beats long but long run was 24 seconds) and isolated PACs/ PVCs but no high grade AV block or pauses.  - Continue to avoid AV nodal agents.  - Per Dr. Debby Bud last note, plan was to get an exercise treadmill test to evaluate for chronotropic incompetence once bike testing is available in our office. Will check to see if we have bike testing available and can arrange at follow-up visit if so.   PAD S/p left femoral-tibial bypass in 2014. Recent lower extremity dopplers on 01/24/2023 showed patent bypass graft with mid graft velocity <40 cm/s with a biphasic waveform. ABIs were normal on the left (1.02) and mildly reduce on the right (0.86).  - Continue Plavix 75mg  daily. - Continue  statin.  Hyperlipidemia Lipid panel in 07/2022 (per [facility]): Total Cholesterol 131, Triglycerides 92, HLD 53,  LDL 60. LDL goal <70 given PAD.  - Continue Crestor 10mg  daily.  CKD Stage IIIa Baseline creatinine around 1.2 to 1.3. Stable at 1.2 in 10/2022.   Disposition: Follow up with me in April 2025.   Signed, Corrin Parker, PA-C  02/06/2023 8:21 PM    [facility]

## 2023-01-27 ENCOUNTER — Encounter (HOSPITAL_COMMUNITY): Payer: Medicare Other

## 2023-01-27 ENCOUNTER — Other Ambulatory Visit (HOSPITAL_COMMUNITY): Payer: Medicare Other

## 2023-01-27 ENCOUNTER — Ambulatory Visit: Payer: Medicare Other | Admitting: Physician Assistant

## 2023-01-27 VITALS — BP 188/80 | HR 50 | Temp 97.5°F | Resp 18 | Ht 69.0 in | Wt 179.7 lb

## 2023-01-27 DIAGNOSIS — Z95828 Presence of other vascular implants and grafts: Secondary | ICD-10-CM

## 2023-01-27 DIAGNOSIS — I779 Disorder of arteries and arterioles, unspecified: Secondary | ICD-10-CM | POA: Diagnosis not present

## 2023-01-27 NOTE — Progress Notes (Unsigned)
Office Note   History of Present Illness   Walter [name] is a 89 y.o. (Jan 07, 1934) male who presents for surveillance of PAD.  He has a remote history of left common femoral to above-knee popliteal artery bypass with vein in November 2015 by Dr. [name].  This was done for critical limb ischemia with left toe ulceration.  He has not had any wound issues since then.  He does have chronic issues with bilateral lower extremity swelling.  He returns today for follow-up.  He states he has been having a hard time lately with his arthritis.  He is seeing an orthopedic doctor for his back and hip pain.  This typically limits his amount of walking.  He denies any claudication, rest pain, or tissue loss.   Current Outpatient Medications  Medication Sig Dispense Refill   acetaminophen (TYLENOL) 500 MG tablet Take 1,000 mg by mouth at bedtime.     alum & mag hydroxide-simeth (MAALOX/MYLANTA) 200-200-20 MG/5ML suspension Take 15 mLs by mouth daily.     clopidogrel (PLAVIX) 75 MG tablet Take 1 tablet (75 mg total) by mouth daily. 15 tablet 0   feeding supplement (BOOST HIGH PROTEIN) LIQD Take 1 Container by mouth 2 (two) times daily between meals.     levothyroxine (SYNTHROID, LEVOTHROID) 125 MCG tablet Take 125 mcg by mouth at bedtime.     midodrine (PROAMATINE) 5 MG tablet Take 1 tablet (5 mg total) by mouth 3 (three) times daily with meals. 63 tablet 2   Omega 3-6-9 Fatty Acids (OMEGA 3-6-9 COMPLEX PO) Take 2 capsules by mouth daily.     omeprazole (PRILOSEC) 40 MG capsule Take 1 capsule (40 mg total) by mouth daily. Office visit for further refills 100 capsule 2   rosuvastatin (CRESTOR) 10 MG tablet Take 10 mg by mouth daily.     Current Facility-Administered Medications  Medication Dose Route Frequency Provider Last Rate Last Admin   methylPREDNISolone acetate (DEPO-MEDROL) injection 40 mg  40 mg Other Once         REVIEW OF SYSTEMS (negative unless checked):   Cardiac:  []  Chest pain or  chest pressure? []  Shortness of breath upon activity? []  Shortness of breath when lying flat? []  Irregular heart rhythm?  Vascular:  []  Pain in calf, thigh, or hip brought on by walking? []  Pain in feet at night that wakes you up from your sleep? []  Blood clot in your veins? []  Leg swelling?  Pulmonary:  []  Oxygen at home? []  Productive cough? []  Wheezing?  Neurologic:  []  Sudden weakness in arms or legs? []  Sudden numbness in arms or legs? []  Sudden onset of difficult speaking or slurred speech? []  Temporary loss of vision in one eye? []  Problems with dizziness?  Gastrointestinal:  []  Blood in stool? []  Vomited blood?  Genitourinary:  []  Burning when urinating? []  Blood in urine?  Psychiatric:  []  Major depression  Hematologic:  []  Bleeding problems? []  Problems with blood clotting?  Dermatologic:  []  Rashes or ulcers?  Constitutional:  []  Fever or chills?  Ear/Nose/Throat:  []  Change in hearing? []  Nose bleeds? []  Sore throat?  Musculoskeletal:  []  Back pain? []  Joint pain? []  Muscle pain?   Physical Examination   Vitals:   01/27/23 1459  BP: (!) 188/80  Pulse: (!) 50  Resp: 18  Temp: (!) 97.5 F (36.4 C)  TempSrc: Temporal  SpO2: 98%  Weight: 179 lb 11.2 oz (81.5 kg)  Height: 5\' 9"  (1.753 m)  Body mass index is 26.54 kg/m.  General:  WDWN in NAD; vital signs documented above Gait: Not observed HENT: WNL, normocephalic Pulmonary: normal non-labored breathing , without rales, rhonchi,  wheezing Cardiac: regular Abdomen: soft, NT, no masses Skin: without rashes Vascular Exam/Pulses: Palpable femoral pulses.  2+ left DP and 2+ right PT Extremities: without ischemic changes, without gangrene , without cellulitis; without open wounds;  Musculoskeletal: no muscle wasting or atrophy  Neurologic: A&O X 3;  No focal weakness or paresthesias are detected Psychiatric:  The pt has Normal affect.  Non-Invasive Vascular Imaging ABI  (01/24/2023) R:  ABI: 0.86 (0.94),  PT: bi DP: bi TBI:  0.61 L:  ABI: 1.02 (0.96),  PT: bi DP: bi TBI: 0.91  LLE Bypass Duplex (01/24/2023) Left Graft #1: AK to BK pop  +--------------------+--------+--------+----------+--------+                     PSV cm/sStenosisWaveform  Comments  +--------------------+--------+--------+----------+--------+  Inflow             69              biphasic            +--------------------+--------+--------+----------+--------+  Proximal Anastomosis104             monophasic          +--------------------+--------+--------+----------+--------+  Proximal Graft      74              biphasic            +--------------------+--------+--------+----------+--------+  Mid Graft           37              biphasic            +--------------------+--------+--------+----------+--------+  Distal Graft        49              biphasic            +--------------------+--------+--------+----------+--------+  Distal Anastomosis  56              biphasic            +--------------------+--------+--------+----------+--------+  Outflow            49              biphasic            +--------------------+--------+--------+----------+--------+     Medical Decision Making   CHAP BALDIVIA is a 89 y.o. male who presents for surveillance of PAD  Based on the patient's vascular studies and examination, his ABIs are essentially unchanged bilaterally.  His right ABI 0.86 and left ABI is 1.02 Duplex demonstrates a patent left above-knee popliteal artery to below-knee popliteal artery bypass.  There are decreased velocities in the graft, less than 40 cm/s.  This has been present for his past 2 studies.  He continues to have biphasic flow throughout the graft He denies any rest pain, claudication, or tissue loss.  He has palpable pedal pulses He can follow-up with our office again in 6 months with repeat ABIs and left lower  extremity bypass graft duplex   Loel Dubonnet PA-C [facility] Office: [phone]  Clinic MD: [name]

## 2023-02-06 ENCOUNTER — Encounter: Payer: Self-pay | Admitting: Student

## 2023-02-06 ENCOUNTER — Ambulatory Visit: Payer: Medicare Other | Attending: Student | Admitting: Student

## 2023-02-06 VITALS — BP 132/62 | HR 57 | Ht 69.0 in | Wt 179.8 lb

## 2023-02-06 DIAGNOSIS — Z8679 Personal history of other diseases of the circulatory system: Secondary | ICD-10-CM | POA: Diagnosis not present

## 2023-02-06 DIAGNOSIS — N1831 Chronic kidney disease, stage 3a: Secondary | ICD-10-CM

## 2023-02-06 DIAGNOSIS — I471 Supraventricular tachycardia, unspecified: Secondary | ICD-10-CM | POA: Diagnosis not present

## 2023-02-06 DIAGNOSIS — I739 Peripheral vascular disease, unspecified: Secondary | ICD-10-CM

## 2023-02-06 DIAGNOSIS — R001 Bradycardia, unspecified: Secondary | ICD-10-CM

## 2023-02-06 DIAGNOSIS — I951 Orthostatic hypotension: Secondary | ICD-10-CM

## 2023-02-06 DIAGNOSIS — E785 Hyperlipidemia, unspecified: Secondary | ICD-10-CM

## 2023-02-06 NOTE — Patient Instructions (Signed)
Medication Instructions:  1-TAKE YOUR MORNING DOSE OF MIDODRINE IN THE AM WHEN YOU WAKE UP 1 HOUR BEFORE YOU GET UP  2-YOUR MID-DAY DOSE IS OK TO TAKE LIKE YOU ARE NOW 3-EVENING DOSE 2 HOURS BEFORE BED *If you need a refill on your cardiac medications before your next appointment, please call your pharmacy*  Lab Work: NONE  Other Instructions PURCHASE AND WEAR ABDOMINAL BINDER STAY HYDRATED LIBERALIZE SALT PURCHASE AND WEAR THIGH HIGH COMPRESSION STOCKINGS  Follow-Up: At [facility], you and your health needs are our priority.  As part of our continuing mission to provide you with exceptional heart care, we have created designated Provider Care Teams.  These Care Teams include your primary Cardiologist (physician) and Advanced Practice Providers (APPs -  Physician Assistants and Nurse Practitioners) who all work together to provide you with the care you need, when you need it.  Your next appointment:   APRIL   Provider:   Marjie Skiff, PA-C

## 2023-02-18 ENCOUNTER — Other Ambulatory Visit: Payer: Self-pay

## 2023-02-18 DIAGNOSIS — I779 Disorder of arteries and arterioles, unspecified: Secondary | ICD-10-CM

## 2023-04-06 ENCOUNTER — Other Ambulatory Visit: Payer: Self-pay | Admitting: Internal Medicine

## 2023-04-07 NOTE — Progress Notes (Unsigned)
 Cardiology Office Note:    Date:  04/15/2023   ID:  Walter [name], DOB [birth year], MRN [id number]  PCP:  Asencion Gowda.August Saucer, MD (Inactive)  Cardiologist:  Christell Constant, MD     Referring MD: No ref. provider found   Chief Complaint: follow-up of orthostatic hypotension  History of Present Illness:    Walter [name] is a 89 y.o. male with a history of  bradycardia, PAD s/p left femoral-tibial bypass in 2014, hypertension complicated by orthostatic hypotension, hyperlipidemia, hypothyroidism, GERD with Barrett's esophagus, CKD stage IIIa, and anxiety/ depression who is followed by Dr. Izora Ribas and presents today for follow-up of orthostatic hypotension.  Patient was previously seen by Dr. Eldridge Dace in 03/2014 for PAD. Peripheral angiogram in 2014 showed severe right distal SFA disease and an occluded left popliteal artery as well as bilateral tibial disease. He underwent a left femoral-tibial bypass in 11/2012. His PAD is now followed by Vascular Surgery.   He was seen by Dr. Izora Ribas in 10/2022 after not being seen since 2016 for further evaluation of bradycardia after a recent admission for near syncope earlier that month. He was noted to be orthostatic at that time. However, he was also noted to be bradycardic so there was concern for symptomatic bradycardia. Echo showed LVEF of 55-60% with normal wall motion and grade 1 diastolic dysfunction, normal RV size and function, and mild MR. Outpatient Zio monitor was ordered and showed predominant sinus rhythm with average heart rate of 52 bpm with frequent SVT (most runs were 5-6 beats long but long run was 24 seconds) and isolated PACs/ PVCs but no high grade AV block or pauses. At the visit with Dr. Izora Ribas, he reported persistent dizziness only when standing. He also reported a recent episode of chest pain that could be angina (that was different the the usual pain he has when using the exercise machine). He was started on  Fludrocortisone for orthostatic hypotension. Plan was to get an exercise treadmill test to evaluate for chronotropic incompetence once bike testing is available. Plan was also to get a coronary CTA if he has recurrent chest pain. Fludrocortisone was subsequently stopped and he was started on Midodrine instead.   He was last seen by me in 01/2023 at which time her continue to have symptomatic orthostatic hypotension. Symptoms were worse in the morning and improved throughout the day. He continued to have chest fullness when using an exercise machine that requires a lot of movement of his arms but this was stable. He denies any recurrent chest pain that sounded like angina. Midodrine was not increased due to concern for supine hypertension. However, he was advised to take first dose of Midodrine first thing in the morning before he even gets our of bed. Thigh high compression stockings and abdominal binder were also recommended.   Patient presents today for follow-up. He is doing much better compared to last visit. He stopped taking the Midodrine but is not having any more orthostatic symptoms. No lightheadedness, dizziness, or near syncope.  He is wearing the thigh compression stockings which have helped but was not able to tolerate the abdominal binder.  He continues to have the chest tightness only when he is using his exercise machine that requires a lot of movement of his arms but this is unchanged and is stable. Likely musculoskeletal. No other chest pain. He states he get a little winded with this exercise as well but this is not new. No other shortness of breath. No orthopnea  or PND. He has chronic edema in his left leg which is stable. No palpitations.  Orthostatic vital signs remain positive in the office today with systolic BP dropping from 184 supine to 130 with standing. However, he is asymptomatic with this.   EKGs/Labs/Other Studies Reviewed:    The following studies were  reviewed:  Echocardiogram 10/11/2022: Impressions: 1. Left ventricular ejection fraction, by estimation, is 55 to 60%. The  left ventricle has normal function. The left ventricle has no regional  wall motion abnormalities. Left ventricular diastolic parameters are  consistent with Grade I diastolic  dysfunction (impaired relaxation).   2. Right ventricular systolic function is normal. The right ventricular  size is normal. There is normal pulmonary artery systolic pressure.   3. Left atrial size was mildly dilated.   4. The mitral valve is normal in structure. Mild mitral valve  regurgitation. No evidence of mitral stenosis.   5. The aortic valve is normal in structure. Aortic valve regurgitation is  not visualized. No aortic stenosis is present.   6. The inferior vena cava is normal in size with greater than 50%  respiratory variability, suggesting right atrial pressure of 3 mmHg.  _______________   Monitor 10/17/2022 to 10/31/2022:   Patient had a minimum heart rate of 39 bpm, maximum heart rate of 175 bpm, and average heart rate of 52 bpm.   Predominant underlying rhythm was sinus rhythm.   Frequent SVT, most 5-6 beats but longest 24 seconds   Isolated PACs were rare (<1.0%).   Isolated PVCs were rare (<1.0%).   No high grade AV block.   Triggered and diary events associated with sinus rhythm.   Asymptomatic SVT. _______________   Lower Extremity Ultrasound and ABIs 01/24/2023: Summary: Left: Patent graft with no stenosis. Mid graft velocity <40 cm/s with a  biphasic waveform.    ABI Summary: Right: Resting right ankle-brachial index indicates mild right lower  extremity arterial disease. The right toe-brachial index is abnormal.   Left: Resting left ankle-brachial index is within normal range. The left  toe-brachial index is normal.   EKG:  EKG not ordered today.   Recent Labs: 10/10/2022: Magnesium 2.1; TSH 0.731 10/11/2022: ALT 17; Hemoglobin 12.5; Platelets  172 11/01/2022: BUN 30; Creatinine, Ser 1.21; Potassium 4.7; Sodium 138  Recent Lipid Panel    Component Value Date/Time   CHOL (H) 03/15/2008 0445    207        ATP III CLASSIFICATION:  <200     mg/dL   Desirable  643-329  mg/dL   Borderline High  >=518    mg/dL   High          TRIG 841 (H) 03/15/2008 0445   HDL 31 (L) 03/15/2008 0445   CHOLHDL 6.7 03/15/2008 0445   VLDL 33 03/15/2008 0445   LDLCALC (H) 03/15/2008 0445    143        Total Cholesterol/HDL:CHD Risk Coronary Heart Disease Risk Table                     Men   Women  1/2 Average Risk   3.4   3.3  Average Risk       5.0   4.4  2 X Average Risk   9.6   7.1  3 X Average Risk  23.4   11.0        Use the calculated Patient Ratio above and the CHD Risk Table to determine the patient's CHD Risk.  ATP III CLASSIFICATION (LDL):  <100     mg/dL   Optimal  161-096  mg/dL   Near or Above                    Optimal  130-159  mg/dL   Borderline  045-409  mg/dL   High  >811     mg/dL   Very High    Physical Exam:    Vital Signs: BP (!) 150/68   Ht 5\' 9"  (1.753 m)   Wt 182 lb (82.6 kg)   SpO2 96%   BMI 26.88 kg/m     Orthostatic Vital Signs: Supine:  BP 180/70, P 54,  Sitting:   BP 174/71, P 54 Standing:  BP 130/66, P 60  Wt Readings from Last 3 Encounters:  04/15/23 182 lb (82.6 kg)  02/06/23 179 lb 12.8 oz (81.6 kg)  01/27/23 179 lb 11.2 oz (81.5 kg)     General: 89 y.o. Caucasian male in no acute distress. HEENT: Normocephalic and atraumatic. Sclera clear.  Neck: Supple. No JVD. Heart: RRR. No murmurs, gallops, or rubs.  Lungs: No increased work of breathing. Clear to ausculation bilaterally. No wheezes, rhonchi, or rales.  Abdomen: Soft, non-distended, and non-tender to palpation.  Extremities: Chronic mild edema of left lower extremity. Thigh high compressions stockings in place.  Skin: Warm and dry. Neuro: No focal deficits. Psych: Normal affect. Responds appropriately.   Assessment:     1. Orthostatic hypotension   2. Primary hypertension   3. Atypical chest pain   4. Bradycardia   5. Paroxysmal SVT (supraventricular tachycardia) (HCC)   6. PAD (peripheral artery disease) (HCC)   7. Hyperlipidemia, unspecified hyperlipidemia type   8. Stage 3a chronic kidney disease (HCC)     Plan:    Orthostatic Hypotension Hypertension Patient has a history of hypertension complicated by symptomatic orthostatic hypotension.  - Orthostatic vital signs remain positive today. However, he is completely asymptomatic with this.  - Unable to tolerate Florinef due to lower extremity edema.  - He stopped the Midodrine on his own. Can remain off this given he is asymptomatic.  - Continue thigh high compression stockings (he was unable to tolerate the abdominal binder). Continue staying well hydrated and changing positions slowly.   - Asked patient to keep a log of BP after laying down for 10 minutes to help as assess for supine hypertension (systolic BP was 184 when supine in the office today).   Atypical Chest Pain Patient continues to the upper chest tightness near shoulder when he is using exercise machine that requires a lot of arm movements. He has had this for a long time and this is stable. No other chest pain.  - Does not sound cardiac in nature. Sounds musculoskeletal. It is not bothersome to him. No additional ischemic work-up necessary for this at this time.    Bradycardia Paroxysmal SVT Monitor in 10/2022 showed predominant sinus rhythm with average heart rate of 52 bpm with frequent SVT (most runs were 5-6 beats long but long run was 24 seconds) and isolated PACs/ PVCs but no high grade AV block or pauses.  - Heart rates in the 50s to 60s today. - Continue to avoid AV nodal agents.  - Per Dr. Debby Bud last note, plan was to get an exercise treadmill test to evaluate for chronotropic incompetence once bike testing is available in our office. Can see if this is an option  at follow-up building once we are in our  new building.    PAD S/p left femoral-tibial bypass in 2014. Recent lower extremity dopplers on 01/24/2023 showed patent bypass graft with mid graft velocity <40 cm/s with a biphasic waveform. ABIs were normal on the left (1.02) and mildly reduce on the right (0.86).  - Continue Plavix 75mg  daily. - Continue statin. - Followed by Vascular Surgery.   Hyperlipidemia Lipid panel in 07/2022 (per [facility]): Total Cholesterol 131, Triglycerides 92, HLD 53, LDL 60. LDL goal <70 given PAD.  - Continue Crestor 10mg  daily.   CKD Stage IIIa Baseline creatinine around 1.2 to 1.3. Stable at 1.2 in 10/2022.   Disposition: Follow up in 3 months.    Signed, Corrin Parker, PA-C  04/16/2023 9:28 AM    [facility]

## 2023-04-15 ENCOUNTER — Ambulatory Visit: Payer: Medicare Other | Attending: Student | Admitting: Student

## 2023-04-15 ENCOUNTER — Encounter: Payer: Self-pay | Admitting: Student

## 2023-04-15 VITALS — BP 150/68 | Ht 69.0 in | Wt 182.0 lb

## 2023-04-15 DIAGNOSIS — I739 Peripheral vascular disease, unspecified: Secondary | ICD-10-CM

## 2023-04-15 DIAGNOSIS — I951 Orthostatic hypotension: Secondary | ICD-10-CM | POA: Diagnosis not present

## 2023-04-15 DIAGNOSIS — N1831 Chronic kidney disease, stage 3a: Secondary | ICD-10-CM

## 2023-04-15 DIAGNOSIS — E785 Hyperlipidemia, unspecified: Secondary | ICD-10-CM

## 2023-04-15 DIAGNOSIS — I10 Essential (primary) hypertension: Secondary | ICD-10-CM | POA: Diagnosis not present

## 2023-04-15 DIAGNOSIS — R001 Bradycardia, unspecified: Secondary | ICD-10-CM

## 2023-04-15 DIAGNOSIS — R0789 Other chest pain: Secondary | ICD-10-CM | POA: Diagnosis not present

## 2023-04-15 DIAGNOSIS — I471 Supraventricular tachycardia, unspecified: Secondary | ICD-10-CM

## 2023-04-15 NOTE — Patient Instructions (Signed)
 Medication Instructions:  NO CHANGES *If you need a refill on your cardiac medications before your next appointment, please call your pharmacy*  Lab Work: NO LABS If you have labs (blood work) drawn today and your tests are completely normal, you will receive your results only by: MyChart Message (if you have MyChart) OR A paper copy in the mail If you have any lab test that is abnormal or we need to change your treatment, we will call you to review the results.  Testing/Procedures: NO TESTING  Follow-Up: At [facility], you and your health needs are our priority.  As part of our continuing mission to provide you with exceptional heart care, our providers are all part of one team.  This team includes your primary Cardiologist (physician) and Advanced Practice Providers or APPs (Physician Assistants and Nurse Practitioners) who all work together to provide you with the care you need, when you need it.  Your next appointment:   3 month(s)  Provider:   Christell Constant, MD or  Marjie Skiff, PA   Other Instructions CHECK BLOOD PRESSURE DAILY FOR 1 WEEK AFTER LAYING DOWN FOR 10 MINUTES KEEP RECORD AND SEND VIA MYCHART OR BRING TO OFFICE.      1st Floor: - Lobby - Registration  - Pharmacy  - Lab - Cafe  2nd Floor: - PV Lab - Diagnostic Testing (echo, CT, nuclear med)  3rd Floor: - Vacant  4th Floor: - TCTS (cardiothoracic surgery) - AFib Clinic - Structural Heart Clinic - Vascular Surgery  - Vascular Ultrasound  5th Floor: - [facility] Cardiology (general and EP) - Clinical Pharmacy for coumadin, hypertension, lipid, weight-loss medications, and med management appointments    Valet parking services will be available as well.

## 2023-04-16 ENCOUNTER — Encounter: Payer: Self-pay | Admitting: Student

## 2023-05-15 ENCOUNTER — Other Ambulatory Visit: Payer: Self-pay | Admitting: Internal Medicine

## 2023-07-02 ENCOUNTER — Encounter: Payer: Self-pay | Admitting: Podiatry

## 2023-07-02 ENCOUNTER — Ambulatory Visit: Admitting: Podiatry

## 2023-07-02 DIAGNOSIS — M79671 Pain in right foot: Secondary | ICD-10-CM

## 2023-07-02 DIAGNOSIS — B351 Tinea unguium: Secondary | ICD-10-CM

## 2023-07-02 DIAGNOSIS — M79672 Pain in left foot: Secondary | ICD-10-CM

## 2023-07-02 NOTE — Progress Notes (Signed)
 Patient presents for evaluation and treatment of tenderness and some redness around nails feet.  Tenderness around toes with walking and wearing shoes.  Physical exam:  General appearance: Alert, pleasant, and in no acute distress.  Vascular: Pedal pulses: DP 2/4 B/L, PT 2/4 B/L.  Mild edema lower legs bilaterally  Neurological:    Dermatologic:  Nails thickened, disfigured, discolored 2-5 BL with subungual debris.  Redness and hypertrophic nail folds along nail folds bilaterally but no signs of drainage or infection.  Musculoskeletal:     Diagnosis: 1. Painful onychomycotic nails 1 through 5 bilaterally. 2. Pain toes 2 through 5 bilaterally.  Plan: Debrided onychomycotic nails 1 through 5 bilaterally.  Return 3 months RFC

## 2023-07-07 DIAGNOSIS — N1831 Chronic kidney disease, stage 3a: Secondary | ICD-10-CM | POA: Diagnosis not present

## 2023-07-07 DIAGNOSIS — E039 Hypothyroidism, unspecified: Secondary | ICD-10-CM | POA: Diagnosis not present

## 2023-07-07 DIAGNOSIS — E782 Mixed hyperlipidemia: Secondary | ICD-10-CM | POA: Diagnosis not present

## 2023-07-07 DIAGNOSIS — M150 Primary generalized (osteo)arthritis: Secondary | ICD-10-CM | POA: Diagnosis not present

## 2023-07-09 NOTE — Progress Notes (Signed)
 Cardiology Office Note:    Date:  07/15/2023   ID:  Walter [name], DOB 10/10/1933, MRN [id number]  PCP:  Leonel Cole, MD  Cardiologist:  Stanly DELENA Leavens, MD     Referring MD: No ref. provider found   Chief Complaint: routine follow-up of orthostatic hypotension  History of Present Illness:    Walter [name] is a 89 y.o. male with a history of bradycardia, PAD s/p left femoral-tibial bypass in 2014, hypertension complicated by orthostatic hypotension, hyperlipidemia, hypothyroidism, GERD with Barrett's esophagus, CKD stage IIIa, and anxiety/ depression who is followed by Dr. Leavens and presents today for routine follow-up.   Patient was previously seen by Dr. Dann in 03/2014 for PAD. Peripheral angiogram in 2014 showed severe right distal SFA disease and an occluded left popliteal artery as well as bilateral tibial disease. He underwent a left femoral-tibial bypass in 11/2012. His PAD is now followed by Vascular Surgery.    He was seen by Dr. Leavens in 10/2022 after not being seen since 2016 for further evaluation of bradycardia after a recent admission for near syncope earlier that month. He was noted to be orthostatic at that time. However, he was also noted to be bradycardic so there was concern for symptomatic bradycardia. Echo showed LVEF of 55-60% with normal wall motion and grade 1 diastolic dysfunction, normal RV size and function, and mild MR. Outpatient Zio monitor was ordered and showed predominant sinus rhythm with average heart rate of 52 bpm with frequent SVT (most runs were 5-6 beats long but long run was 24 seconds) and isolated PACs/ PVCs but no high grade AV block or pauses. At the visit with Dr. Leavens, he reported persistent dizziness only when standing. He also reported a recent episode of chest pain that could be angina (that was different the the usual pain he has when using the exercise machine). He was started on Fludrocortisone  for  orthostatic hypotension. Plan was to get an exercise treadmill test to evaluate for chronotropic incompetence once bike testing is available. Plan was also to get a coronary CTA if he has recurrent chest pain. Fludrocortisone  was subsequently stopped and he was started on Midodrine  instead.   He was last seen by me in 04/2023 at which time he had stopped taking Midodrine  but was not having any more orthostatic symptoms. His orthostatic vital signs remained positive with systolic BP dropping from 184 supine to 130 with standing. However, he was asymptomatic with this. He continued to report upper chest tightness when using an exercise machine that requires a lot of arm movement but no angina. He was advised to remain off Midodrine  and continue conservative measures for orthostatic hypotension given he was asymptomatic.   Patient presents today for follow-up. Patient reports significant new significant dyspnea on exertion since last visit. He states after walking 200 ft, he is ready to sit down. He is having difficulty rolling his trash-can out to the road and has stopped filling up his bird feeders because he gets so weak and short of breath. This is all new for him over the last couple of months. He denies any shortness of breath at rest. No orthopnea or PND. He has chronic left lower extremity edema since his femoral-tibial bypass surgery in 2014 but this is stable. He continues to have some atypical chest pain when using the upper arm exercise machine. However, he states he has not been able to use this as much do to worsening back pain. He used to  do this exercise almost on a daily basis. Now that he is not doing it as much, he actually has noticed improved in this atypical chest pain. This sounds musculoskeletal in nature. He does not described any chest pain that sounds like angina. No palpitations, lightheadedness, dizziness, or near syncope/ syncope.   In regards to his orthostatic hypotension, he  states he had a bad day yesterday where he woke up and did not feel well. He states his BP was low in the morning but gradually improved throughout the day. He had an appointment with Vascular Surgery yesterday afternoon and his BP had improved to 140/81 at that time and he was feeling better. He denies any other episodes of symptomatic low BP.  EKGs/Labs/Other Studies Reviewed:    The following studies were reviewed:  Echocardiogram 10/11/2022: Impressions: 1. Left ventricular ejection fraction, by estimation, is 55 to 60%. The  left ventricle has normal function. The left ventricle has no regional  wall motion abnormalities. Left ventricular diastolic parameters are  consistent with Grade I diastolic  dysfunction (impaired relaxation).   2. Right ventricular systolic function is normal. The right ventricular  size is normal. There is normal pulmonary artery systolic pressure.   3. Left atrial size was mildly dilated.   4. The mitral valve is normal in structure. Mild mitral valve  regurgitation. No evidence of mitral stenosis.   5. The aortic valve is normal in structure. Aortic valve regurgitation is  not visualized. No aortic stenosis is present.   6. The inferior vena cava is normal in size with greater than 50%  respiratory variability, suggesting right atrial pressure of 3 mmHg.  _______________   Monitor 10/17/2022 to 10/31/2022:   Patient had a minimum heart rate of 39 bpm, maximum heart rate of 175 bpm, and average heart rate of 52 bpm.   Predominant underlying rhythm was sinus rhythm.   Frequent SVT, most 5-6 beats but longest 24 seconds   Isolated PACs were rare (<1.0%).   Isolated PVCs were rare (<1.0%).   No high grade AV block.   Triggered and diary events associated with sinus rhythm.   Asymptomatic SVT. _______________   Lower Extremity Ultrasound and ABIs 01/24/2023: Summary: Left: Patent graft with no stenosis. Mid graft velocity <40 cm/s with a  biphasic  waveform.    ABI Summary: Right: Resting right ankle-brachial index indicates mild right lower  extremity arterial disease. The right toe-brachial index is abnormal.   Left: Resting left ankle-brachial index is within normal range. The left  toe-brachial index is normal.   EKG:  EKG ordered today.   EKG Interpretation Date/Time:  Tuesday July 15 2023 15:30:41 EDT Ventricular Rate:  62 PR Interval:    QRS Duration:  82 QT Interval:  398 QTC Calculation: 403 R Axis:   57  Text Interpretation: Normal sinus rhythm  New downsloping ST changes and abnormal T waves in leads III, aVF, and V5-V6. Confirmed by Josephmichael Lisenbee [phone]) on 07/15/2023 5:38:36 PM     Recent Labs: 10/10/2022: Magnesium 2.1; TSH 0.731 10/11/2022: ALT 17; Hemoglobin 12.5; Platelets 172 11/01/2022: BUN 30; Creatinine, Ser 1.21; Potassium 4.7; Sodium 138  Recent Lipid Panel    Component Value Date/Time   CHOL (H) 03/15/2008 0445    207        ATP III CLASSIFICATION:  <200     mg/dL   Desirable  799-760  mg/dL   Borderline High  >=759    mg/dL   High  TRIG 164 (H) 03/15/2008 0445   HDL 31 (L) 03/15/2008 0445   CHOLHDL 6.7 03/15/2008 0445   VLDL 33 03/15/2008 0445   LDLCALC (H) 03/15/2008 0445    143        Total Cholesterol/HDL:CHD Risk Coronary Heart Disease Risk Table                     Men   Women  1/2 Average Risk   3.4   3.3  Average Risk       5.0   4.4  2 X Average Risk   9.6   7.1  3 X Average Risk  23.4   11.0        Use the calculated Patient Ratio above and the CHD Risk Table to determine the patient's CHD Risk.        ATP III CLASSIFICATION (LDL):  <100     mg/dL   Optimal  899-870  mg/dL   Near or Above                    Optimal  130-159  mg/dL   Borderline  839-810  mg/dL   High  >809     mg/dL   Very High    Physical Exam:    Vital Signs: BP (!) 148/77 (BP Location: Left Arm, Patient Position: Sitting, Cuff Size: Normal)   Pulse 60   Ht 5' 9 (1.753 m)   Wt 181  lb 6.4 oz (82.3 kg)   SpO2 96%   BMI 26.79 kg/m     Wt Readings from Last 3 Encounters:  07/15/23 181 lb 6.4 oz (82.3 kg)  07/14/23 179 lb 14.4 oz (81.6 kg)  04/15/23 182 lb (82.6 kg)     General: 89 y.o. Caucasian male in no acute distress. HEENT: Normocephalic and atraumatic. Sclera clear.  Neck: Supple. No JVD. Heart: RRR. Distinct S1 and S2. No murmurs, gallops, or rubs.  Lungs: No increased work of breathing. Clear to ausculation bilaterally. No wheezes, rhonchi, or rales.  Extremities: Mild (trace to 1+) pitting edema of left lower extremity (chronic).  Radial pulses 2+ and equal bilaterally. Neuro: No focal deficits. Psych: Normal affect. Responds appropriately.   Assessment:    1. Orthostatic hypotension   2. Primary hypertension   3. Atypical chest pain   4. Bradycardia   5. Paroxysmal SVT (supraventricular tachycardia) (HCC)   6. PAD (peripheral artery disease) (HCC)   7. Hyperlipidemia, unspecified hyperlipidemia type   8. Stage 3a chronic kidney disease (HCC)   9. Preoperative cardiovascular examination   10. Dyspnea on exertion     Plan:    Dyspne on Exertion Abnormal EKG Patient reports significant dyspnea on exertion which is new over the last couple of month. He has chronic atypical chest pain that has been felt to be musculoskeletal in nature but no chest pain that sounds like angina.  - EKG today shows new downsloping ST depression and abnormal T waves in anterolateral leads.   - Euvolemic on exam. No other symptoms suggestive of CHF.  - Will order Echo. - He does have significant back/ hip pain which has started to limit his activity more so it is possible that deconditioning is playing a role in his dyspnea. However, I am concerned that his dyspnea may be an anginal equivalent. No recent ischemic evaluation but prior chest CT has shown 3 vessel CAD. Given EKG changes and significant new dyspnea compared to when I saw him  3 months ago, will arrange right/  left cardiac catheterization for further evaluation. Will check CBC and BMET today. Discussed this plan with his primary Cardiologist (Dr. Santo) who agreed.   R/ LHC has been scheduled for 07/21/2023 at 10:30am with Dr. Mady. Patient does not have anyone who can stay the night with him after procedure so will notify cath lab that he will need to stay the night.  Shared Decision Making/Informed Consent{ The risks [stroke (1 in 1000), death (1 in 1000), kidney failure [usually temporary] (1 in 500), bleeding (1 in 200), allergic reaction [possibly serious] (1 in 200)], benefits (diagnostic support and management of coronary artery disease) and alternatives of a cardiac catheterization were discussed in detail with Mr. Slovacek and he is willing to proceed.    Orthostatic Hypotension Hypertension Patient has a history of hypertension complicated by symptomatic orthostatic hypotension.  - He states he had a bad day yesterday with symptomatic low BP but this was an isolated event. Overall doing well.  - Unable to tolerate Florinef  due to lower extremity edema. He was previously on Midodrine  but stopped this on his own several months ago and has remained asymptomatic.  - Continue compression stockings (he was unable to tolerate the abdominal binder). Continue staying well hydrated and changing positions slowly.     Atypical Chest Pain  Patient continues to the upper chest tightness near shoulder when he is using exercise machine that requires a lot of arm movements. He has had this for a long time and this is stable. No other chest pain.  - He continues to have atypical chest pain but this has actually improved some as he has not been using the exercise machine as much. - Does not sound cardiac in nature. Sounds musculoskeletal. It is not bothersome to him. No additional ischemic work-up necessary for this at this time.    Bradycardia Paroxysmal SVT Monitor in 10/2022 showed predominant sinus  rhythm with average heart rate of 52 bpm with frequent SVT (most runs were 5-6 beats long but long run was 24 seconds) and isolated PACs/ PVCs but no high grade AV block or pauses.  - EKG today shows normal sinus rhythm with rates in the low 60s. - Continue to avoid AV nodal agents.    PAD S/p left femoral-tibial bypass in 2014. Recent lower extremity dopplers on 01/24/2023 showed patent bypass graft with mid graft velocity <40 cm/s with a biphasic waveform. ABIs were normal on the left (1.02) and mildly reduce on the right (0.86).  - Continue Plavix  75mg  daily. - Continue Crestor  10mg  daily. - Followed by Vascular Surgery.   Hyperlipidemia Lipid panel in 07/2022 (per [facility]): Total Cholesterol 131, Triglycerides 92, HLD 53, LDL 60. LDL goal <70 given PAD.  - Continue Crestor  10mg  daily.   CKD Stage IIIa Baseline creatinine around 1.2 to 1.3. Stable at 1.2 in 10/2022.  - Will recheck BMET today.  Disposition: Follow up in 2 weeks after cath.   Signed, Aline FORBES Door, PA-C  07/15/2023 5:38 PM    [facility]

## 2023-07-14 ENCOUNTER — Ambulatory Visit (INDEPENDENT_AMBULATORY_CARE_PROVIDER_SITE_OTHER): Payer: Medicare Other | Admitting: Physician Assistant

## 2023-07-14 ENCOUNTER — Ambulatory Visit (HOSPITAL_COMMUNITY)
Admission: RE | Admit: 2023-07-14 | Discharge: 2023-07-14 | Disposition: A | Discharge status: Home / Self Care | Payer: Medicare Other | Source: Ambulatory Visit | Attending: Surgery | Admitting: Surgery

## 2023-07-14 ENCOUNTER — Ambulatory Visit (HOSPITAL_BASED_OUTPATIENT_CLINIC_OR_DEPARTMENT_OTHER)
Admission: RE | Admit: 2023-07-14 | Discharge: 2023-07-14 | Disposition: A | Discharge status: Home / Self Care | Payer: Medicare Other | Source: Ambulatory Visit | Attending: Surgery | Admitting: Surgery

## 2023-07-14 VITALS — BP 140/81 | HR 63 | Temp 97.8°F | Ht 69.0 in | Wt 179.9 lb

## 2023-07-14 DIAGNOSIS — Z95828 Presence of other vascular implants and grafts: Secondary | ICD-10-CM | POA: Diagnosis not present

## 2023-07-14 DIAGNOSIS — I779 Disorder of arteries and arterioles, unspecified: Secondary | ICD-10-CM

## 2023-07-14 LAB — VAS US ABI WITH/WO TBI
Left ABI: 1.18
Right ABI: 0.88

## 2023-07-14 NOTE — Progress Notes (Signed)
 HISTORY AND PHYSICAL     CC:  follow up. Requesting Provider:  Serene Gaile ORN, MD  HPI: This is a 89 y.o. male who is here today for follow up for PAD.  Pt has remote history of left common femoral to above-knee popliteal artery bypass with vein on 11/27/2012 by Dr. Serene. This was done for critical limb ischemia with left toe ulceration.   Pt was last seen 01/27/2023 and at that time, he was not having any claudication, rest pain or tissue loss.  He was limited in his walking due to arthritis and was following with ortho for back and hip pain.  He did have decreased velocities on u/s that was present for past 2 studies. He had palpable pedal pulses and had biphasic flow.  He was scheduled for 6 month follow up.   The pt returns today for follow up.  He states that he is doing well.  He does not have any claudication or rest pain or non healing wounds.  He is limited by his hip and his back.  He does not smoke.  He is compliant with his statin and plavix .  He does have some left leg swelling that has been present since his bypass.  He does wear compression and this helps.   He stays active.  He visits his wife Tilton in the facility every day as she has dementia.     Pt is a Research scientist (life sciences).    The pt is on a statin for cholesterol management.    The pt is not on an aspirin .    Other AC:  Plavix  The pt is not on medication for hypertension.  The pt is not on diabetic medication. Tobacco hx:  former    Past Medical History:  Diagnosis Date   Anemia    pt denies this   Anxiety    Arthritis    legs and feet (06/10/2013)   Barrett's esophagus    never dilated (06/10/2013)   Cataract    Chronic kidney disease    stage 3   Colon polyps    tubular adenoma-last colon 12/18/2005   Depression    Diverticulosis    GERD (gastroesophageal reflux disease)    takes omeprazole    Hematoma    on mid back   Hiatal hernia    High cholesterol    Hypertension    Hypothyroidism    takes  synthroid    Peripheral vascular disease (HCC)     Past Surgical History:  Procedure Laterality Date   CARDIAC CATHETERIZATION     CATARACT EXTRACTION W/ INTRAOCULAR LENS  IMPLANT, BILATERAL Bilateral    COLONOSCOPY     EYE SURGERY     FEMORAL-POPLITEAL BYPASS GRAFT Left 11/27/2012   Procedure: BYPASS GRAFT LEFT ABOVE KNEE TO BELOW KNEE POPLITEAL ARTERY USING LEFT NONREVERSED GREATER SAPPHENOUS VEIN;  Surgeon: Gaile ORN Serene, MD;  Location: [facility] OR;  Service: Vascular;  Laterality: Left;   I & D EXTREMITY Left 06/10/2013   leg; from the OR in 11/2012   I & D EXTREMITY Left 06/10/2013   Procedure: IRRIGATION AND DEBRIDEMENT OF LEFT UPPER LEG;  Surgeon: Gaile ORN Serene, MD;  Location: [facility] OR;  Service: Vascular;  Laterality: Left;   LOWER EXTREMITY ANGIOGRAM N/A 10/01/2012   Procedure: LOWER EXTREMITY ANGIOGRAM;  Surgeon: Candyce GORMAN Reek, MD;  Location: [facility] CATH LAB;  Service: Cardiovascular;  Laterality: N/A;   POLYPECTOMY     stomach   PYLOROPLASTY     TONSILLECTOMY  TRANSURETHRAL RESECTION OF PROSTATE     UPPER GASTROINTESTINAL ENDOSCOPY      Allergies  Allergen Reactions   Penicillins Other (See Comments)    Boils    Current Outpatient Medications  Medication Sig Dispense Refill   acetaminophen  (TYLENOL ) 500 MG tablet Take 1,000 mg by mouth at bedtime.     alum & mag hydroxide-simeth (MAALOX/MYLANTA) 200-200-20 MG/5ML suspension Take 15 mLs by mouth daily.     clopidogrel  (PLAVIX ) 75 MG tablet Take 1 tablet (75 mg total) by mouth daily. 15 tablet 0   feeding supplement (BOOST HIGH PROTEIN) LIQD Take 1 Container by mouth 2 (two) times daily between meals.     levothyroxine  (SYNTHROID , LEVOTHROID) 125 MCG tablet Take 125 mcg by mouth at bedtime.     Omega 3-6-9 Fatty Acids (OMEGA 3-6-9 COMPLEX PO) Take 2 capsules by mouth daily.     omeprazole  (PRILOSEC) 40 MG capsule Take 1 capsule (40 mg total) by mouth daily. Office visit for further refills 100 capsule 2   rosuvastatin   (CRESTOR ) 10 MG tablet Take 10 mg by mouth daily.     Current Facility-Administered Medications  Medication Dose Route Frequency Provider Last Rate Last Admin   methylPREDNISolone  acetate (DEPO-MEDROL ) injection 40 mg  40 mg Other Once         Family History  Problem Relation Age of Onset   Heart disease Mother    Lung cancer Brother        and sister   Melanoma Father    Alzheimer's disease Sister        x 2   Heart disease Sister    Hypertension Sister    Colon cancer Neg Hx    Colon polyps Neg Hx    Rectal cancer Neg Hx    Pancreatic cancer Neg Hx    Stomach cancer Neg Hx    Esophageal cancer Neg Hx     Social History   Socioeconomic History   Marital status: Married    Spouse name: Not on file   Number of children: 4   Years of education: Not on file   Highest education level: Not on file  Occupational History   Occupation: Retired  Tobacco Use   Smoking status: Former    Current packs/day: 0.00    Average packs/day: 1 pack/day for 20.0 years (20.0 ttl pk-yrs)    Types: Cigarettes    Start date: 06/28/1965    Quit date: 06/28/1985    Years since quitting: 38.0   Smokeless tobacco: Never  Vaping Use   Vaping status: Never Used  Substance and Sexual Activity   Alcohol use: No    Comment: 06/10/2013 drank recreationally when I did drink; stopped ~ 2010   Drug use: No   Sexual activity: Never  Other Topics Concern   Not on file  Social History Narrative   Not on file   Social Drivers of Health   Financial Resource Strain: Not on file  Food Insecurity: No Food Insecurity (10/10/2022)   Hunger Vital Sign    Worried About Running Out of Food in the Last Year: Never true    Ran Out of Food in the Last Year: Never true  Transportation Needs: No Transportation Needs (10/10/2022)   PRAPARE - Administrator, Civil Service (Medical): No    Lack of Transportation (Non-Medical): No  Physical Activity: Not on file  Stress: Not on file  Social  Connections: Not on file  Intimate Partner Violence: Not At  Risk (10/10/2022)   Humiliation, Afraid, Rape, and Kick questionnaire    Fear of Current or Ex-Partner: No    Emotionally Abused: No    Physically Abused: No    Sexually Abused: No     REVIEW OF SYSTEMS:   [X]  denotes positive finding, [ ]  denotes negative finding Cardiac  Comments:  Chest pain or chest pressure:    Shortness of breath upon exertion:    Short of breath when lying flat:    Irregular heart rhythm:        Vascular    Pain in calf, thigh, or hip brought on by ambulation:    Pain in feet at night that wakes you up from your sleep:     Blood clot in your veins:    Leg swelling:         Pulmonary    Oxygen at home:    Productive cough:     Wheezing:         Neurologic    Sudden weakness in arms or legs:     Sudden numbness in arms or legs:     Sudden onset of difficulty speaking or slurred speech:    Temporary loss of vision in one eye:     Problems with dizziness:         Gastrointestinal    Blood in stool:     Vomited blood:         Genitourinary    Burning when urinating:     Blood in urine:        Psychiatric    Major depression:         Hematologic    Bleeding problems:    Problems with blood clotting too easily:        Skin    Rashes or ulcers:        Constitutional    Fever or chills:      PHYSICAL EXAMINATION:  Today's Vitals   07/14/23 1456 07/14/23 1500  BP: 131/77 (!) 140/81  Pulse: 63   Temp: 97.8 F (36.6 C)   TempSrc: Temporal   SpO2: 98%   Weight: 179 lb 14.4 oz (81.6 kg)   Height: 5' 9 (1.753 m)   PainSc: 0-No pain    Body mass index is 26.57 kg/m.   General:  WDWN in NAD; vital signs documented above Gait: Not observed HENT: WNL, normocephalic Pulmonary: normal non-labored breathing , without wheezing Cardiac: regular HR, without carotid bruits Abdomen: soft, NT; aortic pulse is not palpable Skin: without rashes Vascular Exam/Pulses: Bilateral  radial pulses are palpable Bilateral femoral pulses are faintly palpable Left DP pulse is easily palpable and right DP pulse is faintly palpable  Extremities: without ischemic changes, without Gangrene , without cellulitis; without open wounds; mild pitting edema left lower leg Musculoskeletal: no muscle wasting or atrophy  Neurologic: A&O X 3 Psychiatric:  The pt has Normal affect.   Non-Invasive Vascular Imaging:   ABI's/TBI's on 07/14/2023: Right:  0.88/0.62 - Great toe pressure: 85 Left:  1.18/0.82 - Great toe pressure: 113  Arterial duplex on 07/14/2023: +-----------+--------+-----+--------+---------+--------+  LEFT      PSV cm/sRatioStenosisWaveform Comments  +-----------+--------+-----+--------+---------+--------+  CFA Prox   88                   biphasic           +-----------+--------+-----+--------+---------+--------+  DFA       86  triphasic          +-----------+--------+-----+--------+---------+--------+  SFA Prox   80                   triphasic          +-----------+--------+-----+--------+---------+--------+  SFA Mid    69                   triphasic          +-----------+--------+-----+--------+---------+--------+  SFA Distal 93                   biphasic           +-----------+--------+-----+--------+---------+--------+  ATA Distal 38                   biphasic           +-----------+--------+-----+--------+---------+--------+  PTA Distal 28                   biphasic           +-----------+--------+-----+--------+---------+--------+  PERO Distal32                   biphasic           +-----------+--------+-----+--------+---------+--------+   Left Graft #1: AK to BK popliteal bypass  +--------------------+--------+--------+---------+--------+                     PSV cm/sStenosisWaveform Comments  +--------------------+--------+--------+---------+--------+  Inflow             53               triphasic          +--------------------+--------+--------+---------+--------+  Proximal Anastomosis111             triphasic          +--------------------+--------+--------+---------+--------+  Proximal Graft      40              biphasic           +--------------------+--------+--------+---------+--------+  Mid Graft           45              biphasic           +--------------------+--------+--------+---------+--------+  Distal Graft        45              biphasic           +--------------------+--------+--------+---------+--------+  Distal Anastomosis  51              biphasic           +--------------------+--------+--------+---------+--------+  Outflow            45              biphasic           +--------------------+--------+--------+---------+--------+   Previous ABI's/TBI's on 01/24/2023: Right:  0.86/0.61 - Great toe pressure: 115 Left:  1.02/0.91 - Great toe pressure:  172  Previous arterial duplex on 01/24/2023: +-----------+--------+-----+--------+--------+--------+  LEFT      PSV cm/sRatioStenosisWaveformComments  +-----------+--------+-----+--------+--------+--------+  CFA Distal 119                  biphasic          +-----------+--------+-----+--------+--------+--------+  DFA       67                   biphasic          +-----------+--------+-----+--------+--------+--------+  SFA Prox   78                   biphasic          +-----------+--------+-----+--------+--------+--------+  SFA Mid    88                   biphasic          +-----------+--------+-----+--------+--------+--------+  SFA Distal                              inflow    +-----------+--------+-----+--------+--------+--------+  ATA Distal 34                   biphasic          +-----------+--------+-----+--------+--------+--------+  PTA Distal 64                   biphasic           +-----------+--------+-----+--------+--------+--------+  PERO Distal31                   biphasic          +-----------+--------+-----+--------+--------+--------+   Left Graft #1: AK to BK pop  +--------------------+--------+--------+----------+--------+                     PSV cm/sStenosisWaveform  Comments  +--------------------+--------+--------+----------+--------+  Inflow             69              biphasic            +--------------------+--------+--------+----------+--------+  Proximal Anastomosis104             monophasic          +--------------------+--------+--------+----------+--------+  Proximal Graft      74              biphasic            +--------------------+--------+--------+----------+--------+  Mid Graft           37              biphasic            +--------------------+--------+--------+----------+--------+  Distal Graft        49              biphasic            +--------------------+--------+--------+----------+--------+  Distal Anastomosis  56              biphasic            +--------------------+--------+--------+----------+--------+  Outflow            49              biphasic            +--------------------+--------+--------+----------+--------+  Summary:  Left: Patent graft with no stenosis. Mid graft velocity <40 cm/s with a  biphasic waveform.     ASSESSMENT/PLAN:: 89 y.o. male here for follow up for PAD with hx of remote history of left common femoral to above-knee popliteal artery bypass with vein in November 2015 by Dr. Serene. This was done for critical limb ischemia with left toe ulceration.    -pt has palpable left DP pulse and normal toe pressure.  He does not have any non healing wounds or rest pain.   Duplex today reveals some decreased velocities in the graft.  The waveforms are biphasic.  He does have hx of CKD.  Discussed angiogram vs conservative management.  Will have him return  in 3 months with repeat duplex and see Dr. Serene and if velocities remain low, he can discuss with Dr. Serene.  He knows to call sooner if he develops any non healing wounds or rest pain.   -he will continue to wear his compression for LLE swelling that he has had since his bypass.  -continue statin/plavix     Lucie Apt, [facility] [phone]  Clinic MD:   Serene

## 2023-07-15 ENCOUNTER — Encounter: Payer: Self-pay | Admitting: Student

## 2023-07-15 ENCOUNTER — Ambulatory Visit: Attending: Student | Admitting: Student

## 2023-07-15 VITALS — BP 148/77 | HR 60 | Ht 69.0 in | Wt 181.4 lb

## 2023-07-15 DIAGNOSIS — I471 Supraventricular tachycardia, unspecified: Secondary | ICD-10-CM

## 2023-07-15 DIAGNOSIS — R001 Bradycardia, unspecified: Secondary | ICD-10-CM | POA: Diagnosis not present

## 2023-07-15 DIAGNOSIS — Z01810 Encounter for preprocedural cardiovascular examination: Secondary | ICD-10-CM | POA: Diagnosis not present

## 2023-07-15 DIAGNOSIS — E785 Hyperlipidemia, unspecified: Secondary | ICD-10-CM | POA: Diagnosis not present

## 2023-07-15 DIAGNOSIS — I10 Essential (primary) hypertension: Secondary | ICD-10-CM

## 2023-07-15 DIAGNOSIS — I951 Orthostatic hypotension: Secondary | ICD-10-CM | POA: Diagnosis not present

## 2023-07-15 DIAGNOSIS — N1831 Chronic kidney disease, stage 3a: Secondary | ICD-10-CM

## 2023-07-15 DIAGNOSIS — R0609 Other forms of dyspnea: Secondary | ICD-10-CM

## 2023-07-15 DIAGNOSIS — R0789 Other chest pain: Secondary | ICD-10-CM | POA: Diagnosis not present

## 2023-07-15 DIAGNOSIS — I739 Peripheral vascular disease, unspecified: Secondary | ICD-10-CM

## 2023-07-15 NOTE — Patient Instructions (Addendum)
 Medication Instructions:  The patient is currently on no regular medications.  *If you need a refill on your cardiac medications before your next appointment, please call your pharmacy*  Lab Work: BMET AND CBC TODAY  If you have labs (blood work) drawn today and your tests are completely normal, you will receive your results only by: MyChart Message (if you have MyChart) OR A paper copy in the mail If you have any lab test that is abnormal or we need to change your treatment, we will call you to review the results.  Testing/Procedures:  Your physician has requested that you have an echocardiogram. Echocardiography is a painless test that uses sound waves to create images of your heart. It provides your doctor with information about the size and shape of your heart and how well your heart's chambers and valves are working. This procedure takes approximately one hour. There are no restrictions for this procedure. Please do NOT wear cologne, perfume, aftershave, or lotions (deodorant is allowed). Please arrive 15 minutes prior to your appointment time.  Please note: We ask at that you not bring children with you during ultrasound (echo/ vascular) testing. Due to room size and safety concerns, children are not allowed in the ultrasound rooms during exams. Our front office staff cannot provide observation of children in our lobby area while testing is being conducted. An adult accompanying a patient to their appointment will only be allowed in the ultrasound room at the discretion of the ultrasound technician under special circumstances. We apologize for any inconvenience.   Follow-Up: At [facility], you and your health needs are our priority.  As part of our continuing mission to provide you with exceptional heart care, our providers are all part of one team.  This team includes your primary Cardiologist (physician) and Advanced Practice Providers or APPs (Physician Assistants and Nurse  Practitioners) who all work together to provide you with the care you need, when you need it.  Your next appointment:  [facility] 08/04/23 WITH CALLIE GOODRICH PA   We recommend signing up for the patient portal called MyChart.  Sign up information is provided on this After Visit Summary.  MyChart is used to connect with patients for Virtual Visits (Telemedicine).  Patients are able to view lab/test results, encounter notes, upcoming appointments, etc.  Non-urgent messages can be sent to your provider as well.   To learn more about what you can do with MyChart, go to [email or web address].   Other Instructions       Cardiac/Peripheral Catheterization   You are scheduled for a Cardiac Catheterization on Monday, July 14 with Dr. Lonni End.  1. Please arrive at the [facility] (Main Entrance A) at [facility]: [address] [place], [place] [address] at 8:30 AM (This time is 2 hour(s) before your procedure to ensure your preparation). Your procedure is scheduled to begin at 10:30.  Free valet parking service is available. You will check in at ADMITTING. The support person will be asked to wait in the waiting room.  It is OK to have someone drop you off and come back when you are ready to be discharged.        Special note: Every effort is made to have your procedure done on time. Please understand that emergencies sometimes delay scheduled procedures.  2. Diet: Do not eat solid foods after midnight.  You may have clear liquids until 5 AM the day of the procedure.  3. Labs: You will need to  have blood drawn on Tuesday, July 8 at [facility] (1st Floor), [address] [place], [place] [address]. You do not need to be fasting.  4. Medication instructions in preparation for your procedure:   Contrast Allergy: No  On the morning of your procedure, take Plavix /Clopidogrel  and any morning medicines NOT listed above.  You may use sips  of water.  5. Plan to go home the same day, you will only stay overnight if medically necessary. 6. You MUST have a responsible adult to drive you home. 7. An adult MUST be with you the first 24 hours after you arrive home. 8. Bring a current list of your medications, and the last time and date medication taken. 9. Bring ID and current insurance cards. 10.Please wear clothes that are easy to get on and off and wear slip-on shoes.  Thank you for allowing us  to care for you!   -- [facility]

## 2023-07-16 ENCOUNTER — Other Ambulatory Visit: Payer: Self-pay

## 2023-07-16 ENCOUNTER — Telehealth: Payer: Self-pay | Admitting: Internal Medicine

## 2023-07-16 ENCOUNTER — Ambulatory Visit: Payer: Self-pay | Admitting: Student

## 2023-07-16 DIAGNOSIS — R9431 Abnormal electrocardiogram [ECG] [EKG]: Secondary | ICD-10-CM

## 2023-07-16 DIAGNOSIS — E875 Hyperkalemia: Secondary | ICD-10-CM

## 2023-07-16 DIAGNOSIS — I779 Disorder of arteries and arterioles, unspecified: Secondary | ICD-10-CM

## 2023-07-16 DIAGNOSIS — R0609 Other forms of dyspnea: Secondary | ICD-10-CM

## 2023-07-16 LAB — CBC
Hematocrit: 41.3 % (ref 37.5–51.0)
Hemoglobin: 13.6 g/dL (ref 13.0–17.7)
MCH: 32.3 pg (ref 26.6–33.0)
MCHC: 32.9 g/dL (ref 31.5–35.7)
MCV: 98 fL — ABNORMAL HIGH (ref 79–97)
Platelets: 190 x10E3/uL (ref 150–450)
RBC: 4.21 x10E6/uL (ref 4.14–5.80)
RDW: 13.3 % (ref 11.6–15.4)
WBC: 11.6 x10E3/uL — ABNORMAL HIGH (ref 3.4–10.8)

## 2023-07-16 LAB — BASIC METABOLIC PANEL WITH GFR
BUN/Creatinine Ratio: 27 — AB (ref 10–24)
BUN: 37 mg/dL — AB (ref 8–27)
CO2: 22 mmol/L (ref 20–29)
Calcium: 9.5 mg/dL (ref 8.6–10.2)
Chloride: 98 mmol/L (ref 96–106)
Creatinine, Ser: 1.36 mg/dL — AB (ref 0.76–1.27)
Glucose: 82 mg/dL (ref 70–99)
Potassium: 5.3 mmol/L — AB (ref 3.5–5.2)
Sodium: 135 mmol/L (ref 134–144)
eGFR: 50 mL/min/1.73 — AB (ref >59)

## 2023-07-16 NOTE — Telephone Encounter (Signed)
 Received notification that insurance has denied cardiac catheterization given no coronary CTA or stress test. Called and spoke with patient offered to do a peer to peer to see if we could get insurance to cover procedure, but he would like to just try the coronary CTA first. Therefore, will cancel cardiac catheterization for now and order coronary CTA. Heart rate was 60 bpm in the office so will not order a beta-blocker.   Reviewed labs with patient.  WBC mildly elevated but no infectious symptoms. Creatinine slightly higher than it was in 10/2022 but stable at 1.36. Potassium was slightly high at 5.3. Suspect this is a lab error. He is not on any medications that cause hyperkalemia and it does not sound like he eats a lot of foods high in potassium. Patient will come in for repeat BMET on Monday 07/21/2023. Will place order.  Imogean Ciampa E Bunyan Brier, PA-C 07/16/2023 12:57 PM

## 2023-07-16 NOTE — Telephone Encounter (Signed)
 Pt states that he cannot afford to have Cath done and needs to cx, requesting cb

## 2023-07-16 NOTE — Telephone Encounter (Signed)
 Returned patient's phone call regarding Heart Cath that is scheduled for 07/21/23. He states that he called his insurance company and his deductible is $8,900 and he must pay this before they begin to cover any portion. He states he can not afford this and also is wondering if there is another test that the Provider can order instead of the Heart Cath.

## 2023-07-21 ENCOUNTER — Encounter (HOSPITAL_COMMUNITY): Admission: RE | Payer: Self-pay | Source: Home / Self Care

## 2023-07-21 ENCOUNTER — Ambulatory Visit (HOSPITAL_COMMUNITY): Admission: RE | Admit: 2023-07-21 | Source: Home / Self Care | Admitting: Internal Medicine

## 2023-07-21 SURGERY — RIGHT/LEFT HEART CATH AND CORONARY ANGIOGRAPHY
Anesthesia: LOCAL

## 2023-07-23 NOTE — Progress Notes (Signed)
 Cardiology Office Note:    Date:  08/04/2023   ID:  [name], DOB [birth year], MRN [id number]  PCP:  Walter Cole, MD  Cardiologist:  Stanly DELENA Leavens, MD     Referring MD: Walter Cole, MD   Chief Complaint: follow-up of dyspnea on exertion and coronary CTA  History of Present Illness:    Walter [name] is a 89 y.o. male with a history of of mild non-obstructive CAD on cardiac catheterization in 2010, paroxysmal SVT,  bradycardia, PAD s/p left femoral-tibial bypass in 2014, hypertension complicated by orthostatic hypotension, hyperlipidemia, hypothyroidism, GERD with Barrett's esophagus, CKD stage IIIa, and anxiety/ depression who is followed by Dr. Leavens and presents today for follow-up of dyspnea on exertion and coronary CTA.  Remote cardiac catheterization in 2010 showed 30% stenosis of proximal LAD and otherwise clean coronaries. He was previously seen by Dr. Dann in 03/2014 for PAD. Peripheral angiogram in 2014 showed severe right distal SFA disease and an occluded left popliteal artery as well as bilateral tibial disease. He underwent a left femoral-tibial bypass in 11/2012. His PAD is now followed by Vascular Surgery.    He was seen by Dr. Leavens in 10/2022 after not being seen since 2016 for further evaluation of bradycardia after a recent admission for near syncope earlier that month. He was noted to be orthostatic at that time. However, he was also noted to be bradycardic so there was concern for symptomatic bradycardia. Echo showed LVEF of 55-60% with normal wall motion and grade 1 diastolic dysfunction, normal RV size and function, and mild MR. Outpatient Zio monitor was ordered and showed predominant sinus rhythm with average heart rate of 52 bpm with frequent SVT (most runs were 5-6 beats long but long run was 24 seconds) and isolated PACs/ PVCs but no high grade AV block or pauses. At the visit with Dr. Leavens, he reported persistent dizziness only  when standing. He also reported a recent episode of chest pain that could be angina (that was different the the usual pain he has when using the exercise machine). He was started on Fludrocortisone  for orthostatic hypotension. Plan was to get an exercise treadmill test to evaluate for chronotropic incompetence once bike testing is available. Plan was also to get a coronary CTA if he has recurrent chest pain. Fludrocortisone  was subsequently stopped and he was started on Midodrine  instead. He subsequently stopped Midodrine  on his own with significant improvement in his symptoms.   He was last seen by me on 07/15/2023 at  which time he reported significant new dyspnea on exertion over the last few months but was doing well from an orthostatic hypotension standpoint. EKG showed new T wave inversions. There was concern that dyspnea was an anginal equivalent. R/ LHC was arranged and Echo was ordered. However, insurance denied the R/ LHC so coronary CTA was ordered instead.  Patient presents today for follow-up. He had the coronary CTA done on 07/28/2023 but it has not been formally read yet due to a delay in submitting to heartflow for plaque analysis. However, he is doing much better today and is no longer having any significant dyspnea on exertion. Wonder whether he had some viral illness. No orthopnea or PND. He is has chronic left lower extremity edema but this is stable. He denies any chest pain. He is not even having the atypical chest pain that he used to describe with use of the upper body exercise machine. No palpitations, lightheadedness/ dizziness, or near syncope/ syncope.  He did have a fall yesterday after tripping over his cat. He landed on his bottom and left shoulder. He did not hit is head or have any LOC. He is a little sore from this but overall feeling okay.    EKGs/Labs/Other Studies Reviewed:    The following studies were reviewed today:  Echocardiogram 10/11/2022: Impressions: 1. Left  ventricular ejection fraction, by estimation, is 55 to 60%. The  left ventricle has normal function. The left ventricle has no regional  wall motion abnormalities. Left ventricular diastolic parameters are  consistent with Grade I diastolic  dysfunction (impaired relaxation).   2. Right ventricular systolic function is normal. The right ventricular  size is normal. There is normal pulmonary artery systolic pressure.   3. Left atrial size was mildly dilated.   4. The mitral valve is normal in structure. Mild mitral valve  regurgitation. No evidence of mitral stenosis.   5. The aortic valve is normal in structure. Aortic valve regurgitation is  not visualized. No aortic stenosis is present.   6. The inferior vena cava is normal in size with greater than 50%  respiratory variability, suggesting right atrial pressure of 3 mmHg.  _______________   Monitor 10/17/2022 to 10/31/2022:   Patient had a minimum heart rate of 39 bpm, maximum heart rate of 175 bpm, and average heart rate of 52 bpm.   Predominant underlying rhythm was sinus rhythm.   Frequent SVT, most 5-6 beats but longest 24 seconds   Isolated PACs were rare (<1.0%).   Isolated PVCs were rare (<1.0%).   No high grade AV block.   Triggered and diary events associated with sinus rhythm.   Asymptomatic SVT. _______________  Lower Extremity Dopplers/ ABIs 07/14/2023: Summary: Left: Patent graft with no visualized stenosis. Proximal to distal graft  velocity < 40 cm/s with biphasic waveforms.   ABI Summary: Right: Resting right ankle-brachial index indicates mild right lower  extremity arterial disease. The right toe-brachial index is abnormal.   Left: Resting left ankle-brachial index is within normal range. The left  toe-brachial index is normal.  _______________  EKG:  EKG not ordered today.  Recent Labs: 10/10/2022: Magnesium 2.1; TSH 0.731 10/11/2022: ALT 17 07/15/2023: BUN 37; Creatinine, Ser 1.36; Hemoglobin 13.6;  Platelets 190; Potassium 5.3; Sodium 135  Recent Lipid Panel    Component Value Date/Time   CHOL (H) 03/15/2008 0445    207        ATP III CLASSIFICATION:  <200     mg/dL   Desirable  799-760  mg/dL   Borderline High  >=759    mg/dL   High          TRIG 835 (H) 03/15/2008 0445   HDL 31 (L) 03/15/2008 0445   CHOLHDL 6.7 03/15/2008 0445   VLDL 33 03/15/2008 0445   LDLCALC (H) 03/15/2008 0445    143        Total Cholesterol/HDL:CHD Risk Coronary Heart Disease Risk Table                     Men   Women  1/2 Average Risk   3.4   3.3  Average Risk       5.0   4.4  2 X Average Risk   9.6   7.1  3 X Average Risk  23.4   11.0        Use the calculated Patient Ratio above and the CHD Risk Table to determine the patient's CHD Risk.  ATP III CLASSIFICATION (LDL):  <100     mg/dL   Optimal  899-870  mg/dL   Near or Above                    Optimal  130-159  mg/dL   Borderline  839-810  mg/dL   High  >809     mg/dL   Very High    Physical Exam:    Vital Signs: BP (!) 170/60 (BP Location: Left Arm, Patient Position: Sitting, Cuff Size: Normal)   Pulse (!) 57   Ht 5' 9 (1.753 m)   Wt 182 lb (82.6 kg)   BMI 26.88 kg/m     Wt Readings from Last 3 Encounters:  08/04/23 182 lb (82.6 kg)  07/15/23 181 lb 6.4 oz (82.3 kg)  07/14/23 179 lb 14.4 oz (81.6 kg)     General: 89 y.o. Caucasian male in no acute distress. HEENT: Normocephalic and atraumatic. Sclera clear.  Neck: Supple. No JVD. Heart: RRR. Distinct S1 and S2. No murmurs, gallops, or rubs.  Lungs: No increased work of breathing. Clear to ausculation bilaterally. No wheezes, rhonchi, or rales.  Extremities: Trace pitting edema of bilateral lower extremities (left chronically larger than right).  Skin: Warm and dry. Neuro: No focal deficits. Psych: Normal affect. Responds appropriately.   Assessment:    1. Dyspnea on exertion   2. Non-obstructive CAD   3. History of atypical chest pain   4. Bradycardia   5.  Paroxysmal SVT (supraventricular tachycardia) (HCC)   6. PAD (peripheral artery disease) (HCC)   7. Orthostatic hypotension   8. Primary hypertension   9. Hyperlipidemia, unspecified hyperlipidemia type   10. Stage 3a chronic kidney disease (HCC)   11. Hyperkalemia     Plan:    Dyspnea on Exertion Patient reported new significant dyspnea on exertion at last visit earlier this month and EKG showed new T wave inversions. Coronary CTA was completed on 07/28/2023 but waiting on final read (there was a delay in the submission to heartflow for plaque analysis). Echo was also ordered and is scheduled for 08/20/2023.  - He reports significant improvement in dyspnea. It has essentially resolved. - Euvolemic on exam. No other symptoms suggestive of CHF.  - Echo is scheduled for next month as above.  - Question whether he could of had a viral illness earlier this month causing his symptoms although this would not explain his T wave inversions. Will wait for results of coronary CTA and Echo.  Mild Non-Obstructive CAD Noted on cardiac catheterization in 2010. - No chest pain. Dyspnea has essentially resolved.  - Continue Plavix  and Crestor  for which he takes for PAD. - Coronary CTA which was ordered for further evaluation of dyspnea on exertion and new T waves inversions is pending as above.   History of Atypical Chest Pain  Patient has a long history of upper chest tightness near shoulder when he is using exercise machine that requires a lot of arm movements. Previously been felt to be musculoskeletal in nature. - He states this has resolved.   Bradycardia Paroxysmal SVT Monitor in 10/2022 showed predominant sinus rhythm with average heart rate of 52 bpm with frequent SVT (most runs were 5-6 beats long but long run was 24 seconds) and isolated PACs/ PVCs but no high grade AV block or pauses.  - Stable. No significant palpitations. Rate in the high 50s today.  - Continue to avoid AV nodal agents.     PAD S/p  left femoral-tibial bypass in 2014. Recent lower extremity dopplers on 07/16/2023 showed patent bypass graft with proximal to distal graft velocity <40 cm/s with a biphasic waveform. ABIs were normal on the left (1.18) and mildly reduce on the right (0.88).  - Continue Plavix  75mg  daily. - Continue Crestor  10mg  daily. - Followed by Vascular Surgery.  Orthostatic Hypotension Hypertension Patient has a history of hypertension complicated by symptomatic orthostatic hypotension.  - BP initially 170/60 in the office today but then improved to 150/62 on my personal recheck at the end of visit.  - No symptoms of orthostatic hypotension. - Unable to tolerate Florinef  due to lower extremity edema. He was previously on Midodrine  but stopped this on his own several months ago and has remained asymptomatic.  - Continue compression stockings (he was unable to tolerate the abdominal binder). Continue staying well hydrated and changing positions slowly.     Hyperlipidemia Lipid panel in 07/2022 (per [facility]): Total Cholesterol 131, Triglycerides 92, HLD 53, LDL 60. LDL goal <70 given PAD.  - Continue Crestor  10mg  daily.   CKD Stage IIIa Baseline creatinine around 1.2 to 1.3. Stable at 1.36 on 07/14/2023.  Hyperkalemia Potassium 5.3 on most recent labs on 07/14/2023. Suspect this is a labs error. He is not on any medications that cause hyperkalemia.  - Will repeat BMET today.  Disposition: Follow up in 4 months unless coronary CTA is abnormal.   Signed, Aline FORBES Door, PA-C  08/04/2023 3:12 PM    [facility]  ADDENDUM 08/05/2023: Coronary CTA showed a coronary calcium  score of 586 (51st percentile for age and sex) and moderate stenosis) 50-69% of ostial to mid LAD. FFR demonstrated a possibly hemodynamically flow limiting lesion in the mid LAD (FFR 0.94 >> 0.79 >> 0.71). Cardiac catheterization was recommended. Personally called and discussed this with patient. He would like to discuss  proceeding with cardiac catheterization with his son. He is planning to call us  back on Friday after he talks with his son. I will make myself a reminder to follow back up with him on Monday 08/11/2023 if we have not heard from him by then.  I did go ahead and review the risk and benefits of the procedure with him. He is already on Plavix  and Crestor . Provided prescription of sublingual Nitroglycerin . Educated him on how to take this. Reviewed ED precautions.   He states he never stopped to get the BMET draw yesterday because he was having pain in his neck from the mechanical fall he had the day before. Asked him to come back by and get BMET drawn sometime this week. Recommended OTC Tylenol  to help with his musculoskeletal pain (he took this last night and states it helped).   Kimiah Hibner E Chirstopher Iovino, PA-C 08/05/2023 9:14 AM

## 2023-07-24 ENCOUNTER — Encounter (HOSPITAL_COMMUNITY): Payer: Self-pay

## 2023-07-25 ENCOUNTER — Telehealth (HOSPITAL_COMMUNITY): Payer: Self-pay | Admitting: *Deleted

## 2023-07-25 NOTE — Telephone Encounter (Signed)
 Attempted to call patient regarding upcoming cardiac CT appointment. Left message on voicemail with name and callback number Johney Frame RN Navigator Cardiac Imaging [facility] [phone] Office

## 2023-07-28 ENCOUNTER — Ambulatory Visit (HOSPITAL_COMMUNITY)
Admission: RE | Admit: 2023-07-28 | Discharge: 2023-07-28 | Disposition: A | Discharge status: Home / Self Care | Source: Ambulatory Visit | Attending: Student | Admitting: Student

## 2023-07-28 DIAGNOSIS — I2510 Atherosclerotic heart disease of native coronary artery without angina pectoris: Secondary | ICD-10-CM | POA: Diagnosis not present

## 2023-07-28 DIAGNOSIS — R9431 Abnormal electrocardiogram [ECG] [EKG]: Secondary | ICD-10-CM | POA: Diagnosis present

## 2023-07-28 DIAGNOSIS — K449 Diaphragmatic hernia without obstruction or gangrene: Secondary | ICD-10-CM | POA: Insufficient documentation

## 2023-07-28 DIAGNOSIS — R0609 Other forms of dyspnea: Secondary | ICD-10-CM | POA: Diagnosis not present

## 2023-07-28 MED ORDER — IOHEXOL 350 MG/ML SOLN
100.0000 mL | Freq: Once | INTRAVENOUS | Status: AC | PRN
  Administered 2023-07-28: 100 mL via INTRAVENOUS

## 2023-07-28 MED ORDER — NITROGLYCERIN 0.4 MG SL SUBL
0.8000 mg | SUBLINGUAL_TABLET | Freq: Once | SUBLINGUAL | Status: AC
  Administered 2023-07-28: 0.8 mg via SUBLINGUAL

## 2023-08-04 ENCOUNTER — Encounter: Payer: Self-pay | Admitting: Student

## 2023-08-04 ENCOUNTER — Ambulatory Visit: Attending: Student | Admitting: Student

## 2023-08-04 ENCOUNTER — Ambulatory Visit (HOSPITAL_COMMUNITY)
Admission: RE | Admit: 2023-08-04 | Discharge: 2023-08-04 | Disposition: A | Discharge status: Home / Self Care | Source: Ambulatory Visit | Attending: Cardiology | Admitting: Cardiology

## 2023-08-04 ENCOUNTER — Other Ambulatory Visit: Payer: Self-pay | Admitting: Cardiology

## 2023-08-04 VITALS — BP 170/60 | HR 57 | Ht 69.0 in | Wt 182.0 lb

## 2023-08-04 DIAGNOSIS — N1831 Chronic kidney disease, stage 3a: Secondary | ICD-10-CM | POA: Diagnosis not present

## 2023-08-04 DIAGNOSIS — E785 Hyperlipidemia, unspecified: Secondary | ICD-10-CM | POA: Diagnosis not present

## 2023-08-04 DIAGNOSIS — I2510 Atherosclerotic heart disease of native coronary artery without angina pectoris: Secondary | ICD-10-CM | POA: Diagnosis not present

## 2023-08-04 DIAGNOSIS — I739 Peripheral vascular disease, unspecified: Secondary | ICD-10-CM

## 2023-08-04 DIAGNOSIS — E875 Hyperkalemia: Secondary | ICD-10-CM | POA: Diagnosis not present

## 2023-08-04 DIAGNOSIS — R0609 Other forms of dyspnea: Secondary | ICD-10-CM | POA: Diagnosis not present

## 2023-08-04 DIAGNOSIS — R001 Bradycardia, unspecified: Secondary | ICD-10-CM

## 2023-08-04 DIAGNOSIS — R0789 Other chest pain: Secondary | ICD-10-CM | POA: Diagnosis not present

## 2023-08-04 DIAGNOSIS — I10 Essential (primary) hypertension: Secondary | ICD-10-CM

## 2023-08-04 DIAGNOSIS — I951 Orthostatic hypotension: Secondary | ICD-10-CM | POA: Diagnosis not present

## 2023-08-04 DIAGNOSIS — R931 Abnormal findings on diagnostic imaging of heart and coronary circulation: Secondary | ICD-10-CM

## 2023-08-04 DIAGNOSIS — I471 Supraventricular tachycardia, unspecified: Secondary | ICD-10-CM | POA: Diagnosis not present

## 2023-08-04 NOTE — Patient Instructions (Signed)
 Medication Instructions:  No medication changes were made during today's visit.  *If you need a refill on your cardiac medications before your next appointment, please call your pharmacy*   Lab Work: Labs will be drawn today.................. BMET If you have labs (blood work) drawn today and your tests are completely normal, you will receive your results only by: MyChart Message (if you have MyChart) OR A paper copy in the mail If you have any lab test that is abnormal or we need to change your treatment, we will call you to review the results.   Testing/Procedures: No procedures were ordered during today's visit.    Follow-Up: At [facility], you and your health needs are our priority.  As part of our continuing mission to provide you with exceptional heart care, we have created designated Provider Care Teams.  These Care Teams include your primary Cardiologist (physician) and Advanced Practice Providers (APPs -  Physician Assistants and Nurse Practitioners) who all work together to provide you with the care you need, when you need it.  We recommend signing up for the patient portal called MyChart.  Sign up information is provided on this After Visit Summary.  MyChart is used to connect with patients for Virtual Visits (Telemedicine).  Patients are able to view lab/test results, encounter notes, upcoming appointments, etc.  Non-urgent messages can be sent to your provider as well.   To learn more about what you can do with MyChart, go to [email or web address].    Your next appointment:   4 month(s)  Provider:   Callie Goodrich, PA-C  OR Stanly DELENA Leavens, MD will plan to see you again in 4 month(s).  Other Instructions Thank you for choosing [facility]!

## 2023-08-05 ENCOUNTER — Ambulatory Visit: Payer: Self-pay | Admitting: Student

## 2023-08-05 DIAGNOSIS — Z01812 Encounter for preprocedural laboratory examination: Secondary | ICD-10-CM

## 2023-08-05 MED ORDER — NITROGLYCERIN 0.4 MG SL SUBL: 0.4000 mg | SUBLINGUAL_TABLET | SUBLINGUAL | 2 refills | Status: AC | PRN

## 2023-08-07 DIAGNOSIS — N1831 Chronic kidney disease, stage 3a: Secondary | ICD-10-CM | POA: Diagnosis not present

## 2023-08-07 DIAGNOSIS — E782 Mixed hyperlipidemia: Secondary | ICD-10-CM | POA: Diagnosis not present

## 2023-08-07 DIAGNOSIS — E039 Hypothyroidism, unspecified: Secondary | ICD-10-CM | POA: Diagnosis not present

## 2023-08-07 DIAGNOSIS — M150 Primary generalized (osteo)arthritis: Secondary | ICD-10-CM | POA: Diagnosis not present

## 2023-08-08 ENCOUNTER — Other Ambulatory Visit (HOSPITAL_COMMUNITY): Payer: Self-pay

## 2023-08-11 DIAGNOSIS — E559 Vitamin D deficiency, unspecified: Secondary | ICD-10-CM | POA: Diagnosis not present

## 2023-08-11 DIAGNOSIS — N1831 Chronic kidney disease, stage 3a: Secondary | ICD-10-CM | POA: Diagnosis not present

## 2023-08-11 DIAGNOSIS — R7301 Impaired fasting glucose: Secondary | ICD-10-CM | POA: Diagnosis not present

## 2023-08-11 DIAGNOSIS — E039 Hypothyroidism, unspecified: Secondary | ICD-10-CM | POA: Diagnosis not present

## 2023-08-11 DIAGNOSIS — E782 Mixed hyperlipidemia: Secondary | ICD-10-CM | POA: Diagnosis not present

## 2023-08-11 DIAGNOSIS — I10 Essential (primary) hypertension: Secondary | ICD-10-CM | POA: Diagnosis not present

## 2023-08-12 LAB — LAB REPORT - SCANNED
A1c: 5.5
Creatinine, POC: 57 mg/dL
EGFR: 54
Microalb Creat Ratio: 43.1
Microalbumin, Urine: 2.46

## 2023-08-13 NOTE — Telephone Encounter (Signed)
 I called patient today to check back in on his decision of cath. He spoke with his son and is now agreeable to doing this but states he cannot do this until 08/29/2023 due to son's schedule. However, he states he would still have to spend the night after the procedure because his son would not be able to stay the night with him. His son would be able to take him home on 08/30/2023. I will talk with cath lab staff and see what I can arrange.   Patient also has an Echo scheduled for 08/20/2023 but he states he will need to reschedule this because he has another appointment that day that he cannot miss. Will send message to scheduling pool and ask them to schedule.  Patient was supposed to come in for a repeat BMET to recheck potassium level. He states he had labs draw at his PCP's office on Monday. I cannot see these labs. Asked him to have PCP fax them to us .  Harbert Fitterer E Cella Cappello, PA-C 08/13/2023 5:12 PM

## 2023-08-14 NOTE — Telephone Encounter (Signed)
 Pt changed his mind. Will keep echo 8/13

## 2023-08-15 DIAGNOSIS — E039 Hypothyroidism, unspecified: Secondary | ICD-10-CM | POA: Diagnosis not present

## 2023-08-15 DIAGNOSIS — M25551 Pain in right hip: Secondary | ICD-10-CM | POA: Diagnosis not present

## 2023-08-15 DIAGNOSIS — I10 Essential (primary) hypertension: Secondary | ICD-10-CM | POA: Diagnosis not present

## 2023-08-15 DIAGNOSIS — E559 Vitamin D deficiency, unspecified: Secondary | ICD-10-CM | POA: Diagnosis not present

## 2023-08-15 DIAGNOSIS — I739 Peripheral vascular disease, unspecified: Secondary | ICD-10-CM | POA: Diagnosis not present

## 2023-08-15 DIAGNOSIS — Z1211 Encounter for screening for malignant neoplasm of colon: Secondary | ICD-10-CM | POA: Diagnosis not present

## 2023-08-15 DIAGNOSIS — K219 Gastro-esophageal reflux disease without esophagitis: Secondary | ICD-10-CM | POA: Diagnosis not present

## 2023-08-15 DIAGNOSIS — N1831 Chronic kidney disease, stage 3a: Secondary | ICD-10-CM | POA: Diagnosis not present

## 2023-08-15 DIAGNOSIS — E782 Mixed hyperlipidemia: Secondary | ICD-10-CM | POA: Diagnosis not present

## 2023-08-15 DIAGNOSIS — Z0000 Encounter for general adult medical examination without abnormal findings: Secondary | ICD-10-CM | POA: Diagnosis not present

## 2023-08-15 NOTE — Telephone Encounter (Signed)
 Spoke with cath lab staff. We are currently not able to add on any more outpatient caths for 08/29/2023. Called and updated patient. He is going to reach out to friends/ neighbors over the weekend to see if there is anyone else who can drive him home from the hospital but he will have to spend the night after the procedure. Asked patient to call us  back early next week with an update so that we can schedule his cath [facility]).  Bhavana Kady E Jacole Capley, PA-C 08/15/2023 8:35 AM

## 2023-08-20 ENCOUNTER — Ambulatory Visit (HOSPITAL_COMMUNITY)
Admission: RE | Admit: 2023-08-20 | Discharge: 2023-08-20 | Disposition: A | Discharge status: Home / Self Care | Source: Ambulatory Visit | Attending: Student | Admitting: Student

## 2023-08-20 ENCOUNTER — Encounter: Payer: Self-pay | Admitting: *Deleted

## 2023-08-20 DIAGNOSIS — R0609 Other forms of dyspnea: Secondary | ICD-10-CM | POA: Diagnosis not present

## 2023-08-20 LAB — ECHOCARDIOGRAM COMPLETE
Area-P 1/2: 2.87 cm2
S' Lateral: 3.3 cm

## 2023-08-20 MED ORDER — PERFLUTREN LIPID MICROSPHERE
1.0000 mL | INTRAVENOUS | Status: AC | PRN
  Administered 2023-08-20 (×2): 2 mL via INTRAVENOUS

## 2023-08-20 NOTE — Telephone Encounter (Signed)
 Patient arrived on 5th floor to Resource Nurse after echo.  Arranged right and left heart cath with Dr. Mady on 09/04/23 at 9:00 am at [facility].  APP will do telephone visit prior to the cath date in order to stay in date w updating H&P.  Labs from 08/11/23 in Care Everywhere.  EKG will be done at the hospital morning of cath.  All instructions provided.

## 2023-08-20 NOTE — Telephone Encounter (Signed)
 Triage, do you mind calling this patient to check in. I have been trying to set him up for an outpatient right/ left cardiac catheterization after recent abnormal coronary but there have been issues with him not having anyone to drive him home or spend the night with him. I previously spoke with Trish in the cath lab and we can keep in overnight in the hospital if we have to. When I talked to the patient last week, he wanted to talk with his son and neighbors/ friends to see what he could figure out. Can you just follow-up on this? If he does not have the cath before 09/04/2023, he is going to need another office visit because he has to be seen within 1 month of the procedure.  Thanks so much! Mikias Lanz

## 2023-08-20 NOTE — Addendum Note (Signed)
 Addended by: Nayib Remer on: 08/20/2023 04:31 PM   Modules accepted: Orders

## 2023-08-25 ENCOUNTER — Ambulatory Visit: Payer: Self-pay | Admitting: Student

## 2023-08-25 NOTE — Progress Notes (Signed)
 Virtual Visit via Telephone Note   Because of Walter [name] co-morbid illnesses, he is at least at moderate risk for complications without adequate follow up.  This format is felt to be most appropriate for this patient at this time.  The patient did not have access to video technology/had technical difficulties with video requiring transitioning to audio format only (telephone).  All issues noted in this document were discussed and addressed.  No physical exam could be performed with this format.  Please refer to the patient's chart for his consent to telehealth for [facility]. Virtual platform was offered given ongoing worsening Covid-19 pandemic.  Date:  09/03/2023   ID:  Walter [name], DOB [birth year], MRN [id number]  Patient Location: Home Provider Location: Office  PCP:  Leonel Cole, MD  Cardiologist:  Stanly DELENA Leavens, MD  Electrophysiologist:  None   Evaluation Performed:  Follow-Up Visit  Chief Complaint:  follow-up of abnormal coronary CTA  History of Present Illness:    [name] is a 89 y.o. male with a history of mild non-obstructive CAD on cardiac catheterization in 2010, paroxysmal SVT,  bradycardia, PAD s/p left femoral-tibial bypass in 2014, hypertension complicated by orthostatic hypotension, hyperlipidemia, hypothyroidism, GERD with Barrett's esophagus, CKD stage IIIa, and anxiety/ depression who is followed by Dr. Leavens and presents for follow-up of abnormal coronary CTA and to discuss cardiac catheterization.   Remote cardiac catheterization in 2010 showed 30% stenosis of proximal LAD and otherwise clean coronaries. He was previously seen by Dr. Dann in 03/2014 for PAD. Peripheral angiogram in 2014 showed severe right distal SFA disease and an occluded left popliteal artery as well as bilateral tibial disease. He underwent a left femoral-tibial bypass in 11/2012. His PAD is now followed by Vascular Surgery.   He was seen by Dr.  Leavens in 10/2022 after not being seen since 2016 for further evaluation of bradycardia after a recent admission for near syncope earlier that month. He was noted to be orthostatic at that time. However, he was also noted to be bradycardic so there was concern for symptomatic bradycardia. Echo showed LVEF of 55-60% with normal wall motion and grade 1 diastolic dysfunction, normal RV size and function, and mild MR. Outpatient Zio monitor was ordered and showed predominant sinus rhythm with average heart rate of 52 bpm with frequent SVT (most runs were 5-6 beats long but long run was 24 seconds) and isolated PACs/ PVCs but no high grade AV block or pauses. At the visit with Dr. Leavens, he reported persistent dizziness only when standing. He also reported a recent episode of chest pain that could be angina (that was different the the usual pain he has when using the exercise machine). He was started on Fludrocortisone  for orthostatic hypotension. Plan was to get an exercise treadmill test to evaluate for chronotropic incompetence once bike testing is available. Plan was also to get a coronary CTA if he has recurrent chest pain. Fludrocortisone  was subsequently stopped and he was started on Midodrine  instead. He subsequently stopped Midodrine  on his own with significant improvement in his symptoms.   He was seen by me on 07/15/2023 and reported new significant  dyspnea on exertion over the last few months but was doing well from an orthostatic hypotension standpoint. EKG showed new T wave inversions. There was concern that dyspnea was an anginal equivalent. R/ LHC was arranged and Echo was ordered. However, insurance denied the R/ LHC so coronary CTA was ordered instead. He was  seen by me again on 08/04/2023. The coronary CTA had been done but results were still pending at time of visit due to a delay in submitting heartflow for plaque analysis. However, at that visit he reported he was no longer having  significant dyspnea on exertion. He was not having any chest pain either. Coronary CTA result came back and showed a coronary calcium  score of  586 (51st percentile for age and sex) and moderate stenosis) 50-69% of ostial to mid LAD. FFR demonstrated a possibly hemodynamically flow limiting lesion in the mid LAD (FFR 0.94 >> 0.79 >> 0.71). Cardiac catheterization was recommended. There has been delay in arranging this due to patient having difficult arranging someone to take him home.   Cardiac catheterization is scheduled for tomorrow so he presents today for a telephone visit for updated H&P for procedure.  He does report dyspnea on exertion again today but no shortness of breath at rest.  No orthopnea or PND.  He has chronic lower extremity edema mainly in his left leg but this is stable.  No chest pain, palpitations, lightheadedness/dizziness, syncope.  He has no questions about planned right/left cardiac catheterization tomorrow.  Past Medical History:  Diagnosis Date   Anemia    pt denies this   Anxiety    Arthritis    legs and feet (06/10/2013)   Barrett's esophagus    never dilated (06/10/2013)   Cataract    Chronic kidney disease    stage 3   Colon polyps    tubular adenoma-last colon 12/18/2005   Depression    Diverticulosis    GERD (gastroesophageal reflux disease)    takes omeprazole    Hematoma    on mid back   Hiatal hernia    High cholesterol    Hypertension    Hypothyroidism    takes synthroid    Peripheral vascular disease (HCC)    Past Surgical History:  Procedure Laterality Date   CARDIAC CATHETERIZATION     CATARACT EXTRACTION W/ INTRAOCULAR LENS  IMPLANT, BILATERAL Bilateral    COLONOSCOPY     EYE SURGERY     FEMORAL-POPLITEAL BYPASS GRAFT Left 11/27/2012   Procedure: BYPASS GRAFT LEFT ABOVE KNEE TO BELOW KNEE POPLITEAL ARTERY USING LEFT NONREVERSED GREATER SAPPHENOUS VEIN;  Surgeon: Gaile LELON New, MD;  Location: [facility] OR;  Service: Vascular;  Laterality: Left;    I & D EXTREMITY Left 06/10/2013   leg; from the OR in 11/2012   I & D EXTREMITY Left 06/10/2013   Procedure: IRRIGATION AND DEBRIDEMENT OF LEFT UPPER LEG;  Surgeon: Gaile LELON New, MD;  Location: [facility] OR;  Service: Vascular;  Laterality: Left;   LOWER EXTREMITY ANGIOGRAM N/A 10/01/2012   Procedure: LOWER EXTREMITY ANGIOGRAM;  Surgeon: Candyce GORMAN Reek, MD;  Location: [facility] CATH LAB;  Service: Cardiovascular;  Laterality: N/A;   POLYPECTOMY     stomach   PYLOROPLASTY     TONSILLECTOMY     TRANSURETHRAL RESECTION OF PROSTATE     UPPER GASTROINTESTINAL ENDOSCOPY       Current Meds  Medication Sig   acetaminophen  (TYLENOL ) 500 MG tablet Take 1,000 mg by mouth at bedtime.   alum & mag hydroxide-simeth (MAALOX/MYLANTA) 200-200-20 MG/5ML suspension Take 15 mLs by mouth daily.   clopidogrel  (PLAVIX ) 75 MG tablet Take 1 tablet (75 mg total) by mouth daily.   feeding supplement (BOOST HIGH PROTEIN) LIQD Take 1 Container by mouth 2 (two) times daily between meals.   levothyroxine  (SYNTHROID , LEVOTHROID) 125 MCG tablet Take 125  mcg by mouth at bedtime.   nitroGLYCERIN  (NITROSTAT ) 0.4 MG SL tablet Place 1 tablet (0.4 mg total) under the tongue every 5 (five) minutes as needed for chest pain.   Omega 3-6-9 Fatty Acids (OMEGA 3-6-9 COMPLEX PO) Take 2 capsules by mouth daily.   omeprazole  (PRILOSEC) 40 MG capsule Take 1 capsule (40 mg total) by mouth daily. Office visit for further refills   rosuvastatin  (CRESTOR ) 10 MG tablet Take 10 mg by mouth daily.     Allergies:   Penicillins   Social History   Tobacco Use   Smoking status: Former    Current packs/day: 0.00    Average packs/day: 1 pack/day for 20.0 years (20.0 ttl pk-yrs)    Types: Cigarettes    Start date: 06/28/1965    Quit date: 06/28/1985    Years since quitting: 38.2   Smokeless tobacco: Never  Vaping Use   Vaping status: Never Used  Substance Use Topics   Alcohol use: No    Comment: 06/10/2013 drank recreationally when I did  drink; stopped ~ 2010   Drug use: No     Family Hx: The patient's family history includes Alzheimer's disease in his sister; Heart disease in his mother and sister; Hypertension in his sister; Lung cancer in his brother; Melanoma in his father. There is no history of Colon cancer, Colon polyps, Rectal cancer, Pancreatic cancer, Stomach cancer, or Esophageal cancer.  ROS:   Please see the history of present illness.    All other systems reviewed and are negative.   Prior CV studies:    The following studies were reviewed:  Echocardiogram 10/11/2022: Impressions: 1. Left ventricular ejection fraction, by estimation, is 55 to 60%. The  left ventricle has normal function. The left ventricle has no regional  wall motion abnormalities. Left ventricular diastolic parameters are  consistent with Grade I diastolic  dysfunction (impaired relaxation).   2. Right ventricular systolic function is normal. The right ventricular  size is normal. There is normal pulmonary artery systolic pressure.   3. Left atrial size was mildly dilated.   4. The mitral valve is normal in structure. Mild mitral valve  regurgitation. No evidence of mitral stenosis.   5. The aortic valve is normal in structure. Aortic valve regurgitation is  not visualized. No aortic stenosis is present.   6. The inferior vena cava is normal in size with greater than 50%  respiratory variability, suggesting right atrial pressure of 3 mmHg.  _______________   Monitor 10/17/2022 to 10/31/2022:   Patient had a minimum heart rate of 39 bpm, maximum heart rate of 175 bpm, and average heart rate of 52 bpm.   Predominant underlying rhythm was sinus rhythm.   Frequent SVT, most 5-6 beats but longest 24 seconds   Isolated PACs were rare (<1.0%).   Isolated PVCs were rare (<1.0%).   No high grade AV block.   Triggered and diary events associated with sinus rhythm.   Asymptomatic SVT. _______________   Lower Extremity Dopplers/ ABIs  07/14/2023: Summary: Left: Patent graft with no visualized stenosis. Proximal to distal graft  velocity < 40 cm/s with biphasic waveforms.    ABI Summary: Right: Resting right ankle-brachial index indicates mild right lower  extremity arterial disease. The right toe-brachial index is abnormal.   Left: Resting left ankle-brachial index is within normal range. The left  toe-brachial index is normal.  _______________  Coronary CTA 08/04/2023: Summary: 1. Coronary calcium  score of 586. This was 51st percentile for age-, sex, and  race-matched controls. 2. Total plaque volume 533 mm3 which is 29th percentile for age- and sex-matched controls (calcified plaque 76mm3; non-calcified plaque 421mm3). TPV is severe. 3. Normal coronary origin with right dominance. 4. Moderate atherosclerosis: 50-69% ostial to mid LAD 5.  This study has been submitted for FFR analysis of the LAD.  FFR Summary: 1. Coronary CTA FFR analysis demonstrates a possible hemodynamically flow limiting lesion in the mid LAD (FFR 0.94>>0.79>>0.71). 2. Recommend cardiac catheterization. _______________  Echocardiogram 08/20/2023: Impressions: 1. Left ventricular ejection fraction, by estimation, is 60 to 65%. The  left ventricle has normal function. The left ventricle has no regional  wall motion abnormalities. Left ventricular diastolic parameters are  consistent with Grade I diastolic  dysfunction (impaired relaxation).   2. Right ventricular systolic function is normal. The right ventricular  size is normal. There is normal pulmonary artery systolic pressure.   3. Left atrial size was mildly dilated.   4. The mitral valve is normal in structure. Mild mitral valve  regurgitation. No evidence of mitral stenosis.   5. The aortic valve is tricuspid. There is mild calcification of the  aortic valve. There is mild thickening of the aortic valve. Aortic valve  regurgitation is not visualized. Aortic valve  sclerosis/calcification is  present, without any evidence of  aortic stenosis.   6. The inferior vena cava is normal in size with greater than 50%  respiratory variability, suggesting right atrial pressure of 3 mmHg.    Labs/Other Tests and Data Reviewed:    EKG:  Last EKG form 07/15/2023 was personally reviewed and showed normal sinus rhythm, rate 62 bpm, with new downsloping ST segments and abnormal T waves in leads III, aVF, and V5-V6.   Recent Labs: 10/10/2022: Magnesium 2.1; TSH 0.731 10/11/2022: ALT 17 07/15/2023: BUN 37; Creatinine, Ser 1.36; Hemoglobin 13.6; Platelets 190; Potassium 5.3; Sodium 135   Recent Lipid Panel Lab Results  Component Value Date/Time   CHOL (H) 03/15/2008 04:45 AM    207        ATP III CLASSIFICATION:  <200     mg/dL   Desirable  799-760  mg/dL   Borderline High  >=759    mg/dL   High          TRIG 835 (H) 03/15/2008 04:45 AM   HDL 31 (L) 03/15/2008 04:45 AM   CHOLHDL 6.7 03/15/2008 04:45 AM   LDLCALC (H) 03/15/2008 04:45 AM    143        Total Cholesterol/HDL:CHD Risk Coronary Heart Disease Risk Table                     Men   Women  1/2 Average Risk   3.4   3.3  Average Risk       5.0   4.4  2 X Average Risk   9.6   7.1  3 X Average Risk  23.4   11.0        Use the calculated Patient Ratio above and the CHD Risk Table to determine the patient's CHD Risk.        ATP III CLASSIFICATION (LDL):  <100     mg/dL   Optimal  899-870  mg/dL   Near or Above                    Optimal  130-159  mg/dL   Borderline  839-810  mg/dL   High  >809     mg/dL   Very  High    Wt Readings from Last 3 Encounters:  09/03/23 180 lb (81.6 kg)  08/04/23 182 lb (82.6 kg)  07/15/23 181 lb 6.4 oz (82.3 kg)     Objective:    Vital Signs:  BP 129/73   Pulse (!) 54   Ht 5' 9 (1.753 m)   Wt 180 lb (81.6 kg)   SpO2 99%   BMI 26.58 kg/m    Vital Signs Reviewed. General: No acute distress. Pulm: No labored breathing. No coughing during visit. No audible  wheezing. Speaking in full sentences. Neuro: Alert and oriented. No slurred speech. Answers questions appropriately. Psych: Pleasant affect.  ASSESSMENT & PLAN:    Dyspnea on Exertion CAD Patient has a history of mild non-obstructive CAD noted on cardiac catheterization in 2010. He reported new dyspnea on exertion at visit in 07/2023 and EKG showed new T waver inversions. Echo showed normal LV function and no significant valvular disease. Coronary CTA showed a coronary calcium  score of  586 (51st percentile for age and sex) and moderate stenosis) 50-69% of ostial to mid LAD. FFR demonstrated a possibly hemodynamically flow limiting lesion in the mid LAD (FFR 0.94 >> 0.79 >> 0.71). Cardiac catheterization was recommended. - He continues to have some dyspnea on exertion. However, no dyspnea at rest or chest pain. - Continue Plavix  75mg  daily and Crestor  10mg  daily.  - R/ LHC is scheduled for tomorrow. Patient recently had labs drawn at PCP's office on 08/11/2023 and labs stable (Hgb 13.7, Plts 181, Cr 1.27).    Shared Decision Making/Informed Consent{ The risks [stroke (1 in 1000), death (1 in 1000), kidney failure [usually temporary] (1 in 500), bleeding (1 in 200), allergic reaction [possibly serious] (1 in 200)], benefits (diagnostic support and management of coronary artery disease) and alternatives of a cardiac catheterization were discussed in detail with Mr. Cedar and he is willing to proceed.  History of Atypical Chest Pain  Patient has a long history of upper chest tightness near shoulder when he is using exercise machine that requires a lot of arm movements. Previously been felt to be musculoskeletal in nature. - He states this has resolved.    Bradycardia Paroxysmal SVT Monitor in 10/2022 showed predominant sinus rhythm with average heart rate of 52 bpm with frequent SVT (most runs were 5-6 beats long but long run was 24 seconds) and isolated PACs/ PVCs but no high grade AV block or  pauses.  - Stable. No significant palpitations. Rate in the 50s today.  - Continue to avoid AV nodal agents.    PAD S/p left femoral-tibial bypass in 2014. Recent lower extremity dopplers on 07/16/2023 showed patent bypass graft with proximal to distal graft velocity <40 cm/s with a biphasic waveform. ABIs were normal on the left (1.18) and mildly reduce on the right (0.88).  - Continue Plavix  75mg  daily. - Continue Crestor  10mg  daily. - Followed by Vascular Surgery.   Orthostatic Hypotension Hypertension Patient has a history of hypertension complicated by symptomatic orthostatic hypotension.  - BP 129/73 on home machine today. - No symptoms of orthostatic hypotension.  - Unable to tolerate Florinef  due to lower extremity edema. He was previously on Midodrine  but stopped this on his own several months ago and has remained asymptomatic.  - Continue compression stockings (he was unable to tolerate the abdominal binder). Continue staying well hydrated and changing positions slowly.     Hyperlipidemia LDL 60 on recent labs at PCP's office on 08/11/2023. LDL goal <70.   -  Continue Crestor  10mg  daily.   CKD Stage IIIa Baseline creatinine around 1.2 to 1.3. Stable at 1.27 on 08/11/2023.   Hyperkalemia Potassium 5.3 on 07/14/2023. However, this was felt to be a lab error. Recheck on 08/11/2023 at PCP's office was normal at 4.4  Time:   Today, I have spent 9 minutes with the patient with telehealth technology discussing the above problems.     Medication Adjustments/Labs and Tests Ordered: Current medicines are reviewed at length with the patient today.  Concerns regarding medicines are outlined above.   Disposition: Follow up in 2-3 weeks after cardiac catheterization.  Signed, Kamyia Thomason E Di Jasmer, PA-C  09/03/2023 11:12 AM    [facility]

## 2023-08-25 NOTE — H&P (View-Only) (Signed)
 Virtual Visit via Telephone Note   Because of Walter [name] co-morbid illnesses, he is at least at moderate risk for complications without adequate follow up.  This format is felt to be most appropriate for this patient at this time.  The patient did not have access to video technology/had technical difficulties with video requiring transitioning to audio format only (telephone).  All issues noted in this document were discussed and addressed.  No physical exam could be performed with this format.  Please refer to the patient's chart for his consent to telehealth for [facility]. Virtual platform was offered given ongoing worsening Covid-19 pandemic.  Date:  09/03/2023   ID:  Walter [name], DOB [birth year], MRN [id number]  Patient Location: Home Provider Location: Office  PCP:  Walter Cole, MD  Cardiologist:  Walter DELENA Leavens, MD  Electrophysiologist:  None   Evaluation Performed:  Follow-Up Visit  Chief Complaint:  follow-up of abnormal coronary CTA  History of Present Illness:    Walter [name] is a 89 y.o. male with a history of mild non-obstructive CAD on cardiac catheterization in 2010, paroxysmal SVT,  bradycardia, PAD s/p left femoral-tibial bypass in 2014, hypertension complicated by orthostatic hypotension, hyperlipidemia, hypothyroidism, GERD with Barrett's esophagus, CKD stage IIIa, and anxiety/ depression who is followed by Walter [name] and presents for follow-up of abnormal coronary CTA and to discuss cardiac catheterization.   Remote cardiac catheterization in 2010 showed 30% stenosis of proximal LAD and otherwise clean coronaries. He was previously seen by Walter [name] in 03/2014 for PAD. Peripheral angiogram in 2014 showed severe right distal SFA disease and an occluded left popliteal artery as well as bilateral tibial disease. He underwent a left femoral-tibial bypass in 11/2012. His PAD is now followed by Vascular Surgery.   He was seen by Dr.  Leavens in 10/2022 after not being seen since 2016 for further evaluation of bradycardia after a recent admission for near syncope earlier that month. He was noted to be orthostatic at that time. However, he was also noted to be bradycardic so there was concern for symptomatic bradycardia. Echo showed LVEF of 55-60% with normal wall motion and grade 1 diastolic dysfunction, normal RV size and function, and mild MR. Outpatient Zio monitor was ordered and showed predominant sinus rhythm with average heart rate of 52 bpm with frequent SVT (most runs were 5-6 beats long but long run was 24 seconds) and isolated PACs/ PVCs but no high grade AV block or pauses. At the visit with Walter [name], he reported persistent dizziness only when standing. He also reported a recent episode of chest pain that could be angina (that was different the the usual pain he has when using the exercise machine). He was started on Fludrocortisone  for orthostatic hypotension. Plan was to get an exercise treadmill test to evaluate for chronotropic incompetence once bike testing is available. Plan was also to get a coronary CTA if he has recurrent chest pain. Fludrocortisone  was subsequently stopped and he was started on Midodrine  instead. He subsequently stopped Midodrine  on his own with significant improvement in his symptoms.   He was seen by me on 07/15/2023 and reported [name] significant  dyspnea on exertion over the last few months but was doing well from an orthostatic hypotension standpoint. EKG showed [name] T wave inversions. There was concern that dyspnea was an anginal equivalent. R/ LHC was arranged and Echo was ordered. However, insurance denied the R/ LHC so coronary CTA was ordered instead. He was  seen by me again on 08/04/2023. The coronary CTA had been done but results were still pending at time of visit due to a delay in submitting heartflow for plaque analysis. However, at that visit he reported he was no longer having  significant dyspnea on exertion. He was not having any chest pain either. Coronary CTA result came back and showed a coronary calcium  score of  586 (51st percentile for age and sex) and moderate stenosis) 50-69% of ostial to mid LAD. FFR demonstrated a possibly hemodynamically flow limiting lesion in the mid LAD (FFR 0.94 >> 0.79 >> 0.71). Cardiac catheterization was recommended. There has been delay in arranging this due to patient having difficult arranging someone to take him home.   Cardiac catheterization is scheduled for tomorrow so he presents today for a telephone visit for updated H&P for procedure.  He does report dyspnea on exertion again today but no shortness of breath at rest.  No orthopnea or PND.  He has chronic lower extremity edema mainly in his left leg but this is stable.  No chest pain, palpitations, lightheadedness/dizziness, syncope.  He has no questions about planned right/left cardiac catheterization tomorrow.  Past Medical History:  Diagnosis Date   Anemia    pt denies this   Anxiety    Arthritis    legs and feet (06/10/2013)   Barrett's esophagus    never dilated (06/10/2013)   Cataract    Chronic kidney disease    stage 3   Colon polyps    tubular adenoma-last colon 12/18/2005   Depression    Diverticulosis    GERD (gastroesophageal reflux disease)    takes omeprazole    Hematoma    on mid back   Hiatal hernia    High cholesterol    Hypertension    Hypothyroidism    takes synthroid    Peripheral vascular disease (HCC)    Past Surgical History:  Procedure Laterality Date   CARDIAC CATHETERIZATION     CATARACT EXTRACTION W/ INTRAOCULAR LENS  IMPLANT, BILATERAL Bilateral    COLONOSCOPY     EYE SURGERY     FEMORAL-POPLITEAL BYPASS GRAFT Left 11/27/2012   Procedure: BYPASS GRAFT LEFT ABOVE KNEE TO BELOW KNEE POPLITEAL ARTERY USING LEFT NONREVERSED GREATER SAPPHENOUS VEIN;  Surgeon: Walter Walter New, MD;  Location: [facility] OR;  Service: Vascular;  Laterality: Left;    I & D EXTREMITY Left 06/10/2013   leg; from the OR in 11/2012   I & D EXTREMITY Left 06/10/2013   Procedure: IRRIGATION AND DEBRIDEMENT OF LEFT UPPER LEG;  Surgeon: Walter Walter New, MD;  Location: [facility] OR;  Service: Vascular;  Laterality: Left;   LOWER EXTREMITY ANGIOGRAM N/A 10/01/2012   Procedure: LOWER EXTREMITY ANGIOGRAM;  Surgeon: Candyce GORMAN Reek, MD;  Location: [facility] CATH LAB;  Service: Cardiovascular;  Laterality: N/A;   POLYPECTOMY     stomach   PYLOROPLASTY     TONSILLECTOMY     TRANSURETHRAL RESECTION OF PROSTATE     UPPER GASTROINTESTINAL ENDOSCOPY       Current Meds  Medication Sig   acetaminophen  (TYLENOL ) 500 MG tablet Take 1,000 mg by mouth at bedtime.   alum & mag hydroxide-simeth (MAALOX/MYLANTA) 200-200-20 MG/5ML suspension Take 15 mLs by mouth daily.   clopidogrel  (PLAVIX ) 75 MG tablet Take 1 tablet (75 mg total) by mouth daily.   feeding supplement (BOOST HIGH PROTEIN) LIQD Take 1 Container by mouth 2 (two) times daily between meals.   levothyroxine  (SYNTHROID , LEVOTHROID) 125 MCG tablet Take 125  mcg by mouth at bedtime.   nitroGLYCERIN  (NITROSTAT ) 0.4 MG SL tablet Place 1 tablet (0.4 mg total) under the tongue every 5 (five) minutes as needed for chest pain.   Omega 3-6-9 Fatty Acids (OMEGA 3-6-9 COMPLEX PO) Take 2 capsules by mouth daily.   omeprazole  (PRILOSEC) 40 MG capsule Take 1 capsule (40 mg total) by mouth daily. Office visit for further refills   rosuvastatin  (CRESTOR ) 10 MG tablet Take 10 mg by mouth daily.     Allergies:   Penicillins   Social History   Tobacco Use   Smoking status: Former    Current packs/day: 0.00    Average packs/day: 1 pack/day for 20.0 years (20.0 ttl pk-yrs)    Types: Cigarettes    Start date: 06/28/1965    Quit date: 06/28/1985    Years since quitting: 38.2   Smokeless tobacco: Never  Vaping Use   Vaping status: Never Used  Substance Use Topics   Alcohol use: No    Comment: 06/10/2013 drank recreationally when I did  drink; stopped ~ 2010   Drug use: No     Family Hx: The patient's family history includes Alzheimer's disease in his sister; Heart disease in his mother and sister; Hypertension in his sister; Lung cancer in his brother; Melanoma in his father. There is no history of Colon cancer, Colon polyps, Rectal cancer, Pancreatic cancer, Stomach cancer, or Esophageal cancer.  ROS:   Please see the history of present illness.    All other systems reviewed and are negative.   Prior CV studies:    The following studies were reviewed:  Echocardiogram 10/11/2022: Impressions: 1. Left ventricular ejection fraction, by estimation, is 55 to 60%. The  left ventricle has normal function. The left ventricle has no regional  wall motion abnormalities. Left ventricular diastolic parameters are  consistent with Grade I diastolic  dysfunction (impaired relaxation).   2. Right ventricular systolic function is normal. The right ventricular  size is normal. There is normal pulmonary artery systolic pressure.   3. Left atrial size was mildly dilated.   4. The mitral valve is normal in structure. Mild mitral valve  regurgitation. No evidence of mitral stenosis.   5. The aortic valve is normal in structure. Aortic valve regurgitation is  not visualized. No aortic stenosis is present.   6. The inferior vena cava is normal in size with greater than 50%  respiratory variability, suggesting right atrial pressure of 3 mmHg.  _______________   Monitor 10/17/2022 to 10/31/2022:   Patient had a minimum heart rate of 39 bpm, maximum heart rate of 175 bpm, and average heart rate of 52 bpm.   Predominant underlying rhythm was sinus rhythm.   Frequent SVT, most 5-6 beats but longest 24 seconds   Isolated PACs were rare (<1.0%).   Isolated PVCs were rare (<1.0%).   No high grade AV block.   Triggered and diary events associated with sinus rhythm.   Asymptomatic SVT. _______________   Lower Extremity Dopplers/ ABIs  07/14/2023: Summary: Left: Patent graft with no visualized stenosis. Proximal to distal graft  velocity < 40 cm/s with biphasic waveforms.    ABI Summary: Right: Resting right ankle-brachial index indicates mild right lower  extremity arterial disease. The right toe-brachial index is abnormal.   Left: Resting left ankle-brachial index is within normal range. The left  toe-brachial index is normal.  _______________  Coronary CTA 08/04/2023: Summary: 1. Coronary calcium  score of 586. This was 51st percentile for age-, sex, and  race-matched controls. 2. Total plaque volume 533 mm3 which is 29th percentile for age- and sex-matched controls (calcified plaque 76mm3; non-calcified plaque 421mm3). TPV is severe. 3. Normal coronary origin with right dominance. 4. Moderate atherosclerosis: 50-69% ostial to mid LAD 5.  This study has been submitted for FFR analysis of the LAD.  FFR Summary: 1. Coronary CTA FFR analysis demonstrates a possible hemodynamically flow limiting lesion in the mid LAD (FFR 0.94>>0.79>>0.71). 2. Recommend cardiac catheterization. _______________  Echocardiogram 08/20/2023: Impressions: 1. Left ventricular ejection fraction, by estimation, is 60 to 65%. The  left ventricle has normal function. The left ventricle has no regional  wall motion abnormalities. Left ventricular diastolic parameters are  consistent with Grade I diastolic  dysfunction (impaired relaxation).   2. Right ventricular systolic function is normal. The right ventricular  size is normal. There is normal pulmonary artery systolic pressure.   3. Left atrial size was mildly dilated.   4. The mitral valve is normal in structure. Mild mitral valve  regurgitation. No evidence of mitral stenosis.   5. The aortic valve is tricuspid. There is mild calcification of the  aortic valve. There is mild thickening of the aortic valve. Aortic valve  regurgitation is not visualized. Aortic valve  sclerosis/calcification is  present, without any evidence of  aortic stenosis.   6. The inferior vena cava is normal in size with greater than 50%  respiratory variability, suggesting right atrial pressure of 3 mmHg.    Labs/Other Tests and Data Reviewed:    EKG:  Last EKG form 07/15/2023 was personally reviewed and showed normal sinus rhythm, rate 62 bpm, with [name] downsloping ST segments and abnormal T waves in leads III, aVF, and V5-V6.   Recent Labs: 10/10/2022: Magnesium 2.1; TSH 0.731 10/11/2022: ALT 17 07/15/2023: BUN 37; Creatinine, Ser 1.36; Hemoglobin 13.6; Platelets 190; Potassium 5.3; Sodium 135   Recent Lipid Panel Lab Results  Component Value Date/Time   CHOL (H) 03/15/2008 04:45 AM    207        ATP III CLASSIFICATION:  <200     mg/dL   Desirable  799-760  mg/dL   Borderline High  >=759    mg/dL   High          TRIG 835 (H) 03/15/2008 04:45 AM   HDL 31 (L) 03/15/2008 04:45 AM   CHOLHDL 6.7 03/15/2008 04:45 AM   LDLCALC (H) 03/15/2008 04:45 AM    143        Total Cholesterol/HDL:CHD Risk Coronary Heart Disease Risk Table                     Men   Women  1/2 Average Risk   3.4   3.3  Average Risk       5.0   4.4  2 X Average Risk   9.6   7.1  3 X Average Risk  23.4   11.0        Use the calculated Patient Ratio above and the CHD Risk Table to determine the patient's CHD Risk.        ATP III CLASSIFICATION (LDL):  <100     mg/dL   Optimal  899-870  mg/dL   Near or Above                    Optimal  130-159  mg/dL   Borderline  839-810  mg/dL   High  >809     mg/dL   Very  High    Wt Readings from Last 3 Encounters:  09/03/23 180 lb (81.6 kg)  08/04/23 182 lb (82.6 kg)  07/15/23 181 lb 6.4 oz (82.3 kg)     Objective:    Vital Signs:  BP 129/73   Pulse (!) 54   Ht 5' 9 (1.753 m)   Wt 180 lb (81.6 kg)   SpO2 99%   BMI 26.58 kg/m    Vital Signs Reviewed. General: No acute distress. Pulm: No labored breathing. No coughing during visit. No audible  wheezing. Speaking in full sentences. Neuro: Alert and oriented. No slurred speech. Answers questions appropriately. Psych: Pleasant affect.  ASSESSMENT & PLAN:    Dyspnea on Exertion CAD Patient has a history of mild non-obstructive CAD noted on cardiac catheterization in 2010. He reported [name] dyspnea on exertion at visit in 07/2023 and EKG showed [name] T waver inversions. Echo showed normal LV function and no significant valvular disease. Coronary CTA showed a coronary calcium  score of  586 (51st percentile for age and sex) and moderate stenosis) 50-69% of ostial to mid LAD. FFR demonstrated a possibly hemodynamically flow limiting lesion in the mid LAD (FFR 0.94 >> 0.79 >> 0.71). Cardiac catheterization was recommended. - He continues to have some dyspnea on exertion. However, no dyspnea at rest or chest pain. - Continue Plavix  75mg  daily and Crestor  10mg  daily.  - R/ LHC is scheduled for tomorrow. Patient recently had labs drawn at PCP's office on 08/11/2023 and labs stable (Hgb 13.7, Plts 181, Cr 1.27).    Shared Decision Making/Informed Consent{ The risks [stroke (1 in 1000), death (1 in 1000), kidney failure [usually temporary] (1 in 500), bleeding (1 in 200), allergic reaction [possibly serious] (1 in 200)], benefits (diagnostic support and management of coronary artery disease) and alternatives of a cardiac catheterization were discussed in detail with Mr. Cedar and he is willing to proceed.  History of Atypical Chest Pain  Patient has a long history of upper chest tightness near shoulder when he is using exercise machine that requires a lot of arm movements. Previously been felt to be musculoskeletal in nature. - He states this has resolved.    Bradycardia Paroxysmal SVT Monitor in 10/2022 showed predominant sinus rhythm with average heart rate of 52 bpm with frequent SVT (most runs were 5-6 beats long but long run was 24 seconds) and isolated PACs/ PVCs but no high grade AV block or  pauses.  - Stable. No significant palpitations. Rate in the 50s today.  - Continue to avoid AV nodal agents.    PAD S/p left femoral-tibial bypass in 2014. Recent lower extremity dopplers on 07/16/2023 showed patent bypass graft with proximal to distal graft velocity <40 cm/s with a biphasic waveform. ABIs were normal on the left (1.18) and mildly reduce on the right (0.88).  - Continue Plavix  75mg  daily. - Continue Crestor  10mg  daily. - Followed by Vascular Surgery.   Orthostatic Hypotension Hypertension Patient has a history of hypertension complicated by symptomatic orthostatic hypotension.  - BP 129/73 on home machine today. - No symptoms of orthostatic hypotension.  - Unable to tolerate Florinef  due to lower extremity edema. He was previously on Midodrine  but stopped this on his own several months ago and has remained asymptomatic.  - Continue compression stockings (he was unable to tolerate the abdominal binder). Continue staying well hydrated and changing positions slowly.     Hyperlipidemia LDL 60 on recent labs at PCP's office on 08/11/2023. LDL goal <70.   -  Continue Crestor  10mg  daily.   CKD Stage IIIa Baseline creatinine around 1.2 to 1.3. Stable at 1.27 on 08/11/2023.   Hyperkalemia Potassium 5.3 on 07/14/2023. However, this was felt to be a lab error. Recheck on 08/11/2023 at PCP's office was normal at 4.4  Time:   Today, I have spent 9 minutes with the patient with telehealth technology discussing the above problems.     Medication Adjustments/Labs and Tests Ordered: Current medicines are reviewed at length with the patient today.  Concerns regarding medicines are outlined above.   Disposition: Follow up in 2-3 weeks after cardiac catheterization.  Signed, Kamyia Thomason E Di Jasmer, PA-C  09/03/2023 11:12 AM    [facility]

## 2023-09-03 ENCOUNTER — Telehealth: Payer: Self-pay | Admitting: *Deleted

## 2023-09-03 ENCOUNTER — Encounter: Payer: Self-pay | Admitting: Student

## 2023-09-03 ENCOUNTER — Ambulatory Visit: Attending: Student | Admitting: Student

## 2023-09-03 VITALS — BP 129/73 | HR 54 | Ht 69.0 in | Wt 180.0 lb

## 2023-09-03 DIAGNOSIS — R001 Bradycardia, unspecified: Secondary | ICD-10-CM

## 2023-09-03 DIAGNOSIS — I951 Orthostatic hypotension: Secondary | ICD-10-CM | POA: Diagnosis not present

## 2023-09-03 DIAGNOSIS — R0609 Other forms of dyspnea: Secondary | ICD-10-CM

## 2023-09-03 DIAGNOSIS — R0789 Other chest pain: Secondary | ICD-10-CM | POA: Diagnosis not present

## 2023-09-03 DIAGNOSIS — I10 Essential (primary) hypertension: Secondary | ICD-10-CM

## 2023-09-03 DIAGNOSIS — I2510 Atherosclerotic heart disease of native coronary artery without angina pectoris: Secondary | ICD-10-CM

## 2023-09-03 DIAGNOSIS — E785 Hyperlipidemia, unspecified: Secondary | ICD-10-CM

## 2023-09-03 DIAGNOSIS — N1831 Chronic kidney disease, stage 3a: Secondary | ICD-10-CM | POA: Diagnosis not present

## 2023-09-03 DIAGNOSIS — I739 Peripheral vascular disease, unspecified: Secondary | ICD-10-CM | POA: Diagnosis not present

## 2023-09-03 DIAGNOSIS — I471 Supraventricular tachycardia, unspecified: Secondary | ICD-10-CM | POA: Diagnosis not present

## 2023-09-03 NOTE — Patient Instructions (Addendum)
 Medication Instructions:  Your physician recommends that you continue on your current medications as directed. Please refer to the Current Medication list given to you today.  Labwork: None   Testing/Procedures: None   Follow-Up: Your physician recommends that you schedule a follow-up appointment in: 2-3 Weeks   Any Other Special Instructions Will Be Listed Below (If Applicable).  If you need a refill on your cardiac medications before your next appointment, please call your pharmacy.

## 2023-09-03 NOTE — Telephone Encounter (Signed)
 Cardiac Catheterization scheduled at [facility] for: Thursday September 04, 2023 9 AM Arrival time [facility] Main Entrance A at: 7 AM  Diet: -Nothing to eat after midnight.  Hydration: -May drink clear liquids until 2 hours before the procedure.  Approved liquids: Water , clear tea, black coffee, fruit juices-non-citric and without pulp,Gatorade, plain Jello/popsicles.   -Please drink 16 oz bottle of water  2 hours before procedure.  Medication instructions: -Usual morning medications can be taken including  Plavix  75 mg.  Patient tells me he does not have aspirin  81 mg.  Plan to go home the same day, you will only stay overnight if medically necessary.  You must have responsible adult to drive you home.  Someone must be with you the first 24 hours after you arrive home.  Reviewed procedure instructions with patient.

## 2023-09-04 ENCOUNTER — Other Ambulatory Visit: Payer: Self-pay

## 2023-09-04 ENCOUNTER — Ambulatory Visit (HOSPITAL_COMMUNITY)
Admission: RE | Admit: 2023-09-04 | Discharge: 2023-09-04 | Disposition: A | Discharge status: Home / Self Care | Attending: Internal Medicine | Admitting: Internal Medicine

## 2023-09-04 ENCOUNTER — Encounter (HOSPITAL_COMMUNITY)
Admission: RE | Disposition: A | Discharge status: Home / Self Care | Payer: Self-pay | Source: Home / Self Care | Attending: Internal Medicine

## 2023-09-04 DIAGNOSIS — Z79890 Hormone replacement therapy (postmenopausal): Secondary | ICD-10-CM | POA: Diagnosis not present

## 2023-09-04 DIAGNOSIS — R931 Abnormal findings on diagnostic imaging of heart and coronary circulation: Secondary | ICD-10-CM | POA: Diagnosis present

## 2023-09-04 DIAGNOSIS — I471 Supraventricular tachycardia, unspecified: Secondary | ICD-10-CM | POA: Insufficient documentation

## 2023-09-04 DIAGNOSIS — Z7902 Long term (current) use of antithrombotics/antiplatelets: Secondary | ICD-10-CM | POA: Diagnosis not present

## 2023-09-04 DIAGNOSIS — F419 Anxiety disorder, unspecified: Secondary | ICD-10-CM | POA: Diagnosis not present

## 2023-09-04 DIAGNOSIS — I2584 Coronary atherosclerosis due to calcified coronary lesion: Secondary | ICD-10-CM | POA: Diagnosis not present

## 2023-09-04 DIAGNOSIS — I739 Peripheral vascular disease, unspecified: Secondary | ICD-10-CM | POA: Diagnosis not present

## 2023-09-04 DIAGNOSIS — R001 Bradycardia, unspecified: Secondary | ICD-10-CM | POA: Diagnosis not present

## 2023-09-04 DIAGNOSIS — E039 Hypothyroidism, unspecified: Secondary | ICD-10-CM | POA: Insufficient documentation

## 2023-09-04 DIAGNOSIS — Z79899 Other long term (current) drug therapy: Secondary | ICD-10-CM | POA: Diagnosis not present

## 2023-09-04 DIAGNOSIS — K219 Gastro-esophageal reflux disease without esophagitis: Secondary | ICD-10-CM | POA: Insufficient documentation

## 2023-09-04 DIAGNOSIS — I129 Hypertensive chronic kidney disease with stage 1 through stage 4 chronic kidney disease, or unspecified chronic kidney disease: Secondary | ICD-10-CM | POA: Insufficient documentation

## 2023-09-04 DIAGNOSIS — I2510 Atherosclerotic heart disease of native coronary artery without angina pectoris: Secondary | ICD-10-CM | POA: Insufficient documentation

## 2023-09-04 DIAGNOSIS — Z87891 Personal history of nicotine dependence: Secondary | ICD-10-CM | POA: Diagnosis not present

## 2023-09-04 DIAGNOSIS — F32A Depression, unspecified: Secondary | ICD-10-CM | POA: Diagnosis not present

## 2023-09-04 DIAGNOSIS — N1831 Chronic kidney disease, stage 3a: Secondary | ICD-10-CM | POA: Diagnosis not present

## 2023-09-04 DIAGNOSIS — I951 Orthostatic hypotension: Secondary | ICD-10-CM | POA: Insufficient documentation

## 2023-09-04 DIAGNOSIS — R0609 Other forms of dyspnea: Secondary | ICD-10-CM

## 2023-09-04 DIAGNOSIS — E785 Hyperlipidemia, unspecified: Secondary | ICD-10-CM | POA: Diagnosis not present

## 2023-09-04 HISTORY — PX: CORONARY PRESSURE/FFR STUDY: CATH118243

## 2023-09-04 HISTORY — PX: RIGHT/LEFT HEART CATH AND CORONARY ANGIOGRAPHY: CATH118266

## 2023-09-04 LAB — POCT I-STAT 7, (LYTES, BLD GAS, ICA,H+H)
Acid-base deficit: 1 mmol/L (ref 0.0–2.0)
Bicarbonate: 24.9 mmol/L (ref 20.0–28.0)
Calcium, Ion: 1.26 mmol/L (ref 1.15–1.40)
HCT: 35 % — ABNORMAL LOW (ref 39.0–52.0)
Hemoglobin: 11.9 g/dL — ABNORMAL LOW (ref 13.0–17.0)
O2 Saturation: 99 %
Potassium: 4.2 mmol/L (ref 3.5–5.1)
Sodium: 132 mmol/L — ABNORMAL LOW (ref 135–145)
TCO2: 26 mmol/L (ref 22–32)
pCO2 arterial: 43.3 mmHg (ref 32–48)
pH, Arterial: 7.368 (ref 7.35–7.45)
pO2, Arterial: 151 mmHg — ABNORMAL HIGH (ref 83–108)

## 2023-09-04 SURGERY — RIGHT/LEFT HEART CATH AND CORONARY ANGIOGRAPHY
Anesthesia: LOCAL

## 2023-09-04 MED ORDER — SODIUM CHLORIDE 0.9 % IV SOLN: 250.0000 mL | INTRAVENOUS | Status: DC | PRN

## 2023-09-04 MED ORDER — VERAPAMIL HCL 2.5 MG/ML IV SOLN
INTRAVENOUS | Status: AC
Start: 2023-09-04 — End: 2023-09-04
  Filled 2023-09-04: qty 2

## 2023-09-04 MED ORDER — SODIUM CHLORIDE 0.9% FLUSH: 3.0000 mL | INTRAVENOUS | Status: DC | PRN

## 2023-09-04 MED ORDER — MIDAZOLAM HCL 2 MG/2ML IJ SOLN
INTRAMUSCULAR | Status: DC | PRN
  Administered 2023-09-04: .5 mg via INTRAVENOUS

## 2023-09-04 MED ORDER — NITROGLYCERIN 1 MG/10 ML FOR IR/CATH LAB
INTRA_ARTERIAL | Status: DC | PRN
  Administered 2023-09-04: 200 ug via INTRACORONARY

## 2023-09-04 MED ORDER — FREE WATER: 500.0000 mL | Freq: Once | Status: DC

## 2023-09-04 MED ORDER — HEPARIN SODIUM (PORCINE) 1000 UNIT/ML IJ SOLN
INTRAMUSCULAR | Status: AC
  Filled 2023-09-04: qty 10

## 2023-09-04 MED ORDER — SODIUM CHLORIDE 0.9% FLUSH: 3.0000 mL | Freq: Two times a day (BID) | INTRAVENOUS | Status: DC

## 2023-09-04 MED ORDER — MIDAZOLAM HCL 2 MG/2ML IJ SOLN
INTRAMUSCULAR | Status: AC
  Filled 2023-09-04: qty 2

## 2023-09-04 MED ORDER — HEPARIN (PORCINE) IN NACL 1000-0.9 UT/500ML-% IV SOLN
INTRAVENOUS | Status: DC | PRN
Start: 2023-09-04 — End: 2023-09-04
  Administered 2023-09-04: 1000 mL

## 2023-09-04 MED ORDER — ACETAMINOPHEN 325 MG PO TABS: 650.0000 mg | ORAL_TABLET | ORAL | Status: DC | PRN

## 2023-09-04 MED ORDER — HYDRALAZINE HCL 20 MG/ML IJ SOLN
10.0000 mg | INTRAMUSCULAR | Status: DC | PRN
  Administered 2023-09-04: 10 mg via INTRAVENOUS
  Filled 2023-09-04: qty 1

## 2023-09-04 MED ORDER — LIDOCAINE HCL (PF) 1 % IJ SOLN
INTRAMUSCULAR | Status: AC
  Filled 2023-09-04: qty 30

## 2023-09-04 MED ORDER — IOHEXOL 350 MG/ML SOLN
INTRAVENOUS | Status: DC | PRN
  Administered 2023-09-04: 30 mL

## 2023-09-04 MED ORDER — HEPARIN SODIUM (PORCINE) 1000 UNIT/ML IJ SOLN
INTRAMUSCULAR | Status: DC | PRN
  Administered 2023-09-04 (×2): 4000 [IU] via INTRAVENOUS

## 2023-09-04 MED ORDER — FENTANYL CITRATE (PF) 100 MCG/2ML IJ SOLN
INTRAMUSCULAR | Status: AC
  Filled 2023-09-04: qty 2

## 2023-09-04 MED ORDER — ASPIRIN 81 MG PO CHEW
CHEWABLE_TABLET | ORAL | Status: AC
Start: 2023-09-04 — End: 2023-09-04
  Filled 2023-09-04: qty 1

## 2023-09-04 MED ORDER — FENTANYL CITRATE (PF) 100 MCG/2ML IJ SOLN
INTRAMUSCULAR | Status: DC | PRN
  Administered 2023-09-04 (×2): 12.5 ug via INTRAVENOUS

## 2023-09-04 MED ORDER — ONDANSETRON HCL 4 MG/2ML IJ SOLN: 4.0000 mg | Freq: Four times a day (QID) | INTRAMUSCULAR | Status: DC | PRN

## 2023-09-04 MED ORDER — NITROGLYCERIN 1 MG/10 ML FOR IR/CATH LAB
INTRA_ARTERIAL | Status: AC
  Filled 2023-09-04: qty 10

## 2023-09-04 MED ORDER — VERAPAMIL HCL 2.5 MG/ML IV SOLN
INTRAVENOUS | Status: DC | PRN
  Administered 2023-09-04: 10 mL via INTRA_ARTERIAL

## 2023-09-04 MED ORDER — ASPIRIN 81 MG PO CHEW
81.0000 mg | CHEWABLE_TABLET | Freq: Once | ORAL | Status: AC
  Administered 2023-09-04: 81 mg via ORAL

## 2023-09-04 MED ORDER — LIDOCAINE HCL (PF) 1 % IJ SOLN
INTRAMUSCULAR | Status: DC | PRN
  Administered 2023-09-04: 5 mL

## 2023-09-04 SURGICAL SUPPLY — 11 items
CATH 5FR JL3.5 JR4 ANG PIG MP (CATHETERS) IMPLANT
CATH BALLN WEDGE 5F 110CM (CATHETERS) IMPLANT
CATH LAUNCHER 5F JL3.5 (CATHETERS) IMPLANT
DEVICE RAD COMP TR BAND LRG (VASCULAR PRODUCTS) IMPLANT
GLIDESHEATH SLEND SS 6F .021 (SHEATH) IMPLANT
GUIDEWIRE INQWIRE 1.5J.035X260 (WIRE) IMPLANT
GUIDEWIRE PRESSURE X 175 (WIRE) IMPLANT
KIT ESSENTIALS PG (KITS) IMPLANT
PACK CARDIAC CATHETERIZATION (CUSTOM PROCEDURE TRAY) ×1 IMPLANT
SET ATX-X65L (MISCELLANEOUS) IMPLANT
SHEATH GLIDE SLENDER 4/5FR (SHEATH) IMPLANT

## 2023-09-04 NOTE — Interval H&P Note (Signed)
 History and Physical Interval Note:  09/04/2023 9:00 AM  Walter [name]  has presented today for surgery, with the diagnosis of fatigue and abnormal cardiac CTA.  The various methods of treatment have been discussed with the patient and family. After consideration of risks, benefits and other options for treatment, the patient has consented to  Procedure(s): RIGHT/LEFT HEART CATH AND CORONARY ANGIOGRAPHY (N/A) as a surgical intervention.  The patient's history has been reviewed, patient examined, no change in status, stable for surgery.  I have reviewed the patient's chart and labs.  Questions were answered to the patient's satisfaction.    Cath Lab Visit (complete for each Cath Lab visit)  Clinical Evaluation Leading to the Procedure:   ACS: No.  Non-ACS:    Anginal Classification: CCS II  Anti-ischemic medical therapy: No Therapy  Non-Invasive Test Results: Cardiac CTA with possible significant mid LAD disease by CT-FFR (intermediate risk)  Prior CABG: No previous CABG  Timber Lucarelli

## 2023-09-05 ENCOUNTER — Encounter (HOSPITAL_COMMUNITY): Payer: Self-pay | Admitting: Internal Medicine

## 2023-09-07 DIAGNOSIS — N1831 Chronic kidney disease, stage 3a: Secondary | ICD-10-CM | POA: Diagnosis not present

## 2023-09-07 DIAGNOSIS — M150 Primary generalized (osteo)arthritis: Secondary | ICD-10-CM | POA: Diagnosis not present

## 2023-09-07 DIAGNOSIS — E782 Mixed hyperlipidemia: Secondary | ICD-10-CM | POA: Diagnosis not present

## 2023-09-07 DIAGNOSIS — E039 Hypothyroidism, unspecified: Secondary | ICD-10-CM | POA: Diagnosis not present

## 2023-09-09 LAB — POCT I-STAT EG7
Acid-base deficit: 3 mmol/L — ABNORMAL HIGH (ref 0.0–2.0)
Bicarbonate: 23.3 mmol/L (ref 20.0–28.0)
Calcium, Ion: 1.16 mmol/L (ref 1.15–1.40)
HCT: 34 % — ABNORMAL LOW (ref 39.0–52.0)
Hemoglobin: 11.6 g/dL — ABNORMAL LOW (ref 13.0–17.0)
O2 Saturation: 80 %
Potassium: 3.8 mmol/L (ref 3.5–5.1)
Sodium: 130 mmol/L — ABNORMAL LOW (ref 135–145)
TCO2: 25 mmol/L (ref 22–32)
pCO2, Ven: 46.2 mmHg (ref 44–60)
pH, Ven: 7.312 (ref 7.25–7.43)
pO2, Ven: 48 mmHg — ABNORMAL HIGH (ref 32–45)

## 2023-09-09 LAB — POCT ACTIVATED CLOTTING TIME: Activated Clotting Time: 291 s

## 2023-09-17 ENCOUNTER — Encounter: Payer: Self-pay | Admitting: *Deleted

## 2023-09-17 ENCOUNTER — Ambulatory Visit: Admitting: Emergency Medicine

## 2023-09-19 ENCOUNTER — Ambulatory Visit: Attending: Emergency Medicine | Admitting: Emergency Medicine

## 2023-09-19 ENCOUNTER — Encounter: Payer: Self-pay | Admitting: Emergency Medicine

## 2023-09-19 VITALS — BP 148/72 | HR 52 | Ht 69.0 in | Wt 182.0 lb

## 2023-09-19 DIAGNOSIS — I739 Peripheral vascular disease, unspecified: Secondary | ICD-10-CM

## 2023-09-19 DIAGNOSIS — I951 Orthostatic hypotension: Secondary | ICD-10-CM

## 2023-09-19 DIAGNOSIS — E782 Mixed hyperlipidemia: Secondary | ICD-10-CM | POA: Diagnosis not present

## 2023-09-19 DIAGNOSIS — I10 Essential (primary) hypertension: Secondary | ICD-10-CM

## 2023-09-19 DIAGNOSIS — I471 Supraventricular tachycardia, unspecified: Secondary | ICD-10-CM

## 2023-09-19 DIAGNOSIS — N1831 Chronic kidney disease, stage 3a: Secondary | ICD-10-CM

## 2023-09-19 DIAGNOSIS — I2510 Atherosclerotic heart disease of native coronary artery without angina pectoris: Secondary | ICD-10-CM | POA: Diagnosis not present

## 2023-09-19 DIAGNOSIS — R0609 Other forms of dyspnea: Secondary | ICD-10-CM

## 2023-09-19 DIAGNOSIS — R001 Bradycardia, unspecified: Secondary | ICD-10-CM

## 2023-09-19 NOTE — Patient Instructions (Signed)
 Medication Instructions:  NO CHANGES  Lab Work: BMET TO BE DONE TODAY.   Testing/Procedures: NONE  Follow-Up: At [facility], you and your health needs are our priority.  As part of our continuing mission to provide you with exceptional heart care, our providers are all part of one team.  This team includes your primary Cardiologist (physician) and Advanced Practice Providers or APPs (Physician Assistants and Nurse Practitioners) who all work together to provide you with the care you need, when you need it.  Your next appointment:   KEEP November 19, 2023 WITH CALLIE GOODRICH  Provider:   Stanly DELENA Leavens, MD

## 2023-09-19 NOTE — Progress Notes (Addendum)
 Cardiology Office Note:    Date:  09/19/2023  ID:  Walter [name], DOB December 21, 1933, MRN [id number] PCP: Leonel Cole, MD  [facility] Providers Cardiologist:  Stanly DELENA Leavens, MD       Patient Profile:       Chief Complaint: Follow-up cardiac catheterization History of Present Illness:  Walter [name] is a 89 y.o. male with visit-pertinent history of mild nonobstructive CAD on cardiac catheterization in 2010, proximal SVT, bradycardia, PAD s/p left femoral-tibial bypass in 2014, hypertension complicated by orthostatic hypotension, hyperlipidemia, hypothyroidism, GERD with Barrett's esophagus, CKD stage IIIa, anxiety/depression  Remote cardiac catheterization in 2010 showed 30% stenosis of proximal LAD and otherwise clean coronary arteries.  He was recently seen by Dr. Dann in 03/2014 for PAD. Peripheral angiogram in 2014 showed severe right distal SFA disease and an occluded left popliteal artery as well as bilateral tibial disease. He underwent a left femoral-tibial bypass in 11/2012. His PAD is now followed by Vascular Surgery.   He was seen by Dr. Leavens in 10/2022 after not being seen since 2016 for further evaluation of bradycardia after a recent admission for near syncope earlier that month.  He was noted to be orthostatic on exam but also bradycardic so there was concern for symptomatic bradycardia.  Echocardiogram showed LVEF 55 to 60% with normal wall motion and grade 1 DD, normal RV size, and mild MR.  ZIO monitor showed predominant sinus rhythm with average heart rate 52 bpm and frequent SVT, isolated PACs/PVCs but no high-grade AV block or pauses.  During this visit he also reported episodes of chest pain. He was started on Fludrocortisone  for orthostatic hypotension. Plan was to get an exercise treadmill test to evaluate for chronotropic incompetence once bike testing is available. Plan was also to get a coronary CTA if he has recurrent chest pain. Fludrocortisone   was subsequently stopped and he was started on Midodrine  instead. He subsequently stopped Midodrine  on his own with significant improvement in his symptoms.   He was seen on 07/15/2023 at which time he reported significant new dyspnea on exertion.  He was doing well from a orthostatic hypotension standpoint.  EKG showed new T wave inversions.  There was concern that dyspnea was an anginal equivalent.  R/LHC was arranged and echo was ordered.  However her insurance did deny R/LHC so coronary CTA was ordered instead.  He had follow-up on 08/04/2023.  Reported significant improvement in dyspnea which has entirely resolved.  Coronary CTA was completed however was still awaiting final read during office visit.  No changes were made.  Coronary CTA resulted showing coronary calcium  score 586 (51st percentile) and total plaque volume of 533.  There was moderate atherosclerosis 50-69% ostial to mid LAD.  FFR analysis demonstrates possible hemodynamically flow-limiting lesion in the mid LAD.  Echocardiogram 8/13 showed LVEF 60 to 65%, no RWMA, grade 1 DD, RV function and size normal, normal PASP, mild MR.  He had telephone visit on 8/27.  He reported dyspnea on exertion continued.  He underwent cardiac catheterization on 8/28 showing moderate single-vessel coronary artery disease with sequential 30% and 50-60% mid LAD stenosis that are not hemodynamically significant.  Minimal luminal irregularities noted in the RCA.  Medical therapy was recommended.   Discussed the use of AI scribe software for clinical note transcription with the patient, who gave verbal consent to proceed.  History of Present Illness Walter [name] is an 89 year old male with coronary artery disease and hypertension who presents  after cardiac catheterization.  Today he is doing well.  He denies any acute complaints.  He experiences persistent shortness of breath with no changes since cardiac catheterization or last office visit.  He denies  smoking and has not seen a pulmonologist in the past.  He denies any chest pains, syncope, presyncope.  His hypertension is difficult to manage due to orthostatic hypotension. Antihypertensive medications were discontinued due to blood pressure fluctuations. Currently, he is not on any blood pressure medications. At home, his blood pressure readings have increased from the average 130s to 140s.  He monitors his blood pressure regularly and wears compression stockings to manage orthostatic hypotension.  He denies any lightheadedness, dizziness.  He experiences significant back and hip pain, which he believes may contribute to his shortness of breath. Pain limits his physical activity, requiring frequent rest after exertion.  Review of systems:  Please see the history of present illness. All other systems are reviewed and otherwise negative.      Studies Reviewed:        R/L cardiac catheterization 09/04/2023 Conclusions: Moderate single vessel coronary artery disease with sequential 30% and 50-60% mid LAD stenoses that are not hemodynamically significant (RFR = 0.91).  Minimal luminal irregularities noted in the RCA. Normal left and right heart filling pressures. Borderline elevated pulmonary artery pressure. Normal Fick cardiac output/index.   Recommendations: Continue medical therapy and risk factor modification to prevent progression of moderate coronary artery disease. Diagnostic Dominance: Right  Echocardiogram 08/20/2023 1. Left ventricular ejection fraction, by estimation, is 60 to 65%. The  left ventricle has normal function. The left ventricle has no regional  wall motion abnormalities. Left ventricular diastolic parameters are  consistent with Grade I diastolic  dysfunction (impaired relaxation).   2. Right ventricular systolic function is normal. The right ventricular  size is normal. There is normal pulmonary artery systolic pressure.   3. Left atrial size was mildly  dilated.   4. The mitral valve is normal in structure. Mild mitral valve  regurgitation. No evidence of mitral stenosis.   5. The aortic valve is tricuspid. There is mild calcification of the  aortic valve. There is mild thickening of the aortic valve. Aortic valve  regurgitation is not visualized. Aortic valve sclerosis/calcification is  present, without any evidence of  aortic stenosis.   6. The inferior vena cava is normal in size with greater than 50%  respiratory variability, suggesting right atrial pressure of 3 mmHg.   Coronary CTA 07/28/2023 1. Coronary calcium  score of 586. This was 51st percentile for age-, sex, and race-matched controls.   2. Total plaque volume 533 mm3 which is 29th percentile for age- and sex-matched controls (calcified plaque 65mm3; non-calcified plaque 425mm3). TPV is severe.   3. Normal coronary origin with right dominance.   4. Moderate atherosclerosis: 50-69% ostial to mid LAD.   5.  This study has been submitted for FFR analysis of the LAD.  1. Coronary CTA FFR analysis demonstrates a possible hemodynamically flow limiting lesion in the mid LAD (FFR 0.94>>0.79>>0.71).   2. Recommend cardiac catheterization.  ZIO 10/14/2022   Patient had a minimum heart rate of 39 bpm, maximum heart rate of 175 bpm, and average heart rate of 52 bpm.   Predominant underlying rhythm was sinus rhythm.   Frequent SVT, most 5-6 beats but longest 24 seconds   Isolated PACs were rare (<1.0%).   Isolated PVCs were rare (<1.0%).   No high grade AV block.   Triggered and diary events  associated with sinus rhythm.  Echocardiogram 10/11/2022  1. Left ventricular ejection fraction, by estimation, is 55 to 60%. The  left ventricle has normal function. The left ventricle has no regional  wall motion abnormalities. Left ventricular diastolic parameters are  consistent with Grade I diastolic  dysfunction (impaired relaxation).   2. Right ventricular systolic function is  normal. The right ventricular  size is normal. There is normal pulmonary artery systolic pressure.   3. Left atrial size was mildly dilated.   4. The mitral valve is normal in structure. Mild mitral valve  regurgitation. No evidence of mitral stenosis.   5. The aortic valve is normal in structure. Aortic valve regurgitation is  not visualized. No aortic stenosis is present.   6. The inferior vena cava is normal in size with greater than 50%  respiratory variability, suggesting right atrial pressure of 3 mmHg.   Risk Assessment/Calculations:     HYPERTENSION CONTROL Vitals:   09/19/23 1504 09/19/23 1541  BP: (!) 176/75 (!) 148/72    The patient's blood pressure is elevated above target today.  In order to address the patient's elevated BP: Blood pressure will be monitored at home to determine if medication changes need to be made.           Physical Exam:   VS:  BP (!) 148/72 (BP Location: Right Arm, Patient Position: Sitting, Cuff Size: Large)   Pulse (!) 52   Ht 5' 9 (1.753 m)   Wt 182 lb (82.6 kg)   SpO2 97%   BMI 26.88 kg/m    Wt Readings from Last 3 Encounters:  09/19/23 182 lb (82.6 kg)  09/04/23 180 lb (81.6 kg)  09/03/23 180 lb (81.6 kg)    GEN: Well nourished, well developed in no acute distress NECK: No JVD; No carotid bruits CARDIAC: RRR, no murmurs, rubs, gallops RESPIRATORY:  Clear to auscultation without rales, wheezing or rhonchi  ABDOMEN: Soft, non-tender, non-distended EXTREMITIES:  No edema; No acute deformity      Assessment and Plan:  Nonobstructive coronary artery disease Dyspnea on exertion Right/left cardiac catheterization 08/2023 showed moderate single-vessel coronary artery disease with sequential 30% and 50-60% mid LAD stenosis that are not hemodynamically significant, minimal luminal irregularities noted in the RCA, normal left and right heart filling pressures Echocardiogram 08/2023 with LVEF 60 to 65%, no RWMA, grade 1 DD, RV normal,  normal PASP - Right radial cath site without hematoma, swelling, and good 2+ radial pulses  - He reports DOE is essentially unchanged.  Denies any chest pain.  He remains euvolemic on exam without orthopnea, PND, leg swelling - He he did have a CT chest 04/2021 showing mild, clustered irregular, ground glass, and centrilobular nodularity in the lower lungs bilaterally, consistent with atypical infection, potentially including atypical mycobacterium which was essentially unchanged from remote CT dated 05/27/2014.  He will reach out to his PCP Dr. Leonel for repeat CT chest for further evaluation of his DOE - Overall no anginal symptoms, reassuring echocardiogram and cardiac catheterization.  There is no indication for further ischemic evaluation at this time - Continue clopidogrel  75 mg daily, rosuvastatin  10 mg daily, and nitroglycerin  as needed  Paroxysmal SVT Bradycardia ZIO 10/2022 showed predominant sinus rhythm with average heart rate 52 bpm with frequent SVT, isolated PACs/PVCs with no high-grade AV block or pauses - Heart rate today is 52 bpm - Stable.  He denies any palpitations, lightheadedness, dizziness, syncope/presyncope - Avoid AV nodal blocking  Hypertension Orthostatic hypotension Blood pressure  today is 148/72 Home blood pressures average between 130s to 140s Unable to tolerate Florinef  due to lower extremity edema.  He self discontinued midodrine  in the past - He denies any recent symptoms suggestive of orthostatic hypotension.  No lightheadedness, dizziness, syncope or presyncope - Given age and history will allow for some permissive hypertension - Can possibly add low-dose amlodipine  if blood pressure remains elevated at follow-up visit - Continue compression stockings.  He is interested in retrying his abdominal binder - Maintain adequate hydration and change positions slowly  Peripheral arterial disease S/p left femoral-tibial bypass in 2014 Lower extremity dopplers on  07/2023 showed patent bypass graft with proximal to distal graft velocity <40 cm/s with a biphasic waveform. ABIs were normal on the left (1.18) and mildly reduce on the right (0.88) - Today he denies any claudication - Managed on clopidogrel  and rosuvastatin  - Followed by vascular surgery  Hyperlipidemia, LDL goal <70 LDL 62 on 02/2023 and well-controlled - Continue rosuvastatin  10 mg daily  CKD stage IIIa Creatinine 1.36 and GFR 50 on 07/2023 - Repeat BMET today      Dispo: Follow-up as scheduled with Callie  Signed, Lum LITTIE Louis, NP

## 2023-09-20 LAB — BASIC METABOLIC PANEL WITH GFR
BUN/Creatinine Ratio: 26 — ABNORMAL HIGH (ref 10–24)
BUN: 34 mg/dL — ABNORMAL HIGH (ref 8–27)
CO2: 24 mmol/L (ref 20–29)
Calcium: 9.7 mg/dL (ref 8.6–10.2)
Chloride: 98 mmol/L (ref 96–106)
Creatinine, Ser: 1.3 mg/dL — ABNORMAL HIGH (ref 0.76–1.27)
Glucose: 88 mg/dL (ref 70–99)
Potassium: 4.7 mmol/L (ref 3.5–5.2)
Sodium: 134 mmol/L (ref 134–144)
eGFR: 53 mL/min/1.73 — ABNORMAL LOW (ref >59)

## 2023-09-22 ENCOUNTER — Ambulatory Visit: Payer: Self-pay | Admitting: Emergency Medicine

## 2023-10-02 ENCOUNTER — Ambulatory Visit (INDEPENDENT_AMBULATORY_CARE_PROVIDER_SITE_OTHER): Admitting: Podiatry

## 2023-10-02 ENCOUNTER — Encounter: Payer: Self-pay | Admitting: Podiatry

## 2023-10-02 DIAGNOSIS — M79672 Pain in left foot: Secondary | ICD-10-CM

## 2023-10-02 DIAGNOSIS — B351 Tinea unguium: Secondary | ICD-10-CM

## 2023-10-02 DIAGNOSIS — M79671 Pain in right foot: Secondary | ICD-10-CM

## 2023-10-02 NOTE — Progress Notes (Signed)
 Patient presents for evaluation and treatment of tenderness and some redness around nails feet.  Tenderness around toes with walking and wearing shoes.  Physical exam:  General appearance: Alert, pleasant, and in no acute distress.  Vascular: Pedal pulses: DP 2/4 B/L, PT 2/4 B/L. Mild edema lower legs bilaterally  Neu  Dermatologic:  Nails thickened, disfigured, discolored 1-5 BL with subungual debris.  Redness and hypertrophic nail folds along nail folds bilaterally but no signs of drainage or infection.  Musculoskeletal:     Diagnosis: 1. Painful onychomycotic nails 1 through 5 bilaterally. 2. Pain toes 1 through 5 bilaterally.  Plan: -Debrided onychomycotic nails 1 through 5 bilaterally.  Sharply debrided nails with nail clipper and reduced with a power bur.  Return 3 months RFC

## 2023-10-03 ENCOUNTER — Encounter: Payer: Self-pay | Admitting: Internal Medicine

## 2023-10-07 DIAGNOSIS — E782 Mixed hyperlipidemia: Secondary | ICD-10-CM | POA: Diagnosis not present

## 2023-10-07 DIAGNOSIS — N1831 Chronic kidney disease, stage 3a: Secondary | ICD-10-CM | POA: Diagnosis not present

## 2023-10-07 DIAGNOSIS — E039 Hypothyroidism, unspecified: Secondary | ICD-10-CM | POA: Diagnosis not present

## 2023-10-07 DIAGNOSIS — M150 Primary generalized (osteo)arthritis: Secondary | ICD-10-CM | POA: Diagnosis not present

## 2023-10-13 ENCOUNTER — Encounter: Payer: Self-pay | Admitting: Surgery

## 2023-10-13 ENCOUNTER — Ambulatory Visit (HOSPITAL_COMMUNITY)
Admission: RE | Admit: 2023-10-13 | Discharge: 2023-10-13 | Disposition: A | Source: Ambulatory Visit | Attending: Surgery | Admitting: Surgery

## 2023-10-13 ENCOUNTER — Ambulatory Visit: Attending: Surgery | Admitting: Surgery

## 2023-10-13 ENCOUNTER — Ambulatory Visit (HOSPITAL_COMMUNITY)
Admission: RE | Admit: 2023-10-13 | Discharge: 2023-10-13 | Disposition: A | Discharge status: Home / Self Care | Source: Ambulatory Visit | Attending: Surgery | Admitting: Surgery

## 2023-10-13 VITALS — BP 185/76 | HR 50 | Temp 98.0°F | Ht 69.0 in | Wt 182.0 lb

## 2023-10-13 DIAGNOSIS — I779 Disorder of arteries and arterioles, unspecified: Secondary | ICD-10-CM | POA: Insufficient documentation

## 2023-10-13 DIAGNOSIS — I70213 Atherosclerosis of native arteries of extremities with intermittent claudication, bilateral legs: Secondary | ICD-10-CM

## 2023-10-13 LAB — VAS US ABI WITH/WO TBI
Left ABI: 1.12
Right ABI: 0.92

## 2023-10-13 NOTE — Progress Notes (Signed)
 [facility] [place]  Patient name: Walter [name] MRN: [id number] DOB: [birth year] Sex: male   REASON FOR VISIT:    Follow up  HISOTRY OF PRESENT ILLNESS:    Walter [name] is a 89 y.o. male who is status post left femoral-popliteal bypass graft with vein on 11/27/2012 for critical limb ischemia with left toe ulceration.  He was found to have low velocities within his graft and so he is back today for repeat studies.   PAST MEDICAL HISTORY:   Past Medical History:  Diagnosis Date   Anemia    pt denies this   Anxiety    Arthritis    legs and feet (06/10/2013)   Barrett's esophagus    never dilated (06/10/2013)   Cataract    Chronic kidney disease    stage 3   Colon polyps    tubular adenoma-last colon 12/18/2005   Depression    Diverticulosis    GERD (gastroesophageal reflux disease)    takes omeprazole    Hematoma    on mid back   Hiatal hernia    High cholesterol    Hypertension    Hypothyroidism    takes synthroid    Peripheral vascular disease      FAMILY HISTORY:   Family History  Problem Relation Age of Onset   Heart disease Mother    Lung cancer Brother        and sister   Melanoma Father    Alzheimer's disease Sister        x 2   Heart disease Sister    Hypertension Sister    Colon cancer Neg Hx    Colon polyps Neg Hx    Rectal cancer Neg Hx    Pancreatic cancer Neg Hx    Stomach cancer Neg Hx    Esophageal cancer Neg Hx     SOCIAL HISTORY:   Social History   Tobacco Use   Smoking status: Former    Current packs/day: 0.00    Average packs/day: 1 pack/day for 20.0 years (20.0 ttl pk-yrs)    Types: Cigarettes    Start date: 06/28/1965    Quit date: 06/28/1985    Years since quitting: 38.3   Smokeless tobacco: Never  Substance Use Topics   Alcohol use: No    Comment: 06/10/2013 drank recreationally when I did drink; stopped ~ 2010     ALLERGIES:   Allergies  Allergen  Reactions   Penicillins Other (See Comments)    Boils     CURRENT MEDICATIONS:   Current Outpatient Medications  Medication Sig Dispense Refill   acetaminophen  (TYLENOL ) 500 MG tablet Take 1,000 mg by mouth at bedtime.     alum & mag hydroxide-simeth (MAALOX/MYLANTA) 200-200-20 MG/5ML suspension Take 15 mLs by mouth daily.     clopidogrel  (PLAVIX ) 75 MG tablet Take 1 tablet (75 mg total) by mouth daily. 15 tablet 0   feeding supplement (BOOST HIGH PROTEIN) LIQD Take 1 Container by mouth 2 (two) times daily between meals.     levothyroxine  (SYNTHROID , LEVOTHROID) 125 MCG tablet Take 125 mcg by mouth at bedtime.     nitroGLYCERIN  (NITROSTAT ) 0.4 MG SL tablet Place 1 tablet (0.4 mg total) under the tongue every 5 (five) minutes as needed for chest pain. 25 tablet 2   Omega 3-6-9 Fatty Acids (OMEGA 3-6-9 COMPLEX PO) Take 2 capsules by mouth daily.     omeprazole  (PRILOSEC) 40 MG capsule Take 1 capsule (40 mg total) by mouth  daily. Office visit for further refills 100 capsule 2   rosuvastatin  (CRESTOR ) 10 MG tablet Take 10 mg by mouth daily.     No current facility-administered medications for this visit.    REVIEW OF SYSTEMS:   [X]  denotes positive finding, [ ]  denotes negative finding Cardiac  Comments:  Chest pain or chest pressure:    Shortness of breath upon exertion:    Short of breath when lying flat:    Irregular heart rhythm:        Vascular    Pain in calf, thigh, or hip brought on by ambulation:    Pain in feet at night that wakes you up from your sleep:     Blood clot in your veins:    Leg swelling:         Pulmonary    Oxygen at home:    Productive cough:     Wheezing:         Neurologic    Sudden weakness in arms or legs:     Sudden numbness in arms or legs:     Sudden onset of difficulty speaking or slurred speech:    Temporary loss of vision in one eye:     Problems with dizziness:         Gastrointestinal    Blood in stool:     Vomited blood:          Genitourinary    Burning when urinating:     Blood in urine:        Psychiatric    Major depression:         Hematologic    Bleeding problems:    Problems with blood clotting too easily:        Skin    Rashes or ulcers:        Constitutional    Fever or chills:      PHYSICAL EXAM:   Vitals:   10/13/23 1355  BP: (!) 185/76  Pulse: (!) 50  Temp: 98 F (36.7 C)  SpO2: 98%  Weight: 182 lb (82.6 kg)  Height: 5' 9 (1.753 m)    GENERAL: The patient is a well-nourished male, in no acute distress. The vital signs are documented above. CARDIAC: There is a regular rate and rhythm.  VASCULAR: palpable pedal pulse on the left PULMONARY: Non-labored respirations MUSCULOSKELETAL: There are no major deformities or cyanosis. NEUROLOGIC: No focal weakness or paresthesias are detected. SKIN: There are no ulcers or rashes noted. PSYCHIATRIC: The patient has a normal affect.  STUDIES:   I have reviewed the following: Left Graft #1: AK to BK popliteal bypass  +--------------------+--------+--------+---------+--------+                     PSV cm/sStenosisWaveform Comments  +--------------------+--------+--------+---------+--------+  Inflow             146             biphasic           +--------------------+--------+--------+---------+--------+  Proximal Anastomosis157             biphasic           +--------------------+--------+--------+---------+--------+  Proximal Graft      63              biphasic           +--------------------+--------+--------+---------+--------+  Mid Graft           71  biphasic           +--------------------+--------+--------+---------+--------+  Distal Graft        54              biphasic           +--------------------+--------+--------+---------+--------+  Distal Anastomosis  58              triphasic          +--------------------+--------+--------+---------+--------+  Outflow             46              biphasic           +--------------------+--------+--------+---------+--------+  Summary:  Left: Patent graft without evidence of significant stenosis.  +-------+-----------+-----------+------------+------------+  ABI/TBIToday's ABIToday's TBIPrevious ABIPrevious TBI  +-------+-----------+-----------+------------+------------+  Right 0.92       0.76       0.88        0.62          +-------+-----------+-----------+------------+------------+  Left  1.12       1.01       1.84        0.82          +-------+-----------+-----------+------------+------------+      MEDICAL ISSUES:   PAD:  bypass is widely patent without low velocities.  Plan for follow up I a year    Malvina New, IV, MD, FACS [facility] [phone] Pager [phone]

## 2023-11-07 NOTE — Progress Notes (Signed)
 Cardiology Office Note:    Date:  11/19/2023   ID:  Walter [name], DOB [birth year], MRN [id number]  PCP:  Leonel Cole, MD  Cardiologist:  Stanly DELENA Leavens, MD     Referring MD: Leonel Cole, MD   Chief Complaint: follow-up of CAD and orthostatic hypotension  History of Present Illness:    Walter [name] is a 89 y.o. male with a history of moderate non-obstructive CAD on cardiac catheterization in 08/2023, paroxysmal SVT,  bradycardia, PAD s/p left femoral-tibial bypass in 2014, hypertension complicated by orthostatic hypotension, hyperlipidemia, hypothyroidism, GERD with Barrett's esophagus, CKD stage IIIa, and anxiety/ depression who is followed by Dr. Leavens and presents today for routine follow-up.   Remote cardiac catheterization in 2010 showed 30% stenosis of proximal LAD and otherwise clean coronaries. He was previously seen by Dr. Dann in 03/2014 for PAD. Peripheral angiogram in 2014 showed severe right distal SFA disease and an occluded left popliteal artery as well as bilateral tibial disease. He underwent a left femoral-tibial bypass in 11/2012. His PAD is now followed by Vascular Surgery.   Patient was admitted in 10/2022 for near syncope. He was noted to be orthostatic at that time. However, he was also noted to be bradycardic so there was concern for symptomatic bradycardia. Echo showed LVEF of 55-60% with normal wall motion and grade 1 diastolic dysfunction, normal RV size and function, and mild MR. Outpatient Zio monitor was ordered and showed predominant sinus rhythm with average heart rate of 52 bpm with frequent SVT (most runs were 5-6 beats long but long run was 24 seconds) and isolated PACs/ PVCs but no high grade AV block or pauses. He was started on Florinef  at outpatient follow-up, but this was subsequently stopped and was started on Midodrine  instead. He ultimately stopped this on his own as well with significant improvement in his symptoms.   He was seen  by me in 07/2023 and reported significant new dyspnea on exertion over the last few months and had new T wave inversions on EKG. There was concern that his dyspnea was an anginal equivalent; therefore, R/ LHC was ordered. However, insurance denied this so coronary CTA was ordered and showed a coronary calcium  score of 586 (51st percentile and age) and TPV 63 (29th percentile for age and sex) with moderate (50-69%) stenosis of ostial to mid LAD. FFR was positive. This led to a R/ LHC in 08/2023 which showed moderate single vessel CAD with 55% stenosis of mid LAD and minimal luminal irregularities noted in the RCA as well as normal left and right heart pressures, borderline elevated pulmonary artery pressure, and normal cardiac output.  Echo in 08/2023 showed LVEF of 60-65% with normal wall motion and grade 1 diastolic dysfunction, normal RV function, and mild MR.  He was last seen by Lum Louis, NP, in 09/2023 at which time he continued to report some shortness of breath but this was stable.   Patient presents today for follow-up. He states his main issue his back/ hip pain which is a chronic issues. However, from a cardiac standpoint, he feels like he is doing well. He has chronic dyspnea on exertion but states this is stable. He denies any shortness of breath at rest. No chest pain, orthopnea, PND, palpitations, lightheadedness/ dizziness, or syncope. He states he started taking Super Beets from [place] about one month ago and states his BP and lower extremity in his left leg have gone down with this but his heart rate has gone  up. However, he does not have any real palpitations with this. EKG today shows that he is in new onset atrial fibrillation. He denies any abnormal bleeding issues.   EKGs/Labs/Other Studies Reviewed:    The following studies were reviewed:  Monitor 10/17/2022 to 10/31/2022:   Patient had a minimum heart rate of 39 bpm, maximum heart rate of 175 bpm, and average heart rate of 52  bpm.   Predominant underlying rhythm was sinus rhythm.   Frequent SVT, most 5-6 beats but longest 24 seconds   Isolated PACs were rare (<1.0%).   Isolated PVCs were rare (<1.0%).   No high grade AV block.   Triggered and diary events associated with sinus rhythm.   Asymptomatic SVT. _______________  Coronary CTA 08/04/2023: Summary: 1. Coronary calcium  score of 586. This was 51st percentile for age-, sex, and race-matched controls. 2. Total plaque volume 533 mm3 which is 29th percentile for age- and sex-matched controls (calcified plaque 18mm3; non-calcified plaque 422mm3). TPV is severe. 3. Normal coronary origin with right dominance. 4. Moderate atherosclerosis: 50-69% ostial to mid LAD 5.  This study has been submitted for FFR analysis of the LAD.   FFR Summary: 1. Coronary CTA FFR analysis demonstrates a possible hemodynamically flow limiting lesion in the mid LAD (FFR 0.94>>0.79>>0.71). 2. Recommend cardiac catheterization. _______________   Echocardiogram 08/20/2023: Impressions: 1. Left ventricular ejection fraction, by estimation, is 60 to 65%. The  left ventricle has normal function. The left ventricle has no regional  wall motion abnormalities. Left ventricular diastolic parameters are  consistent with Grade I diastolic  dysfunction (impaired relaxation).   2. Right ventricular systolic function is normal. The right ventricular  size is normal. There is normal pulmonary artery systolic pressure.   3. Left atrial size was mildly dilated.   4. The mitral valve is normal in structure. Mild mitral valve  regurgitation. No evidence of mitral stenosis.   5. The aortic valve is tricuspid. There is mild calcification of the  aortic valve. There is mild thickening of the aortic valve. Aortic valve  regurgitation is not visualized. Aortic valve sclerosis/calcification is  present, without any evidence of  aortic stenosis.   6. The inferior vena cava is normal in size with  greater than 50%  respiratory variability, suggesting right atrial pressure of 3 mmHg.  _______________  Right/ Left Cardiac Catheterization 09/04/2023: Conclusions: Moderate single vessel coronary artery disease with sequential 30% and 50-60% mid LAD stenoses that are not hemodynamically significant (RFR = 0.91).  Minimal luminal irregularities noted in the RCA. Normal left and right heart filling pressures. Borderline elevated pulmonary artery pressure. Normal Fick cardiac output/index.   Recommendations: Continue medical therapy and risk factor modification to prevent progression of moderate coronary artery disease.  Diagnostic Dominance: Right     EKG:  EKG ordered today.   EKG Interpretation Date/Time:  Wednesday November 19 2023 16:07:12 EST Ventricular Rate:  67 PR Interval:    QRS Duration:  86 QT Interval:  384 QTC Calculation: 405 R Axis:   28  Text Interpretation: Atrial fibrillation When compared with ECG of 04-Sep-2023 10:28, Atrial fibrillation has replaced Sinus rhythm Confirmed by Nakul Avino [phone]) on 11/19/2023 5:46:42 PM    Recent Labs: 07/15/2023: Platelets 190 09/04/2023: Hemoglobin 11.9 09/19/2023: BUN 34; Creatinine, Ser 1.30; Potassium 4.7; Sodium 134  Recent Lipid Panel    Component Value Date/Time   CHOL (H) 03/15/2008 0445    207        ATP III CLASSIFICATION:  <200  mg/dL   Desirable  799-760  mg/dL   Borderline High  >=759    mg/dL   High          TRIG 835 (H) 03/15/2008 0445   HDL 31 (L) 03/15/2008 0445   CHOLHDL 6.7 03/15/2008 0445   VLDL 33 03/15/2008 0445   LDLCALC (H) 03/15/2008 0445    143        Total Cholesterol/HDL:CHD Risk Coronary Heart Disease Risk Table                     Men   Women  1/2 Average Risk   3.4   3.3  Average Risk       5.0   4.4  2 X Average Risk   9.6   7.1  3 X Average Risk  23.4   11.0        Use the calculated Patient Ratio above and the CHD Risk Table to determine the patient's CHD Risk.         ATP III CLASSIFICATION (LDL):  <100     mg/dL   Optimal  899-870  mg/dL   Near or Above                    Optimal  130-159  mg/dL   Borderline  839-810  mg/dL   High  >809     mg/dL   Very High    Physical Exam:    Vital Signs: BP 136/78   Pulse 73   Ht 5' 9 (1.753 m)   Wt 183 lb 6.4 oz (83.2 kg)   SpO2 98%   BMI 27.08 kg/m     Wt Readings from Last 3 Encounters:  11/19/23 183 lb 6.4 oz (83.2 kg)  10/13/23 182 lb (82.6 kg)  09/19/23 182 lb (82.6 kg)     General: 89 y.o. Caucasian male in no acute distress. HEENT: Normocephalic and atraumatic. Sclera clear.  Neck: Supple. No JVD. Heart: Irregularly irregular rhythm with normal rate.   No murmurs, gallops, or rubs.  Lungs: No increased work of breathing. Clear to ausculation bilaterally. No wheezes, rhonchi, or rales.  Extremities: Trace edema of left lower extremity (chronic). Skin: Warm and dry. Neuro: No focal deficits. Psych: Normal affect. Responds appropriately.   Assessment:    1. Coronary artery disease involving native coronary artery of native heart without angina pectoris   2. Bradycardia   3. Paroxysmal SVT (supraventricular tachycardia)   4. PAD (peripheral artery disease)   5. Primary hypertension   6. Orthostatic hypotension   7. Hyperlipidemia, unspecified hyperlipidemia type   8. Stage 3a chronic kidney disease (HCC)   9. Atrial fibrillation, unspecified type [facility])     Plan:    New Onset Atrial Fibrillation Bradycardia Paroxysmal SVT Patient has a history of bradycardia and paroxysmal SVT. Monitor in 10/2022 showed predominant sinus rhythm with average heart rate of 52 bpm with frequent SVT (most runs were 5-6 beats long but long run was 24 seconds) and isolated PACs/ PVCs but no high grade AV block or pauses.  - EKG today shows new onset atrial fibrillation with rates in the 60s.  - He is asymptomatic with this. No palpitations. - Recent Echo in 08/2023 showed normal LV function. - No  AV nodal agents given history of bradycardia and orthostatic hypotension. - Will check CBC, BMET, Mag, TSH.  - CHA2DS2-VASc = 4 (CAD/ PAD, HTN, age x2). He does report some unsteadiness on his  feet but ambulates with a cane and denies any frequent falls. I think the benefits of anticoagulation outweigh the risks. Will stop Plavix  and start Eliquis 5mg  twice daily.  - Will order 1 week Zio monitor to assess for burden. If he is in persistent atrial fibrillation, patient is willing to try a DCCV but would prefer to avoid anti-arrhymic. Will see him back in about 3-4 weeks. If monitor shows continuous atrial fibrillation and he is still in this at follow-up visit, can arrange a DCCV.   Dyspnea on Exertion  Patient had had dyspnea on exertion of the last few months. Cardiac work-up was unremarkable. R/ LHC in 08/2023  showed moderate non-obstructive disease with normal left and right heart pressures, borderline elevated pulmonary artery pressure, and normal cardiac output.  Echo showed LVEF of 60-65% with grade 1 diastolic dysfunction as well as normal RV function.  - He continues to have dyspnea on exertion but this is stable. - No additional cardiac work-up necessary.  Moderate Non-Obstructive CAD Noted on  recent cardiac catheterization in 08/2023. - No chest pain.  - Currently on Plavix  for PAD but will stop this given need for Eliquis with new onset atrial fibrillation. - Continue Crestor  10mg  daily.     PAD S/p left femoral-tibial bypass in 2014. Recent lower extremity dopplers in 10/2023 showed patent graft without evidence of significant stenosis.  ABIs were normal on the left (1.12) and mildly reduced on the right (0.92). - Currently on Plavix . Will stop this given new need for Eliquis with atrial fibrillation.  - Continue Crestor  10mg  daily. - Followed by Vascular Surgery.  Of note, patient states it was his prior Cardiologist not Vascular Surgery who put him on Plavix  in the past. He  states he has previously discussed Plavix  with Vascular Surgery and they reportedly said that they would not have put him on this. Their most recent notes do not mention need for Plavix . Therefore, I feel safe stopping Plavix  now that he is going to be on Eliquis.    Orthostatic Hypotension Hypertension Patient has a history of hypertension complicated by symptomatic orthostatic hypotension.  - BP well controlled.   - Unable to tolerate Florinef  due to lower extremity edema. He was previously on Midodrine  but stopped this on his own several months ago and has remained asymptomatic.   Hyperlipidemia Lipid panel in 08/2023: Total Cholesterol 126, Triglycerides 91, HLD 49, LDL 60. LDL goal <70 given PAD.  - Continue Crestor  10mg  daily.   CKD Stage IIIa Baseline creatinine around 1.2 to 1.3.  - Will repeat BMET today.  Disposition: Follow up in 3-4 weeks.   Signed, Aline FORBES Door, PA-C  11/19/2023 5:23 PM     [facility]

## 2023-11-10 ENCOUNTER — Encounter: Payer: Self-pay | Admitting: Radiology

## 2023-11-12 IMAGING — CT CT CHEST HIGH RESOLUTION
1 of 4 series · 14 of 33 positions shown, 18 images · non-contrast
Comparison: CT abdomen pelvis, 05/27/2014

CLINICAL DATA: Community acquired pneumonia



[Series 8: super d · axial · 0.76mm/px · z∈[-98,+176]mm · 14 of 510 slices shown, 18 images]
[im 27/510  mediastinal]
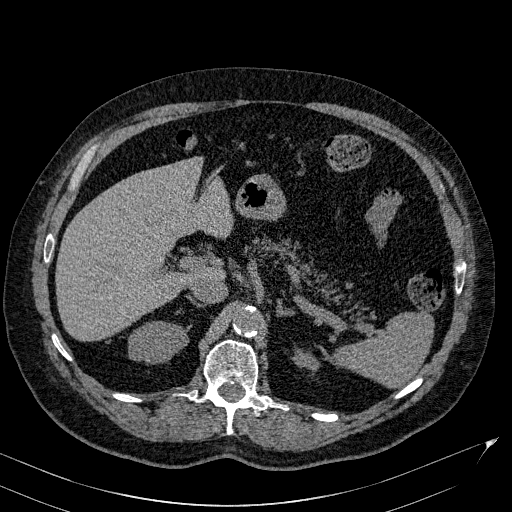
[im 27/510  lung]
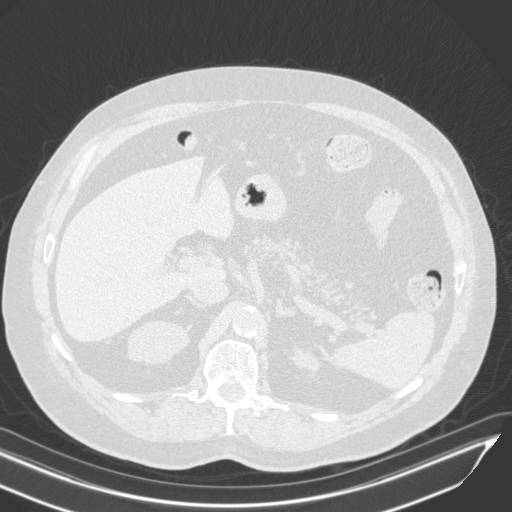
[im 81/510  lung]
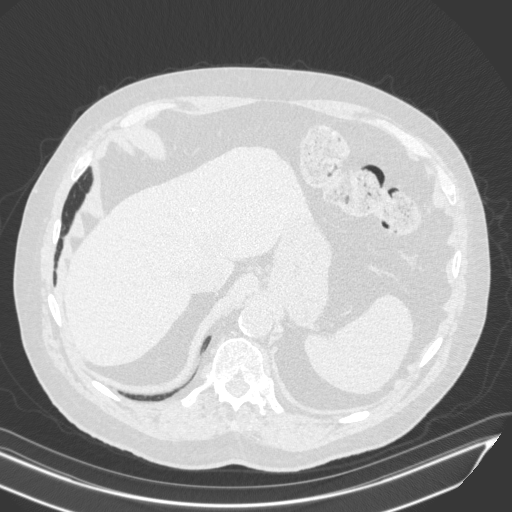
[im 108/510  lung]
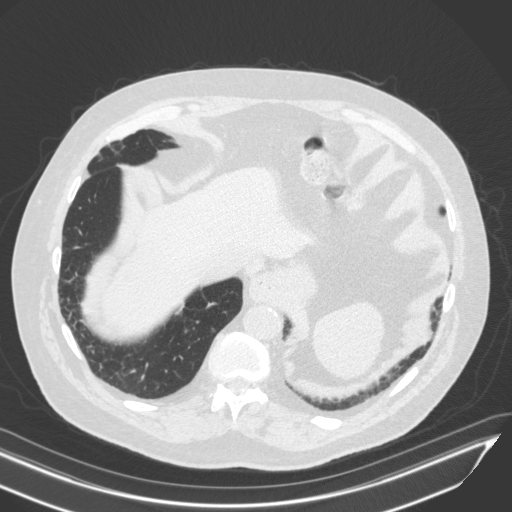
[im 161/510  lung]
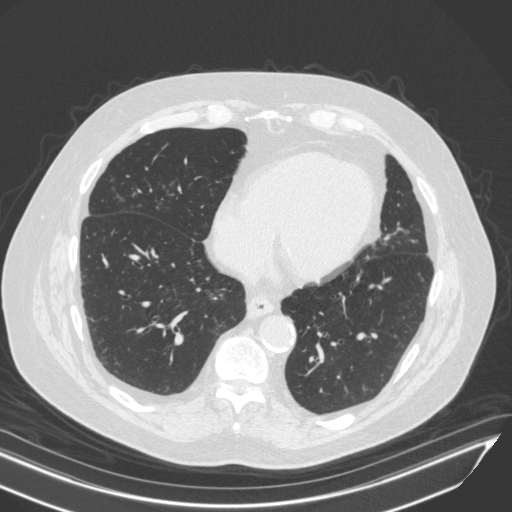
[im 188/510  mediastinal]
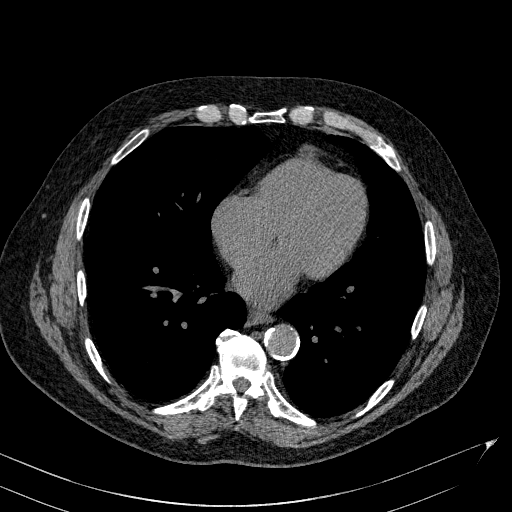
[im 188/510  lung]
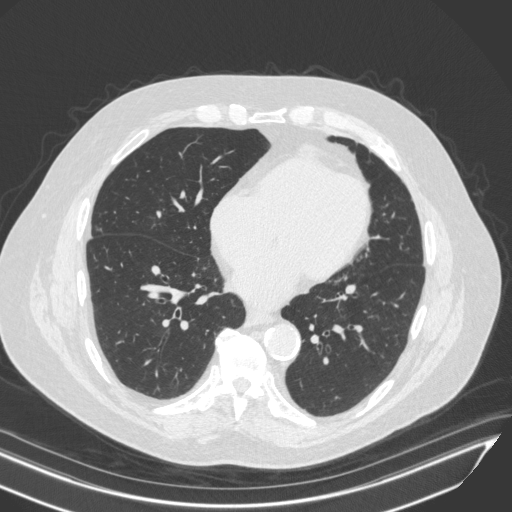
[im 215/510  lung]
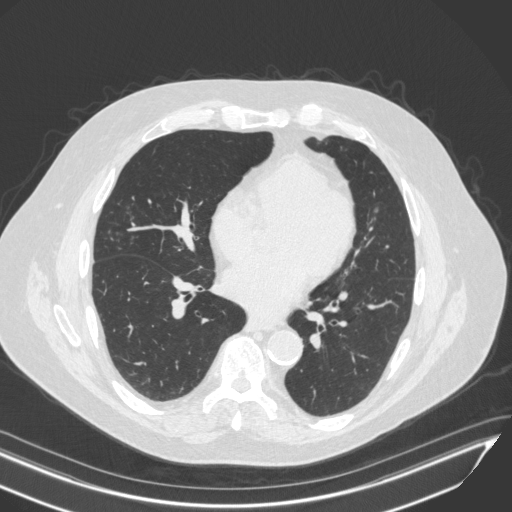
[im 242/510  lung]
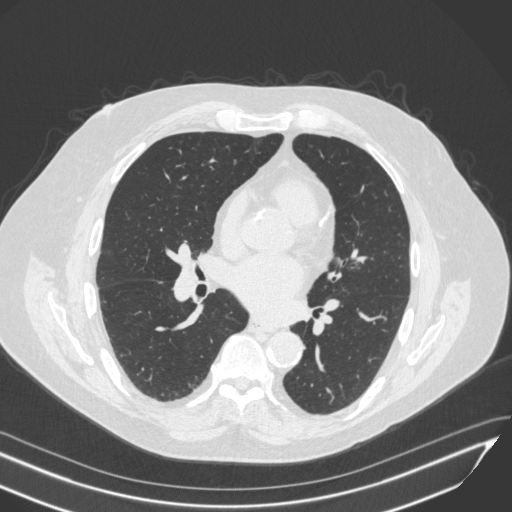
[im 255/510  lung]
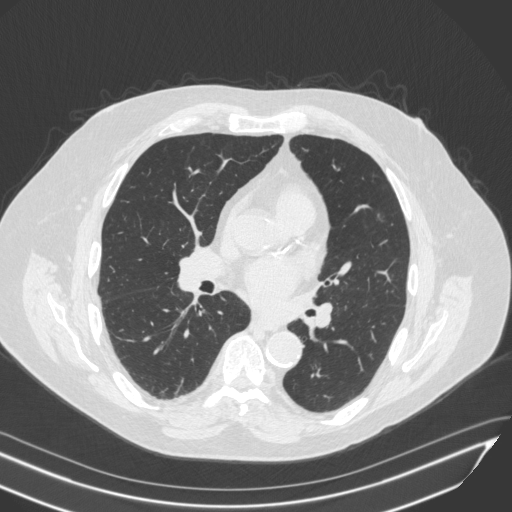
[im 295/510  mediastinal]
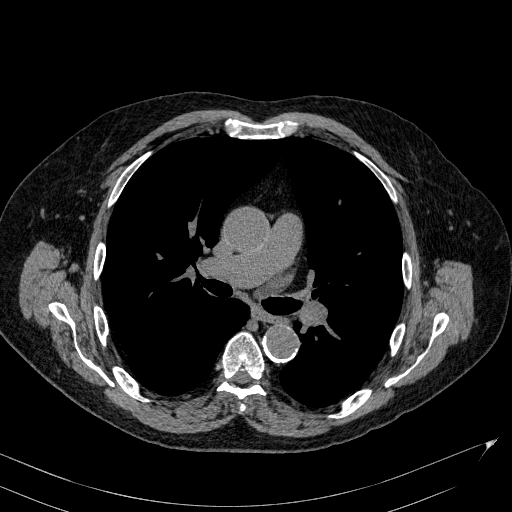
[im 295/510  lung]
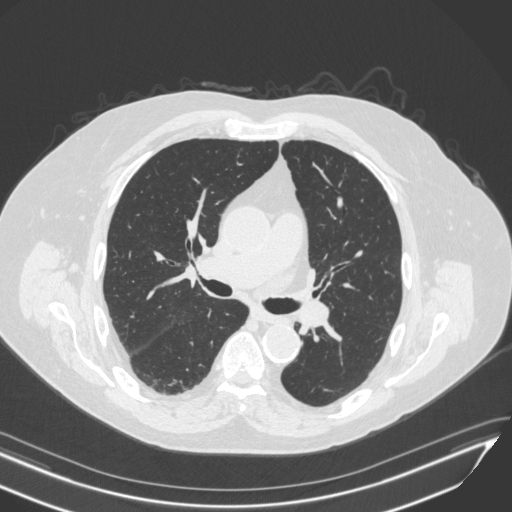
[im 322/510  lung]
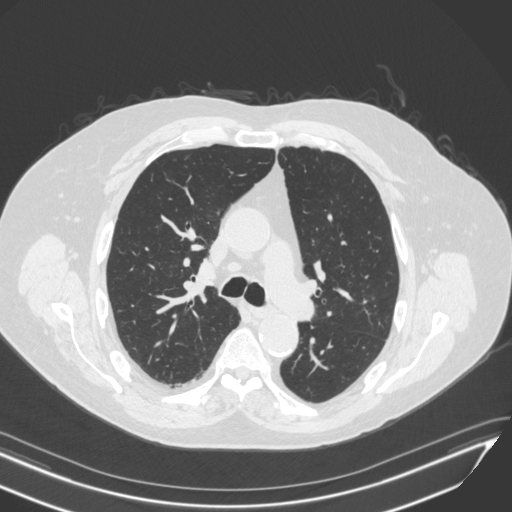
[im 376/510  lung]
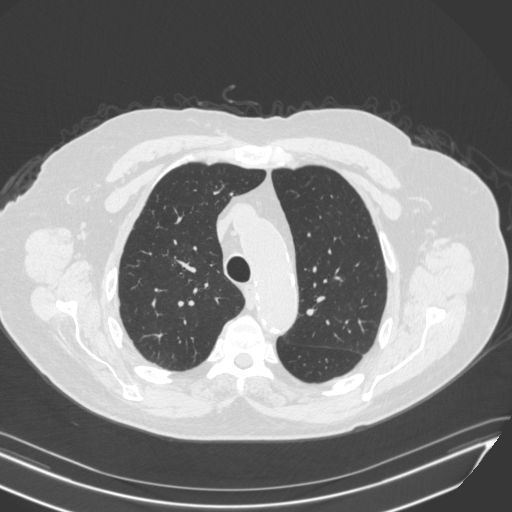
[im 402/510  lung]
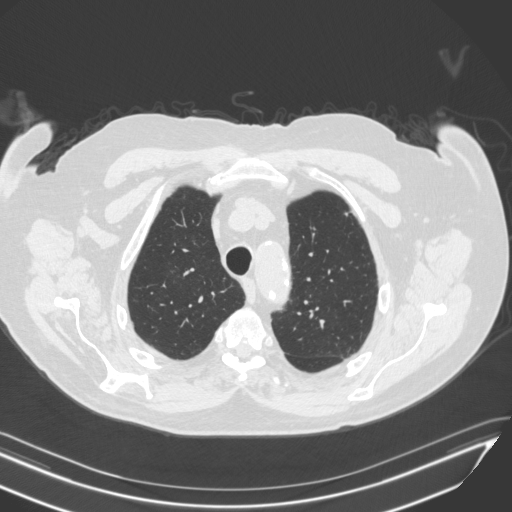
[im 429/510  mediastinal]
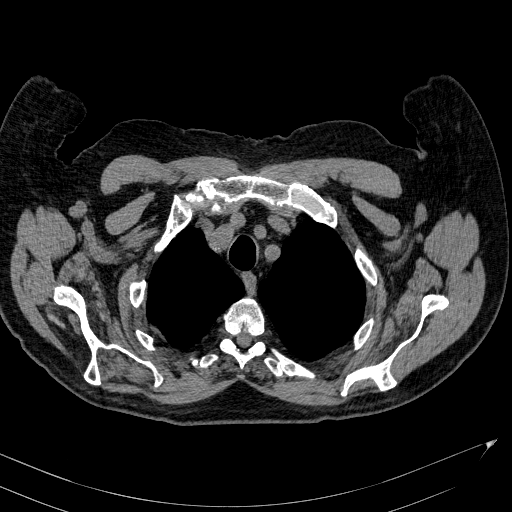
[im 429/510  lung]
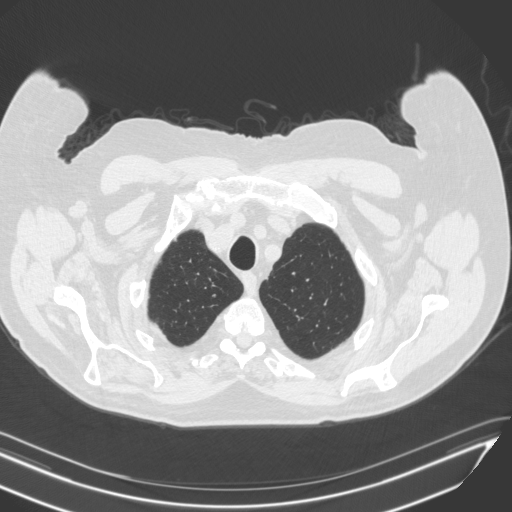
[im 483/510  lung]
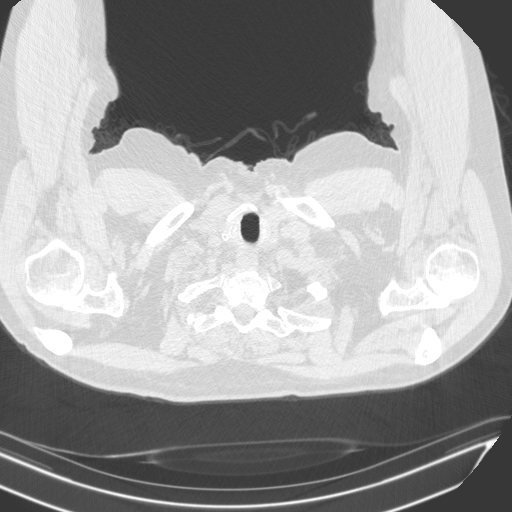

[14 of 33 positions shown; findings below may reference images not displayed]

FINDINGS: Cardiovascular: Aortic atherosclerosis. Normal heart size.
Three-vessel coronary artery calcifications. No pericardial
effusion.

Mediastinum/Nodes: No enlarged mediastinal, hilar, or axillary lymph
nodes. Thyroid gland, trachea, and esophagus demonstrate no
significant findings.

Lungs/Pleura: Mild, clustered irregular, ground-glass, and
centrilobular nodularity in the lower lungs bilaterally. Mild
fibrotic septal thickening and subpleural bronchiolectasis in the
dependent bilateral lung bases (series 10, image 239). No
significant air trapping on expiratory phase imaging. No pleural
effusion or pneumothorax.

Upper Abdomen: No acute abnormality.

Musculoskeletal: No chest wall abnormality. No suspicious osseous
lesions identified.
IMPRESSION: 1. Mild, clustered irregular, ground-glass, and centrilobular
nodularity in the lower lungs bilaterally, consistent with atypical
infection, potentially including atypical mycobacterium.
2. Mild fibrotic septal thickening and subpleural bronchiolectasis
in the dependent bilateral lung bases. This is not significantly
changed in comparison to relatively remote CT dated 05/27/2014, and
most likely reflects mild post infectious or inflammatory scarring.
3. Coronary artery disease.

Aortic Atherosclerosis ([id number]).

## 2023-11-19 ENCOUNTER — Encounter: Payer: Self-pay | Admitting: Student

## 2023-11-19 ENCOUNTER — Ambulatory Visit: Attending: Student in an Organized Health Care Education/Training Program | Admitting: Student

## 2023-11-19 ENCOUNTER — Ambulatory Visit

## 2023-11-19 VITALS — BP 136/78 | HR 73 | Ht 69.0 in | Wt 183.4 lb

## 2023-11-19 DIAGNOSIS — I4891 Unspecified atrial fibrillation: Secondary | ICD-10-CM

## 2023-11-19 DIAGNOSIS — R001 Bradycardia, unspecified: Secondary | ICD-10-CM | POA: Diagnosis not present

## 2023-11-19 DIAGNOSIS — I951 Orthostatic hypotension: Secondary | ICD-10-CM

## 2023-11-19 DIAGNOSIS — I739 Peripheral vascular disease, unspecified: Secondary | ICD-10-CM

## 2023-11-19 DIAGNOSIS — I471 Supraventricular tachycardia, unspecified: Secondary | ICD-10-CM | POA: Diagnosis not present

## 2023-11-19 DIAGNOSIS — N1831 Chronic kidney disease, stage 3a: Secondary | ICD-10-CM

## 2023-11-19 DIAGNOSIS — E785 Hyperlipidemia, unspecified: Secondary | ICD-10-CM

## 2023-11-19 DIAGNOSIS — I10 Essential (primary) hypertension: Secondary | ICD-10-CM

## 2023-11-19 DIAGNOSIS — I2510 Atherosclerotic heart disease of native coronary artery without angina pectoris: Secondary | ICD-10-CM | POA: Diagnosis not present

## 2023-11-19 MED ORDER — APIXABAN 2.5 MG PO TABS: 5.0000 mg | ORAL_TABLET | Freq: Two times a day (BID) | ORAL | 3 refills | Status: DC

## 2023-11-19 MED ORDER — APIXABAN 5 MG PO TABS: 5.0000 mg | ORAL_TABLET | Freq: Two times a day (BID) | ORAL | 1 refills | Status: DC

## 2023-11-19 NOTE — Patient Instructions (Addendum)
 Thank you for choosing [facility]!     Medication Instructions:  Start Eliquis 5 mg. Take one tablet twice tablets daily.   Stop the clopidogrel  (PLAVIX ) 75 MG tablet   *If you need a refill on your cardiac medications before your next appointment, please call your pharmacy*   Lab Work: CBC, BMET, TSH, MAG If you have labs (blood work) drawn today and your tests are completely normal, you will receive your results only by: MyChart Message (if you have MyChart) OR A paper copy in the mail If you have any lab test that is abnormal or we need to change your treatment, we will call you to review the results.    ZIO XT- Long Term Monitor Instructions   Your physician has requested you wear your ZIO patch monitor 7 days.   This is a single patch monitor.  [employer] supplies one patch monitor per enrollment.  Additional stickers are not available.   Please do not apply patch if you will be having a Nuclear Stress Test, Echocardiogram, Cardiac CT, MRI, or Chest Xray during the time frame you would be wearing the monitor. The patch cannot be worn during these tests.  You cannot remove and re-apply the ZIO XT patch monitor.   Your ZIO patch monitor will be sent USPS Priority mail from [facility] directly to your home address. The monitor may also be mailed to a PO BOX if home delivery is not available.   It may take 3-5 days to receive your monitor after you have been enrolled.   Once you have received you monitor, please review enclosed instructions.  Your monitor has already been registered assigning a specific monitor serial # to you.   Applying the monitor   Shave hair from upper left chest.   Hold abrader disc by orange tab.  Rub abrader in 40 strokes over left upper chest as indicated in your monitor instructions.   Clean area with 4 enclosed alcohol pads .  Use all pads to assure are is cleaned thoroughly.  Let dry.   Apply patch as indicated in monitor  instructions.  Patch will be place under collarbone on left side of chest with arrow pointing upward.   Rub patch adhesive wings for 2 minutes.Remove white label marked 1.  Remove white label marked 2.  Rub patch adhesive wings for 2 additional minutes.   While looking in a mirror, press and release button in center of patch.  A small green light will flash 3-4 times .  This will be your only indicator the monitor has been turned on.     Do not shower for the first 24 hours.  You may shower after the first 24 hours.   Press button if you feel a symptom. You will hear a small click.  Record Date, Time and Symptom in the Patient Log Book.   When you are ready to remove patch, follow instructions on last 2 pages of Patient Log Book.  Stick patch monitor onto last page of Patient Log Book.   Place Patient Log Book in [place] box.  Use locking tab on box and tape box closed securely.  The [employer] has [employer] on it.  Please place in mailbox as soon as possible.  Your physician should have your test results approximately 7 days after the monitor has been mailed back to [facility].   Call [facility] Customer Care at [phone] if you have questions regarding your ZIO XT  patch monitor.  Call them immediately if you see an orange light blinking on your monitor.   If your monitor falls off in less than 4 days contact our Monitor department at [phone].  If your monitor becomes loose or falls off after 4 days call [employer] at [phone] for suggestions on securing your monitor.       Provider:   Aline Door, PA-C           Follow-Up: At [facility], you and your health needs are our priority.  As part of our continuing mission to provide you with exceptional heart care, we have created designated Provider Care Teams.  These Care Teams include your primary Cardiologist (physician) and Advanced Practice Providers (APPs -  Physician Assistants and  Nurse Practitioners) who all work together to provide you with the care you need, when you need it. We recommend signing up for the patient portal called MyChart.  Sign up information is provided on this After Visit Summary.  MyChart is used to connect with patients for Virtual Visits (Telemedicine).  Patients are able to view lab/test results, encounter notes, upcoming appointments, etc.  Non-urgent messages can be sent to your provider as well.   To learn more about what you can do with MyChart, go to [email or web address].

## 2023-11-19 NOTE — Progress Notes (Unsigned)
 Enrolled for [employer] to mail a ZIO XT long term holter monitor to the patients address on file.

## 2023-11-20 ENCOUNTER — Telehealth: Payer: Self-pay

## 2023-11-20 ENCOUNTER — Telehealth: Payer: Self-pay | Admitting: Pharmacy Technician

## 2023-11-20 ENCOUNTER — Telehealth: Payer: Self-pay | Admitting: Student

## 2023-11-20 ENCOUNTER — Other Ambulatory Visit: Payer: Self-pay

## 2023-11-20 ENCOUNTER — Other Ambulatory Visit (HOSPITAL_COMMUNITY): Payer: Self-pay

## 2023-11-20 MED ORDER — APIXABAN 5 MG PO TABS: 5.0000 mg | ORAL_TABLET | Freq: Two times a day (BID) | ORAL | 0 refills | Status: DC

## 2023-11-20 NOTE — Telephone Encounter (Signed)
 Called and spoke with patient.  He expressed that he does not want a lot of additional workup given all the studies/procedures that he has had over the last few months and his advanced age.  I understand this and I am okay with holding off on the ZIO monitor and potential cardioversion.  However, I explained my concern with him not being on anticoagulation and his stroke risk. His CHA2DS2-VASc score is 4 which indicates he has a 7.3% annual risk of having a stroke.  I explained this to patient.  He would like to speak with his son before making a final decision.  I am not in the office tomorrow.  However, I am on call in the hospital the following day (11/22/2023) and I can call him then to follow-up.  He asked that I call him back after 2 PM on 11/22/2023 which I will do.   Lakeena Downie E Trypp Heckmann, PA-C 11/20/2023 5:42 PM

## 2023-11-20 NOTE — Telephone Encounter (Signed)
 his eliquis is 573.59 for 30 days. Xarelto would be the same amount. His insurance doesn't want to pay for pradaxa brand nor generic. They said eliquis/xarelto is preferred unless he has an allergy to them. He does not qualify for a grant nor assistance through [employer]. His copay would be 47.00 after deductible met. He can get generic pradaxa on goodrx at [facility] for 56.66 for 30 days. We don't take goodrx nor does his mailorder nor does [place] [facility]. [place] [facility] and [place] said they have a discount program since they do not take goodrx and the price of dabigatran would be 94.74 for 30 days.

## 2023-11-20 NOTE — Telephone Encounter (Signed)
 Spoke with patient who reports he is cancelling his follow up appt, his heart monitor and is not going to take Eliquis d/t cost.  Advised pt of the increased risk of stroke d/t At Fib and not being on anticoagulation.  He reports will just continue to take plavix  and if he has a stroke he will have to deal with it but can not afford the cost of the Eliquis.  Advised there are pt assistance programs we can assist him with but he reports he does not feel like it is worth trying for the assistance and does not want to take the medication.  Advised I will forward this to Callie Goodrich for her knowledge.

## 2023-11-20 NOTE — Telephone Encounter (Signed)
 Pt called to cancel his 12/8 appt with C. Jadine. Pt states he cannot afford the blood thinner he was considering taking. Pt would also like to cancel the heart monitor discuss at last appt. Please advise.

## 2023-11-20 NOTE — Telephone Encounter (Signed)
 Hi, his eliquis is 573.59 for 30 days. Xarelto would be the same amount. His insurance doesn't want to pay for pradaxa brand nor generic. They said eliquis/xarelto is preferred unless he has an allergy to them. He does not qualify for a grant nor assistance through [employer]. His copay would be 47.00 after deductible met. He can get generic pradaxa on goodrx at [facility] for 56.66 for 30 days. We don't take goodrx nor does his mailorder nor does [place] [facility]. [place] [facility] and they said they have a discount program since they do not take goodrx and the price of dabigatran would be 94.74 for 30 days.   I called and spoke to the patient to ask if all was in agreement if he would like the pradaxa sent to [facility] or [facility] and gave him the prices of eliquis/xarelto/pradaxa. He said he would like to just drop everything and not take the eliquis/pradaxa. He said he wishes he would of said that at the visit. He said he feels like he doesn't need it. He asked me to let you know.

## 2023-11-20 NOTE — Telephone Encounter (Signed)
 Patient Advocate Encounter   The patient was approved for a [employer] grant that will help cover the cost of ELIQUIS Total amount awarded, $7,500.  Effective: 10/21/2023 - 10/19/24   APW:[id number] ERW:[id number] Hmnle:[id number] PI:[id number]  Ileana Lehmann, CPhT  Pharmacy Patient Advocate Specialist  Direct Number: [phone] Fax: [phone]

## 2023-11-21 ENCOUNTER — Other Ambulatory Visit: Payer: Self-pay | Admitting: Student

## 2023-11-21 NOTE — Telephone Encounter (Signed)
 Awesome. That means he won't have to pay anything right?  I am on call tomorrow in the hospital so patient asked me to call him back tomorrow afternoon.   Thank you so much for your help!  ~Oluwatoyin Banales

## 2023-11-24 ENCOUNTER — Other Ambulatory Visit (HOSPITAL_COMMUNITY): Payer: Self-pay

## 2023-11-24 ENCOUNTER — Other Ambulatory Visit: Payer: Self-pay

## 2023-11-24 MED ORDER — APIXABAN 5 MG PO TABS
5.0000 mg | ORAL_TABLET | Freq: Two times a day (BID) | ORAL | 3 refills | Status: AC
  Filled 2023-11-24 (×2): qty 180, 90d supply, fill #0

## 2023-11-24 NOTE — Telephone Encounter (Signed)
 I called and spoke with patient on evening of 11/22/2023. Informed patient that our Pharmacy team was able to get him approved for the [facility] grant so the cost of Eliquis for him will be $0. He was agreeable to starting Eliquis. Advised patient that prescription should already be at his Pharmacy.   Here is the information regarding the grant that I received from Pharmacy team: Patient Advocate Encounter The patient was approved for a [employer] grant that will help cover the cost of ELIQUIS Total amount awarded, $7,500.  Effective: 10/21/2023 - 10/19/24   APW:[id number] ERW:[id number] Hmnle:[id number] PI:[id number]   Ileana Lehmann, CPhT  Pharmacy Patient Advocate Specialist  Direct Number: [phone] Fax: [phone]      He would like to hold off on the Zio monitor and does not want to pursue a cardioversion right now even if he is in persistent atrial fibrillation. Therefore, he cancelled his follow-up visit in 12/2023. I rescheduled him a follow-up visit with Dr. Santo on 03/08/2024.   Anitra, can you please call the patient and make sure he was able to pick up the Eliquis? Also, if we have not already done so, can we go ahead and send in a 90 day prescription to his mail order pharmacy? Please remind patient that he can stop his Plavix  once he is on Eliquis. He also requested a letter be sent to him reminding him of the appointment date in 03/2024 with Dr. Santo. Can you please help with this as well?  Thanks so much! Trinka Keshishyan

## 2023-11-24 NOTE — Telephone Encounter (Signed)
  Patient said he called his pharmacy to get her eliquis but the pharmacy said they haven't heard anything from us  about his grant. He doesn't know how he will get his eliquis

## 2023-11-24 NOTE — Telephone Encounter (Signed)
 Walter [name]. Yes ma'am, that is fine. I will send in the prescription now to our [facility]. Do you mind just letting the patient know?  Thanks so much! Quinlan Vollmer

## 2023-11-25 ENCOUNTER — Other Ambulatory Visit (HOSPITAL_COMMUNITY): Payer: Self-pay

## 2023-11-25 NOTE — Telephone Encounter (Signed)
 Medication set for mail for free through  [facility] -pt aware

## 2023-12-09 ENCOUNTER — Encounter: Payer: Self-pay | Admitting: Internal Medicine

## 2023-12-09 ENCOUNTER — Ambulatory Visit: Admitting: Internal Medicine

## 2023-12-09 VITALS — BP 126/70 | HR 78 | Ht 69.0 in | Wt 185.0 lb

## 2023-12-09 DIAGNOSIS — K2270 Barrett's esophagus without dysplasia: Secondary | ICD-10-CM

## 2023-12-09 DIAGNOSIS — Z860100 Personal history of colon polyps, unspecified: Secondary | ICD-10-CM

## 2023-12-09 DIAGNOSIS — K219 Gastro-esophageal reflux disease without esophagitis: Secondary | ICD-10-CM | POA: Diagnosis not present

## 2023-12-09 MED ORDER — OMEPRAZOLE 40 MG PO CPDR: 40.0000 mg | DELAYED_RELEASE_CAPSULE | Freq: Every day | ORAL | 3 refills | Status: AC

## 2023-12-09 NOTE — Progress Notes (Signed)
 HISTORY OF PRESENT ILLNESS:  Walter [name] is a 89 y.o. male with past medical history as listed below who has been followed in this office for GERD complicated by Barrett's esophagus, functional abdominal complaints, and adenomatous colon polyps.  He was last seen here October 19, 2019 regarding chronic GERD and constipation.  See that dictation.  At that time he was continued on omeprazole  40 mg daily and told to follow-up as needed.  He was deemed to have aged out of surveillance endoscopy and colonoscopy.  In addition to being seen today regarding his chronic GERD and medication refill, he states that his primary care provider wanted him to see me regarding a possible need for colonoscopy (surveillance).  As long as the patient takes his PPI, no reflux symptoms.  Significant symptoms off medication.  No dysphagia.  No lower GI complaints.  No bleeding.  Review of blood work shows normal hemoglobin.  Does have an element of renal insufficiency.  I was sorry to hear that his wife Tilton recently passed away  REVIEW OF SYSTEMS:  All non-GI ROS negative except for left shoulder pain (recent traumatic fall)  Past Medical History:  Diagnosis Date   Anemia    pt denies this   Anxiety    Arthritis    legs and feet (06/10/2013)   Barrett's esophagus    never dilated (06/10/2013)   Cataract    Chronic kidney disease    stage 3   Colon polyps    tubular adenoma-last colon 12/18/2005   Depression    Diverticulosis    GERD (gastroesophageal reflux disease)    takes omeprazole    Hematoma    on mid back   Hiatal hernia    High cholesterol    Hypertension    Hypothyroidism    takes synthroid    Peripheral vascular disease     Past Surgical History:  Procedure Laterality Date   CARDIAC CATHETERIZATION     CATARACT EXTRACTION W/ INTRAOCULAR LENS  IMPLANT, BILATERAL Bilateral    COLONOSCOPY     CORONARY PRESSURE/FFR STUDY N/A 09/04/2023   Procedure: CORONARY PRESSURE/FFR STUDY;   Surgeon: Mady Bruckner, MD;  Location: [facility] INVASIVE CV LAB;  Service: Cardiovascular;  Laterality: N/A;   EYE SURGERY     FEMORAL-POPLITEAL BYPASS GRAFT Left 11/27/2012   Procedure: BYPASS GRAFT LEFT ABOVE KNEE TO BELOW KNEE POPLITEAL ARTERY USING LEFT NONREVERSED GREATER SAPPHENOUS VEIN;  Surgeon: Gaile LELON New, MD;  Location: [facility] OR;  Service: Vascular;  Laterality: Left;   I & D EXTREMITY Left 06/10/2013   leg; from the OR in 11/2012   I & D EXTREMITY Left 06/10/2013   Procedure: IRRIGATION AND DEBRIDEMENT OF LEFT UPPER LEG;  Surgeon: Gaile LELON New, MD;  Location: [facility] OR;  Service: Vascular;  Laterality: Left;   LOWER EXTREMITY ANGIOGRAM N/A 10/01/2012   Procedure: LOWER EXTREMITY ANGIOGRAM;  Surgeon: Candyce GORMAN Reek, MD;  Location: [facility] CATH LAB;  Service: Cardiovascular;  Laterality: N/A;   POLYPECTOMY     stomach   PYLOROPLASTY     RIGHT/LEFT HEART CATH AND CORONARY ANGIOGRAPHY N/A 09/04/2023   Procedure: RIGHT/LEFT HEART CATH AND CORONARY ANGIOGRAPHY;  Surgeon: Mady Bruckner, MD;  Location: [facility] INVASIVE CV LAB;  Service: Cardiovascular;  Laterality: N/A;   TONSILLECTOMY     TRANSURETHRAL RESECTION OF PROSTATE     UPPER GASTROINTESTINAL ENDOSCOPY      Social History BODIE ABERNETHY  reports that he quit smoking about 38 years ago. His smoking use included  cigarettes. He started smoking about 58 years ago. He has a 20 pack-year smoking history. He has never used smokeless tobacco. He reports that he does not drink alcohol and does not use drugs.  family history includes Alzheimer's disease in his sister; Heart disease in his mother and sister; Hypertension in his sister; Lung cancer in his brother; Melanoma in his father.  Allergies  Allergen Reactions   Penicillins Other (See Comments)    Boils       PHYSICAL EXAMINATION: Vital signs: BP 126/70   Pulse 78   Ht 5' 9 (1.753 m)   Wt 185 lb (83.9 kg)   BMI 27.32 kg/m   Constitutional: generally well-appearing, no acute  distress Psychiatric: alert and oriented x3, cooperative Eyes: extraocular movements intact, anicteric, conjunctiva pink Mouth: oral pharynx moist, no lesions Neck: supple no lymphadenopathy Cardiovascular: heart regular rate and rhythm, no murmur Lungs: clear to auscultation bilaterally Abdomen: soft, nontender, nondistended, no obvious ascites, no peritoneal signs, normal bowel sounds, no organomegaly Rectal: Omitted Extremities: no clubbing, cyanosis, or lower extremity edema bilaterally.  Bruising of the left upper extremity with decreased range of motion Skin: no lesions on visible extremities aside from bruising Neuro: No focal deficits.  Cranial nerves intact  ASSESSMENT:  1.  GERD with a history of nondysplastic Barrett's esophagus.  Currently asymptomatic on PPI.  Aged out of surveillance 2.  History of adenomatous colon polyps.  Aged out of surveillance 3.  History of chronic constipation.  No complaints currently 4.  Last upper endoscopy with biopsies September 2018..  Aged out of surveillance 5.  Last colonoscopy with polypectomy July 2018.  Aged out of surveillance   PLAN:  1.  Reflux precautions 2.  Refill omeprazole  40 mg daily.  Medication risk reviewed 3.  Resume general medical therapy.  GI follow-up as needed A total time of 45 minutes was spent preparing to see the patient (majority of the time), obtaining interval history, performing medically appropriate physical examination, counseling and educating the patient regarding above listed issues, ordering medication, and documenting clinical information in the health record

## 2023-12-09 NOTE — Patient Instructions (Addendum)
 We have sent the following medications to your pharmacy for you to pick up at your convenience:  Prilosec  _______________________________________________________  If your blood pressure at your visit was 140/90 or greater, please contact your primary care physician to follow up on this.  _______________________________________________________  If you are age 88 or older, your body mass index should be between 23-30. Your Body mass index is 27.32 kg/m. If this is out of the aforementioned range listed, please consider follow up with your Primary Care Provider.  If you are age 47 or younger, your body mass index should be between 19-25. Your Body mass index is 27.32 kg/m. If this is out of the aformentioned range listed, please consider follow up with your Primary Care Provider.   ________________________________________________________  The [facility] providers would like to encourage you to use MYCHART to communicate with providers for non-urgent requests or questions.  Due to long hold times on the telephone, sending your provider a message by [facility] may be a faster and more efficient way to get a response.  Please allow 48 business hours for a response.  Please remember that this is for non-urgent requests.  _______________________________________________________  [facility] is using a team-based approach to care.  Your team is made up of your doctor and two to three APPS. Our APPS (Nurse Practitioners and Physician Assistants) work with your physician to ensure care continuity for you. They are fully qualified to address your health concerns and develop a treatment plan. They communicate directly with your gastroenterologist to care for you. Seeing the Advanced Practice Practitioners on your physician's team can help you by facilitating care more promptly, often allowing for earlier appointments, access to diagnostic testing, procedures, and other specialty referrals.

## 2023-12-15 ENCOUNTER — Ambulatory Visit: Admitting: Student

## 2024-01-05 ENCOUNTER — Ambulatory Visit: Admitting: Podiatry

## 2024-01-21 ENCOUNTER — Ambulatory Visit: Admitting: Podiatry

## 2024-01-21 ENCOUNTER — Encounter: Payer: Self-pay | Admitting: Podiatry

## 2024-01-21 DIAGNOSIS — M79675 Pain in left toe(s): Secondary | ICD-10-CM | POA: Diagnosis not present

## 2024-01-21 DIAGNOSIS — B351 Tinea unguium: Secondary | ICD-10-CM | POA: Diagnosis not present

## 2024-01-21 DIAGNOSIS — M79674 Pain in right toe(s): Secondary | ICD-10-CM

## 2024-01-21 DIAGNOSIS — M79671 Pain in right foot: Secondary | ICD-10-CM

## 2024-01-21 NOTE — Progress Notes (Signed)
 Patient presents for evaluation and treatment of tenderness and some redness around nails feet.  Tenderness around toes with walking and wearing shoes.  Physical exam:  General appearance: Alert, pleasant, and in no acute distress.  Vascular: Pedal pulses: DP 2/4 B/L, PT 2/4 B/L. Mild edema lower legs bilaterally.  Capillary refill time immediate bilaterally  Neurologic:  Dermatologic:  Nails thickened, disfigured, discolored 1-5 BL with subungual debris.  Redness and hypertrophic nail folds along nail folds bilaterally but no signs of drainage or infection.  Musculoskeletal:     Diagnosis: 1. Painful onychomycotic nails 1 through 5 bilaterally. 2. Pain toes 1 through 5 bilaterally.  Plan: -Debrided onychomycotic nails 1 through 5 bilaterally.  Sharply debrided nails with nail clipper and reduced with a power bur.  Return 3 months RFC

## 2024-03-08 ENCOUNTER — Ambulatory Visit: Admitting: Internal Medicine

## 2024-04-20 ENCOUNTER — Ambulatory Visit: Admitting: Podiatry
